# Patient Record
Sex: Female | Born: 1985 | Race: White | Hispanic: Yes | Smoking: Never smoker
Health system: Southern US, Community
[De-identification: ages and names within clinical notes are randomized; demographics above are authoritative.]

## PROBLEM LIST (undated history)

## (undated) DIAGNOSIS — F329 Major depressive disorder, single episode, unspecified: Secondary | ICD-10-CM

## (undated) DIAGNOSIS — R609 Edema, unspecified: Secondary | ICD-10-CM

## (undated) DIAGNOSIS — M549 Dorsalgia, unspecified: Secondary | ICD-10-CM

## (undated) DIAGNOSIS — F419 Anxiety disorder, unspecified: Secondary | ICD-10-CM

## (undated) DIAGNOSIS — F32A Depression, unspecified: Secondary | ICD-10-CM

## (undated) DIAGNOSIS — K6389 Other specified diseases of intestine: Secondary | ICD-10-CM

## (undated) DIAGNOSIS — G43909 Migraine, unspecified, not intractable, without status migrainosus: Secondary | ICD-10-CM

## (undated) DIAGNOSIS — M2550 Pain in unspecified joint: Secondary | ICD-10-CM

## (undated) DIAGNOSIS — N946 Dysmenorrhea, unspecified: Secondary | ICD-10-CM

## (undated) DIAGNOSIS — R7303 Prediabetes: Secondary | ICD-10-CM

## (undated) DIAGNOSIS — E7800 Pure hypercholesterolemia, unspecified: Secondary | ICD-10-CM

## (undated) DIAGNOSIS — E669 Obesity, unspecified: Secondary | ICD-10-CM

## (undated) HISTORY — DX: Dorsalgia, unspecified: M54.9

## (undated) HISTORY — DX: Pain in unspecified joint: M25.50

## (undated) HISTORY — DX: Obesity, unspecified: E66.9

## (undated) HISTORY — DX: Other specified diseases of intestine: K63.89

## (undated) HISTORY — DX: Depression, unspecified: F32.A

## (undated) HISTORY — DX: Prediabetes: R73.03

## (undated) HISTORY — DX: Migraine, unspecified, not intractable, without status migrainosus: G43.909

## (undated) HISTORY — DX: Edema, unspecified: R60.9

## (undated) HISTORY — DX: Pure hypercholesterolemia, unspecified: E78.00

## (undated) HISTORY — DX: Major depressive disorder, single episode, unspecified: F32.9

## (undated) HISTORY — DX: Anxiety disorder, unspecified: F41.9

## (undated) HISTORY — DX: Dysmenorrhea, unspecified: N94.6

## (undated) HISTORY — PX: WISDOM TOOTH EXTRACTION: SHX21

---
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
## 2008-09-11 ENCOUNTER — Encounter: Payer: Self-pay | Admitting: Obstetrics & Gynecology

## 2008-09-11 ENCOUNTER — Ambulatory Visit: Payer: Self-pay | Admitting: Obstetrics & Gynecology

## 2008-09-11 ENCOUNTER — Other Ambulatory Visit: Admission: RE | Admit: 2008-09-11 | Discharge: 2008-09-11 | Payer: Self-pay | Admitting: Obstetrics & Gynecology

## 2008-10-13 ENCOUNTER — Ambulatory Visit: Payer: Self-pay | Admitting: Obstetrics & Gynecology

## 2009-02-03 ENCOUNTER — Ambulatory Visit: Payer: Self-pay | Admitting: Obstetrics & Gynecology

## 2009-04-02 ENCOUNTER — Ambulatory Visit: Payer: Self-pay | Admitting: Obstetrics & Gynecology

## 2009-07-15 ENCOUNTER — Ambulatory Visit: Payer: Self-pay | Admitting: Obstetrics & Gynecology

## 2009-11-10 ENCOUNTER — Ambulatory Visit: Payer: Self-pay | Admitting: Obstetrics & Gynecology

## 2009-11-10 ENCOUNTER — Other Ambulatory Visit: Admission: RE | Admit: 2009-11-10 | Discharge: 2009-11-10 | Payer: Self-pay | Admitting: Obstetrics & Gynecology

## 2009-11-13 ENCOUNTER — Ambulatory Visit (HOSPITAL_COMMUNITY): Admission: RE | Admit: 2009-11-13 | Discharge: 2009-11-13 | Payer: Self-pay | Admitting: Family Medicine

## 2009-11-30 ENCOUNTER — Ambulatory Visit: Payer: Self-pay | Admitting: Obstetrics and Gynecology

## 2009-11-30 LAB — CONVERTED CEMR LAB
AST: 11 units/L (ref 0–37)
Albumin: 4 g/dL (ref 3.5–5.2)
Alkaline Phosphatase: 22 units/L — ABNORMAL LOW (ref 39–117)
BUN: 15 mg/dL (ref 6–23)
CO2: 24 meq/L (ref 19–32)
Chloride: 104 meq/L (ref 96–112)
Creatinine, Ser: 0.86 mg/dL (ref 0.40–1.20)
HCT: 38.8 % (ref 36.0–46.0)
LDL Cholesterol: 91 mg/dL (ref 0–99)
Potassium: 4.4 meq/L (ref 3.5–5.3)
RBC: 4.59 M/uL (ref 3.87–5.11)
RDW: 13.4 % (ref 11.5–15.5)
Sodium: 138 meq/L (ref 135–145)
Total Bilirubin: 0.2 mg/dL — ABNORMAL LOW (ref 0.3–1.2)
Total CHOL/HDL Ratio: 3.5
Total Protein: 6.6 g/dL (ref 6.0–8.3)
Triglycerides: 126 mg/dL (ref <150)
VLDL: 25 mg/dL (ref 0–40)
WBC: 6.5 10*3/uL (ref 4.0–10.5)

## 2010-10-19 NOTE — Assessment & Plan Note (Signed)
NAME:  [name], [name] NO.:  [id number]   MEDICAL RECORD NO.:  [id number]          PATIENT TYPE:  POB   LOCATION:  [facility]         FACILITY:  [facility]   PHYSICIAN:  Allie Bossier, MD        DATE OF BIRTH:  03/16/1986   DATE OF SERVICE:  09/11/2008                                  CLINIC NOTE   Dominique Davis is a 25 year old single white gravida 0 who is coming for her  first ever Pap smear.  She is slightly nervous.  Her concern today is  that she is interested in starting on the NuvaRing for menstrual  regulation and birth control.  She is currently using condoms faithfully  and continues to do so as she describes her periods as monthly, but the  date is not consistent.  She does have some pain with her periods as  well.   PAST MEDICAL HISTORY:  She is slightly overweight.   ALLERGIES:  No known drug allergies.   PHYSICAL EXAMINATION:  GENERAL:  Slightly overweight.  HEENT:  Normal.  HEART:  Regular rate and rhythm.  LUNGS:  Clear to auscultation bilaterally.  ABDOMEN:  No hepatosplenomegaly.  EXTERNAL GENITALIA:  No lesions.  Cervix; normal discharge and  nulliparous.  Uterus is midplane, nonmobile, but nontender.  Adnexa  nontender.  No masses.   ASSESSMENT AND PLAN:  1. Annual exam.  I have checked Pap smear with cervical cultures.      Recommended self-breast and self-vulvar exams monthly.  2. For menstrual regulation and birth control, I have placed a      NuvaRing and I will check her blood pressure in 4 weeks.  I feel      that her blood pressure today was due to her nervousness and I will      make sure that it is normal at her next visit.      Allie Bossier, MD     MCD/MEDQ  D:  09/11/2008  T:  09/11/2008  Job:  [id number]

## 2010-10-19 NOTE — Assessment & Plan Note (Signed)
NAME:  Dominique Davis, Dominique Davis NO.:  [id number]   MEDICAL RECORD NO.:  [id number]          PATIENT TYPE:  POB   LOCATION:  [facility]         FACILITY:  [facility]   PHYSICIAN:  Jaynie Collins, MD     DATE OF BIRTH:  [birth year]   DATE OF SERVICE:  02/03/2009                                  CLINIC NOTE   The patient is a 25 year old gravida 0 who is coming in for  contraceptive counseling.  The patient has been on NuvaRing since April,  and she was put on this because of a self-reported history of  menometrorrhagia.  The patient does say that her bleeding is regular  right now.  However, she has a significant amount of cramping,  especially during her menstrual period which is not regulated by the  NuvaRing.  The patient was to see if there is any other thing that she  can use for birth control that can also regulate her cramping better.  She also reports having premenstrual syndrome symptoms, mood swings and  other things that she feels are not alleviated by the NuvaRing.  The  patient did try to use the NuvaRing continuously for 3 months period,  but she is not satisfied with that anymore and wants to consider other  options.  She does use condoms for sexually transmitted infection  prevention and is going to be starting on the Gardasil series today  after this encounter.   Physical examination is deferred.  The patient's vitals are stable and  she is afebrile.  She has no interval change in medical or surgical  history, and her last Pap smear was in September 11, 2008, which was normal.   ASSESSMENT AND PLAN:  The patient is a 25 year old gravida 0 here for a  contraceptive counseling, different molds for contraception including  oral hormonal methods, Implanon, the IUD, Depo-Provera were discussed  with the patient and the risks and benefits of all these modalities were  also discussed with the patient.  She is interested in oral  contraceptive pills at this point.   She has normal blood pressure and  has no contraindications to continue oral therapy until she was  counseled regarding using an extended regimen of oral contraceptive pill  such as Seasonique.  Information was given to the patient about  Seasonique and she was written a prescription for this medication.  As  for her cramping, the patient was told that she can take NSAIDs to help  with the cramping.  It was recommended that she take ibuprofen 600 mg  every 6 hours and she could start it about 2 days prior to her period  and also for the first 2 days prior to period, not to exceed a 7-day  therapy and not to exceed more than 2400 mg of ibuprofen daily.  The  other option, she was given a prescription and this was to try  diclofenac DR 75 mg p.o. b.i.d., also taking during the same time frame  that would help with the inflammation during her period and help with  her dysmenorrhea.  The patient was told that if her pain does continue  after she continues with  the extended regimen of oral contraceptive  pills that she might need evaluation such as a diagnostic laparoscopy to  rule out endometriosis.  The patient does note that in starting an  extended regimen and for the first few months she might have irregular  spotting or bleeding and she was told that this was a normal side effect  of this regimen.  She would call or come back in if she has further pain  or any other gynecologic symptoms.  Her next annual examination is due  in April 2011.           ______________________________  Jaynie Collins, MD     UA/MEDQ  D:  02/03/2009  T:  02/04/2009  Job:  [id number]

## 2010-10-19 NOTE — Assessment & Plan Note (Signed)
NAME:  Dominique, Davis NO.:  [id number]   MEDICAL RECORD NO.:  [id number]          PATIENT TYPE:  POB   LOCATION:  [facility]         FACILITY:  [facility]   PHYSICIAN:  Allie Bossier, MD        DATE OF BIRTH:  [birth year]   DATE OF SERVICE:  11/10/2009                                  CLINIC NOTE   Dominique Davis is a 25 year old single white gravida 0 who comes in here for  an annual exam.  Her recurrent complaint is that when she has her  periods, she has pain.  Dr. Macon Large saw her last summer and started her  on Seasonique, so that she would only have a period every 3 months.  She  does only have a small period every 3 months, but she does describe some  PMS symptoms with this withdrawal bleed as well as pretty severe  cramping and pain.  We discussed at length that she does not have  bleeding when she is on the active pills and therefore she does not have  pain or her mood changes.   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  She has been monogamous for the last 2 years with  the boyfriend she brought to her pelvic exam today.  She denies  dyspareunia and she has had her Gardasil series.   PREVIOUS SURGERIES:  None.   FAMILY HISTORY:  Negative for breast, GYN, and colon malignancies, but  she is concerned because her mother and maternal aunt have fibroids.   No latex allergies.   No known drug allergies.   SOCIAL HISTORY:  She drinks alcohol on occasion, denies tobacco, or  illegal drug use.   MEDICATIONS:  Seasonique daily.  She denies other medicine use.   PHYSICAL EXAMINATION:  GENERAL:  Well-nourished, well-hydrated white  female with her boyfriend present for the history part of the exam, but  he was absent for the physical.  VITAL SIGNS:  Height 5 feet 7 inches, weight 192, blood pressure 123/82,  pulse 78.  HEENT:  Normal.  HEART:  Regular rate and rhythm.  LUNGS:  Clear to auscultation  bilaterally.  BREASTS:  Normal bilaterally.  ABDOMEN:  Obese.  No palpable hepatosplenomegaly.  EXTERNAL GENITALIA:  Shaved.  No lesions.  Please note that on exam, she  has nipple rings and piercing in her umbilicus.  Cervix nulliparous.  Normal discharge.  No cervical motion tenderness.  Uterus mid plane,  minimally mobile.  No masses.  Adnexa nontender.  No masses.   ASSESSMENT AND PLAN:  1. Annual exam.  I checked a Pap smear, recommended self-breast and      self vulvar exams.  I will check cultures along with the Pap smear.  2. Dysmenorrhea.  Since she only has her mood and physical symptoms      (pain) with the withdrawal bleed every 3 months, I have recommended      she avoid the withdrawal bleed by always taking an active pill.      She and her boyfriend understand this, do not have any questions,  and agreed to this plan.  Because of her concern about familial      fibroids and her history of pain, I have agreed to order a GYN      ultrasound.  If there are issues with this, I will call her.      Allie Bossier, MD     MCD/MEDQ  D:  11/10/2009  T:  11/10/2009  Job:  [id number]

## 2010-12-22 ENCOUNTER — Other Ambulatory Visit (HOSPITAL_COMMUNITY)
Admission: RE | Admit: 2010-12-22 | Discharge: 2010-12-22 | Disposition: A | Discharge status: Home / Self Care | Payer: Self-pay | Source: Ambulatory Visit | Attending: Obstetrics and Gynecology | Admitting: Obstetrics and Gynecology

## 2010-12-22 ENCOUNTER — Other Ambulatory Visit: Payer: Self-pay | Admitting: Adult Health

## 2010-12-22 DIAGNOSIS — Z01419 Encounter for gynecological examination (general) (routine) without abnormal findings: Secondary | ICD-10-CM | POA: Insufficient documentation

## 2010-12-22 DIAGNOSIS — Z113 Encounter for screening for infections with a predominantly sexual mode of transmission: Secondary | ICD-10-CM | POA: Insufficient documentation

## 2011-08-18 ENCOUNTER — Telehealth (HOSPITAL_COMMUNITY): Payer: Self-pay | Admitting: Dietician

## 2011-08-18 NOTE — Telephone Encounter (Signed)
Received referral from [facility] for dx: obesity on 08/16/11.

## 2011-08-25 NOTE — Telephone Encounter (Signed)
Sent letter to pt home via US Mail in attempt to contact pt to schedule appointment.  

## 2011-08-30 ENCOUNTER — Ambulatory Visit (INDEPENDENT_AMBULATORY_CARE_PROVIDER_SITE_OTHER): Payer: PRIVATE HEALTH INSURANCE | Admitting: Nurse Practitioner

## 2011-08-30 ENCOUNTER — Encounter: Payer: Self-pay | Admitting: Nurse Practitioner

## 2011-08-30 VITALS — BP 122/75 | HR 90 | Ht 67.0 in | Wt 204.0 lb

## 2011-08-30 DIAGNOSIS — B373 Candidiasis of vulva and vagina: Secondary | ICD-10-CM

## 2011-08-30 DIAGNOSIS — N390 Urinary tract infection, site not specified: Secondary | ICD-10-CM

## 2011-08-30 DIAGNOSIS — Z6829 Body mass index (BMI) 29.0-29.9, adult: Secondary | ICD-10-CM | POA: Insufficient documentation

## 2011-08-30 DIAGNOSIS — B3731 Acute candidiasis of vulva and vagina: Secondary | ICD-10-CM

## 2011-08-30 DIAGNOSIS — E669 Obesity, unspecified: Secondary | ICD-10-CM

## 2011-08-30 HISTORY — DX: Body mass index (BMI) 29.0-29.9, adult: Z68.29

## 2011-08-30 LAB — POCT URINALYSIS DIPSTICK
Bilirubin, UA: NEGATIVE
Glucose, UA: NEGATIVE
Spec Grav, UA: 1.015
Urobilinogen, UA: 0.2
pH, UA: 5

## 2011-08-30 MED ORDER — FLUCONAZOLE 100 MG PO TABS: 100.0000 mg | ORAL_TABLET | Freq: Every day | ORAL | Status: AC

## 2011-08-30 MED ORDER — CIPROFLOXACIN HCL 500 MG PO TABS: 500.0000 mg | ORAL_TABLET | Freq: Two times a day (BID) | ORAL | Status: AC

## 2011-08-30 NOTE — Patient Instructions (Signed)

## 2011-08-30 NOTE — Progress Notes (Signed)
S: Pt in office today for sx of UTI that have been ongoing x 5 days. C/O low abd discomfort, back discomfort, ? Chills. No new sexual partner. She is on continuous BCPs and is not having menses now. Last seen in this office 2 years ago. Did get her annual exam at new office in [place]. Usually gets vaginal yeast infection with antibiotics.  Labs: urine positive for blood. Culture will be done  O: well developed, well nourished caucasian female in NAD. Skin warm and dry. Not acutely ill. Positive for CVA tenderness only.  A: UTI Presumptive vaginal yeast  P: Cipro 500 mg bid x 3 days. Asked to increase fluids and not drink soda. Will give Diflucan for presumptive yeast. Asked to RTC in June/ July for annual exam

## 2011-09-01 NOTE — Telephone Encounter (Signed)
Sent letter to pt home via US Mail in attempt to contact pt to schedule appointment.  

## 2011-09-02 LAB — URINE CULTURE

## 2011-09-07 NOTE — Telephone Encounter (Signed)
Pt has not responded to attempts to contact to schedule appointment. Referral filed.  

## 2011-09-08 ENCOUNTER — Telehealth (HOSPITAL_COMMUNITY): Payer: Self-pay | Admitting: Dietician

## 2011-09-08 ENCOUNTER — Encounter (HOSPITAL_COMMUNITY): Payer: Self-pay | Admitting: Dietician

## 2011-09-08 NOTE — Progress Notes (Signed)
Sent appointment verification letter to pt home via US Mail.  

## 2011-09-08 NOTE — Telephone Encounter (Signed)
Appointment scheduled for 09/14/11 at 11:30 AM.

## 2011-09-14 ENCOUNTER — Encounter (HOSPITAL_COMMUNITY): Payer: Self-pay | Admitting: Dietician
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
## 2012-01-03 ENCOUNTER — Ambulatory Visit (INDEPENDENT_AMBULATORY_CARE_PROVIDER_SITE_OTHER): Payer: PRIVATE HEALTH INSURANCE | Admitting: Obstetrics and Gynecology

## 2012-01-03 VITALS — BP 110/64 | HR 73 | Ht 67.0 in | Wt 188.0 lb

## 2012-01-03 DIAGNOSIS — Z124 Encounter for screening for malignant neoplasm of cervix: Secondary | ICD-10-CM

## 2012-01-03 DIAGNOSIS — Z01419 Encounter for gynecological examination (general) (routine) without abnormal findings: Secondary | ICD-10-CM

## 2012-01-03 MED ORDER — NORETHIN-ETH ESTRAD-FE BIPHAS 1 MG-10 MCG / 10 MCG PO TABS: 1.0000 | ORAL_TABLET | Freq: Every day | ORAL | Status: DC

## 2012-01-03 NOTE — Progress Notes (Signed)
  Subjective:     Dominique Davis is a 26 y.o. female with BMI 29 and is here for a comprehensive physical exam. The patient reports no problems. Patient using Lo Loestrin for birth control and she is using it continuously. Patient is happy with her current method. She is a monogamous relationship and declined STD testing.  History   Social History  . Marital Status: Single    Spouse Name: N/A    Number of Children: N/A  . Years of Education: N/A   Occupational History  . Not on file.   Social History Main Topics  . Smoking status: Never Smoker   . Smokeless tobacco: Not on file  . Alcohol Use: Yes     rare  . Drug Use: No  . Sexually Active: Yes -- Female partner(s)    Birth Control/ Protection: Pill   Other Topics Concern  . Not on file   Social History Narrative  . No narrative on file   Health Maintenance  Topic Date Due  . Tetanus/tdap  12/22/2005  . Influenza Vaccine  03/06/2012  . Pap Smear  12/21/2013       Review of Systems A comprehensive review of systems was negative.   Objective:     GENERAL: Well-developed, well-nourished female in no acute distress.  HEENT: Normocephalic, atraumatic. Sclerae anicteric.  NECK: Supple. Normal thyroid.  LUNGS: Clear to auscultation bilaterally.  HEART: Regular rate and rhythm. BREASTS: Symmetric in size. No palpable masses or lymphadenopathy, skin changes, or nipple drainage. ABDOMEN: Soft, nontender, nondistended. No organomegaly. PELVIC: Normal external female genitalia. Vagina is pink and rugated.  Normal discharge. Normal appearing cervix. Uterus is normal in size. No adnexal mass or tenderness. EXTREMITIES: No cyanosis, clubbing, or edema, 2+ distal pulses.    Assessment:    Healthy female exam.      Plan:    Pap smear collected Patient advised to perform monthly self-breast and vulva exams Rx refill for OCP provided See After Visit Summary for Counseling Recommendations

## 2012-01-03 NOTE — Patient Instructions (Signed)
Preventive Care for Adults, Female A healthy lifestyle and preventive care can promote health and wellness. Preventive health guidelines for women include the following key practices.  A routine yearly physical is a good way to check with your caregiver about your health and preventive screening. It is a chance to share any concerns and updates on your health, and to receive a thorough exam.   Visit your dentist for a routine exam and preventive care every 6 months. Brush your teeth twice a day and floss once a day. Good oral hygiene prevents tooth decay and gum disease.   The frequency of eye exams is based on your age, health, family medical history, use of contact lenses, and other factors. Follow your caregiver's recommendations for frequency of eye exams.   Eat a healthy diet. Foods like vegetables, fruits, whole grains, low-fat dairy products, and lean protein foods contain the nutrients you need without too many calories. Decrease your intake of foods high in solid fats, added sugars, and salt. Eat the right amount of calories for you.Get information about a proper diet from your caregiver, if necessary.   Regular physical exercise is one of the most important things you can do for your health. Most adults should get at least 150 minutes of moderate-intensity exercise (any activity that increases your heart rate and causes you to sweat) each week. In addition, most adults need muscle-strengthening exercises on 2 or more days a week.   Maintain a healthy weight. The body mass index (BMI) is a screening tool to identify possible weight problems. It provides an estimate of body fat based on height and weight. Your caregiver can help determine your BMI, and can help you achieve or maintain a healthy weight.For adults 20 years and older:   A BMI below 18.5 is considered underweight.   A BMI of 18.5 to 24.9 is normal.   A BMI of 25 to 29.9 is considered overweight.   A BMI of 30 and above is  considered obese.   Maintain normal blood lipids and cholesterol levels by exercising and minimizing your intake of saturated fat. Eat a balanced diet with plenty of fruit and vegetables. Blood tests for lipids and cholesterol should begin at age 20 and be repeated every 5 years. If your lipid or cholesterol levels are high, you are over 50, or you are at high risk for heart disease, you may need your cholesterol levels checked more frequently.Ongoing high lipid and cholesterol levels should be treated with medicines if diet and exercise are not effective.   If you smoke, find out from your caregiver how to quit. If you do not use tobacco, do not start.   If you are pregnant, do not drink alcohol. If you are breastfeeding, be very cautious about drinking alcohol. If you are not pregnant and choose to drink alcohol, do not exceed 1 drink per day. One drink is considered to be 12 ounces (355 mL) of beer, 5 ounces (148 mL) of wine, or 1.5 ounces (44 mL) of liquor.   Avoid use of street drugs. Do not share needles with anyone. Ask for help if you need support or instructions about stopping the use of drugs.   High blood pressure causes heart disease and increases the risk of stroke. Your blood pressure should be checked at least every 1 to 2 years. Ongoing high blood pressure should be treated with medicines if weight loss and exercise are not effective.   If you are 55 to 25   years old, ask your caregiver if you should take aspirin to prevent strokes.   Diabetes screening involves taking a blood sample to check your fasting blood sugar level. This should be done once every 3 years, after age 45, if you are within normal weight and without risk factors for diabetes. Testing should be considered at a younger age or be carried out more frequently if you are overweight and have at least 1 risk factor for diabetes.   Breast cancer screening is essential preventive care for women. You should practice "breast  self-awareness." This means understanding the normal appearance and feel of your breasts and may include breast self-examination. Any changes detected, no matter how small, should be reported to a caregiver. Women in their 20s and 30s should have a clinical breast exam (CBE) by a caregiver as part of a regular health exam every 1 to 3 years. After age 40, women should have a CBE every year. Starting at age 40, women should consider having a mammography (breast X-ray test) every year. Women who have a family history of breast cancer should talk to their caregiver about genetic screening. Women at a high risk of breast cancer should talk to their caregivers about having magnetic resonance imaging (MRI) and a mammography every year.   The Pap test is a screening test for cervical cancer. A Pap test can show cell changes on the cervix that might become cervical cancer if left untreated. A Pap test is a procedure in which cells are obtained and examined from the lower end of the uterus (cervix).   Women should have a Pap test starting at age 21.   Between ages 21 and 29, Pap tests should be repeated every 2 years.   Beginning at age 30, you should have a Pap test every 3 years as long as the past 3 Pap tests have been normal.   Some women have medical problems that increase the chance of getting cervical cancer. Talk to your caregiver about these problems. It is especially important to talk to your caregiver if a new problem develops soon after your last Pap test. In these cases, your caregiver may recommend more frequent screening and Pap tests.   The above recommendations are the same for women who have or have not gotten the vaccine for human papillomavirus (HPV).   If you had a hysterectomy for a problem that was not cancer or a condition that could lead to cancer, then you no longer need Pap tests. Even if you no longer need a Pap test, a regular exam is a good idea to make sure no other problems are  starting.   If you are between ages 65 and 70, and you have had normal Pap tests going back 10 years, you no longer need Pap tests. Even if you no longer need a Pap test, a regular exam is a good idea to make sure no other problems are starting.   If you have had past treatment for cervical cancer or a condition that could lead to cancer, you need Pap tests and screening for cancer for at least 20 years after your treatment.   If Pap tests have been discontinued, risk factors (such as a new sexual partner) need to be reassessed to determine if screening should be resumed.   The HPV test is an additional test that may be used for cervical cancer screening. The HPV test looks for the virus that can cause the cell changes on the cervix.   The cells collected during the Pap test can be tested for HPV. The HPV test could be used to screen women aged 30 years and older, and should be used in women of any age who have unclear Pap test results. After the age of 30, women should have HPV testing at the same frequency as a Pap test.   Colorectal cancer can be detected and often prevented. Most routine colorectal cancer screening begins at the age of 50 and continues through age 75. However, your caregiver may recommend screening at an earlier age if you have risk factors for colon cancer. On a yearly basis, your caregiver may provide home test kits to check for hidden blood in the stool. Use of a small camera at the end of a tube, to directly examine the colon (sigmoidoscopy or colonoscopy), can detect the earliest forms of colorectal cancer. Talk to your caregiver about this at age 50, when routine screening begins. Direct examination of the colon should be repeated every 5 to 10 years through age 75, unless early forms of pre-cancerous polyps or small growths are found.   Hepatitis C blood testing is recommended for all people born from 1945 through 1965 and any individual with known risks for hepatitis C.    Practice safe sex. Use condoms and avoid high-risk sexual practices to reduce the spread of sexually transmitted infections (STIs). STIs include gonorrhea, chlamydia, syphilis, trichomonas, herpes, HPV, and human immunodeficiency virus (HIV). Herpes, HIV, and HPV are viral illnesses that have no cure. They can result in disability, cancer, and death. Sexually active women aged 25 and younger should be checked for chlamydia. Older women with new or multiple partners should also be tested for chlamydia. Testing for other STIs is recommended if you are sexually active and at increased risk.   Osteoporosis is a disease in which the bones lose minerals and strength with aging. This can result in serious bone fractures. The risk of osteoporosis can be identified using a bone density scan. Women ages 65 and over and women at risk for fractures or osteoporosis should discuss screening with their caregivers. Ask your caregiver whether you should take a calcium supplement or vitamin D to reduce the rate of osteoporosis.   Menopause can be associated with physical symptoms and risks. Hormone replacement therapy is available to decrease symptoms and risks. You should talk to your caregiver about whether hormone replacement therapy is right for you.   Use sunscreen with sun protection factor (SPF) of 30 or more. Apply sunscreen liberally and repeatedly throughout the day. You should seek shade when your shadow is shorter than you. Protect yourself by wearing long sleeves, pants, a wide-brimmed hat, and sunglasses year round, whenever you are outdoors.   Once a month, do a whole body skin exam, using a mirror to look at the skin on your back. Notify your caregiver of new moles, moles that have irregular borders, moles that are larger than a pencil eraser, or moles that have changed in shape or color.   Stay current with required immunizations.   Influenza. You need a dose every fall (or winter). The composition of  the flu vaccine changes each year, so being vaccinated once is not enough.   Pneumococcal polysaccharide. You need 1 to 2 doses if you smoke cigarettes or if you have certain chronic medical conditions. You need 1 dose at age 65 (or older) if you have never been vaccinated.   Tetanus, diphtheria, pertussis (Tdap, Td). Get 1 dose of   Tdap vaccine if you are younger than age 65, are over 65 and have contact with an infant, are a healthcare worker, are pregnant, or simply want to be protected from whooping cough. After that, you need a Td booster dose every 10 years. Consult your caregiver if you have not had at least 3 tetanus and diphtheria-containing shots sometime in your life or have a deep or dirty wound.   HPV. You need this vaccine if you are a woman age 26 or younger. The vaccine is given in 3 doses over 6 months.   Measles, mumps, rubella (MMR). You need at least 1 dose of MMR if you were born in 1957 or later. You may also need a second dose.   Meningococcal. If you are age 19 to 21 and a first-year college student living in a residence hall, or have one of several medical conditions, you need to get vaccinated against meningococcal disease. You may also need additional booster doses.   Zoster (shingles). If you are age 60 or older, you should get this vaccine.   Varicella (chickenpox). If you have never had chickenpox or you were vaccinated but received only 1 dose, talk to your caregiver to find out if you need this vaccine.   Hepatitis A. You need this vaccine if you have a specific risk factor for hepatitis A virus infection or you simply wish to be protected from this disease. The vaccine is usually given as 2 doses, 6 to 18 months apart.   Hepatitis B. You need this vaccine if you have a specific risk factor for hepatitis B virus infection or you simply wish to be protected from this disease. The vaccine is given in 3 doses, usually over 6 months.  Preventive Services /  Frequency Ages 19 to 39  Blood pressure check.** / Every 1 to 2 years.   Lipid and cholesterol check.** / Every 5 years beginning at age 20.   Clinical breast exam.** / Every 3 years for women in their 20s and 30s.   Pap test.** / Every 2 years from ages 21 through 29. Every 3 years starting at age 30 through age 65 or 70 with a history of 3 consecutive normal Pap tests.   HPV screening.** / Every 3 years from ages 30 through ages 65 to 70 with a history of 3 consecutive normal Pap tests.   Hepatitis C blood test.** / For any individual with known risks for hepatitis C.   Skin self-exam. / Monthly.   Influenza immunization.** / Every year.   Pneumococcal polysaccharide immunization.** / 1 to 2 doses if you smoke cigarettes or if you have certain chronic medical conditions.   Tetanus, diphtheria, pertussis (Tdap, Td) immunization. / A one-time dose of Tdap vaccine. After that, you need a Td booster dose every 10 years.   HPV immunization. / 3 doses over 6 months, if you are 26 and younger.   Measles, mumps, rubella (MMR) immunization. / You need at least 1 dose of MMR if you were born in 1957 or later. You may also need a second dose.   Meningococcal immunization. / 1 dose if you are age 19 to 21 and a first-year college student living in a residence hall, or have one of several medical conditions, you need to get vaccinated against meningococcal disease. You may also need additional booster doses.   Varicella immunization.** / Consult your caregiver.   Hepatitis A immunization.** / Consult your caregiver. 2 doses, 6 to 18 months   apart.   Hepatitis B immunization.** / Consult your caregiver. 3 doses usually over 6 months.    ** Family history and personal history of risk and conditions may change your caregiver's recommendations. Document Released: 07/19/2001 Document Revised: 05/12/2011 Document Reviewed: 10/18/2010 [employer] Patient Information 2012 [employer]. 

## 2012-12-24 ENCOUNTER — Telehealth: Payer: Self-pay | Admitting: *Deleted

## 2012-12-24 NOTE — Telephone Encounter (Signed)
Pt called and is having symptoms of a yeast infection.  Feeling very itchy and having white discharge.  I called in Diflucan 150 mg tablets-take one and repeat in 2-3 days if symptoms persist.  #2 with no refills.  Pt has scheduled a physical for 01/07/13.

## 2013-01-07 ENCOUNTER — Encounter: Payer: Self-pay | Admitting: Obstetrics & Gynecology

## 2013-01-07 ENCOUNTER — Ambulatory Visit (INDEPENDENT_AMBULATORY_CARE_PROVIDER_SITE_OTHER): Payer: BC Managed Care – PPO | Admitting: Obstetrics & Gynecology

## 2013-01-07 VITALS — BP 122/91 | HR 69 | Ht 67.0 in | Wt 201.0 lb

## 2013-01-07 DIAGNOSIS — Z0000 Encounter for general adult medical examination without abnormal findings: Secondary | ICD-10-CM

## 2013-01-07 DIAGNOSIS — Z1151 Encounter for screening for human papillomavirus (HPV): Secondary | ICD-10-CM

## 2013-01-07 DIAGNOSIS — Z01419 Encounter for gynecological examination (general) (routine) without abnormal findings: Secondary | ICD-10-CM

## 2013-01-07 DIAGNOSIS — Z124 Encounter for screening for malignant neoplasm of cervix: Secondary | ICD-10-CM

## 2013-01-07 DIAGNOSIS — Z3041 Encounter for surveillance of contraceptive pills: Secondary | ICD-10-CM

## 2013-01-07 NOTE — Progress Notes (Signed)
Subjective:    Dominique Davis is a 27 y.o. female who presents for an annual exam. She is concerned about weight gain (20# in last year).  The patient is sexually active. GYN screening history: last pap: was normal. The patient wears seatbelts: yes. The patient participates in regular exercise: not asked. Has the patient ever been transfused or tattooed?: yes. The patient reports that there is not domestic violence in her life.   Menstrual History: OB History   Grav Para Term Preterm Abortions TAB SAB Ect Mult Living   0 0 0 0 0 0 0 0 0 0        No LMP recorded. Patient is not currently having periods (Reason: Oral contraceptives).    The following portions of the patient's history were reviewed and updated as appropriate: allergies, current medications, past family history, past medical history, past social history, past surgical history and problem list.  Review of Systems A comprehensive review of systems was negative. Lives with her boyfriend of 5 years, she is in school at [employer], graduating this December. She works as a Building surveyor.   Objective:    BP 122/91  Pulse 69  Ht 5\' 7"  (1.702 m)  Wt 201 lb (91.173 kg)  BMI 31.47 kg/m2  General Appearance:    Alert, cooperative, no distress, appears stated age  Head:    Normocephalic, without obvious abnormality, atraumatic  Eyes:    PERRL, conjunctiva/corneas clear, EOM's intact, fundi    benign, both eyes  Ears:    Normal TM's and external ear canals, both ears  Nose:   Nares normal, septum midline, mucosa normal, no drainage    or sinus tenderness  Throat:   Lips, mucosa, and tongue normal; teeth and gums normal  Neck:   Supple, symmetrical, trachea midline, no adenopathy;    thyroid:  no enlargement/tenderness/nodules; no carotid   bruit or JVD  Back:     Symmetric, no curvature, ROM normal, no CVA tenderness  Lungs:     Clear to auscultation bilaterally, respirations unlabored  Chest Wall:    No  tenderness or deformity   Heart:    Regular rate and rhythm, S1 and S2 normal, no murmur, rub   or gallop  Breast Exam:    No tenderness, masses, or nipple abnormality  Abdomen:     Soft, non-tender, bowel sounds active all four quadrants,    no masses, no organomegaly  Genitalia:    Normal female without lesion, discharge or tenderness, NSSA, NT, normal adnexal exam     Extremities:   Extremities normal, atraumatic, no cyanosis or edema  Pulses:   2+ and symmetric all extremities  Skin:   Skin color, texture, turgor normal, no rashes or lesions  Lymph nodes:   Cervical, supraclavicular, and axillary nodes normal  Neurologic:   CNII-XII intact, normal strength, sensation and reflexes    throughout  .    Assessment:    Healthy female exam.  Weight gain   Plan:     Breast self exam technique reviewed and patient encouraged to perform self-exam monthly. Thin prep Pap smear.  RTC for fasting labs and TSH

## 2013-02-18 ENCOUNTER — Telehealth: Payer: Self-pay | Admitting: *Deleted

## 2013-02-18 DIAGNOSIS — IMO0001 Reserved for inherently not codable concepts without codable children: Secondary | ICD-10-CM

## 2013-02-18 MED ORDER — NORETHIN-ETH ESTRAD-FE BIPHAS 1 MG-10 MCG / 10 MCG PO TABS: 1.0000 | ORAL_TABLET | Freq: Every day | ORAL | Status: DC

## 2013-02-18 NOTE — Telephone Encounter (Signed)
Patient needs a years refill of her ocp.  Meds called to her pharmacy.

## 2013-03-11 ENCOUNTER — Other Ambulatory Visit (INDEPENDENT_AMBULATORY_CARE_PROVIDER_SITE_OTHER): Payer: BC Managed Care – PPO | Admitting: *Deleted

## 2013-03-11 DIAGNOSIS — Z833 Family history of diabetes mellitus: Secondary | ICD-10-CM

## 2013-03-11 DIAGNOSIS — Z1329 Encounter for screening for other suspected endocrine disorder: Secondary | ICD-10-CM

## 2013-03-11 LAB — HEMOGLOBIN A1C: Mean Plasma Glucose: 105 mg/dL (ref <117)

## 2013-04-11 ENCOUNTER — Other Ambulatory Visit: Payer: Self-pay

## 2013-04-26 ENCOUNTER — Ambulatory Visit (INDEPENDENT_AMBULATORY_CARE_PROVIDER_SITE_OTHER): Payer: BC Managed Care – PPO | Admitting: Family Medicine

## 2013-04-26 ENCOUNTER — Encounter: Payer: Self-pay | Admitting: Family Medicine

## 2013-04-26 VITALS — BP 122/68 | HR 84 | Temp 98.7°F | Resp 18 | Ht 66.0 in | Wt 205.0 lb

## 2013-04-26 DIAGNOSIS — G43909 Migraine, unspecified, not intractable, without status migrainosus: Secondary | ICD-10-CM

## 2013-04-26 DIAGNOSIS — M549 Dorsalgia, unspecified: Secondary | ICD-10-CM

## 2013-04-26 DIAGNOSIS — F411 Generalized anxiety disorder: Secondary | ICD-10-CM

## 2013-04-26 DIAGNOSIS — Z23 Encounter for immunization: Secondary | ICD-10-CM

## 2013-04-26 DIAGNOSIS — E669 Obesity, unspecified: Secondary | ICD-10-CM

## 2013-04-26 DIAGNOSIS — G8929 Other chronic pain: Secondary | ICD-10-CM

## 2013-04-26 MED ORDER — FLUTICASONE PROPIONATE 50 MCG/ACT NA SUSP: 2.0000 | Freq: Every day | NASAL | Status: DC

## 2013-04-26 MED ORDER — SUMATRIPTAN SUCCINATE 50 MG PO TABS: 50.0000 mg | ORAL_TABLET | ORAL | Status: DC | PRN

## 2013-04-26 MED ORDER — KETOROLAC TROMETHAMINE 60 MG/2ML IM SOLN
60.0000 mg | Freq: Once | INTRAMUSCULAR | Status: AC
  Administered 2013-04-26: 60 mg via INTRAMUSCULAR

## 2013-04-26 MED ORDER — PHENTERMINE HCL 37.5 MG PO TABS: 37.5000 mg | ORAL_TABLET | Freq: Every day | ORAL | Status: DC

## 2013-04-26 NOTE — Patient Instructions (Signed)
Try the phentermine start with 1/2 tablets for 2 weeks then increase to 1 tablet Flonase for allergies/rhinitis Imitrex as needed for migraine F/U 6 weeks -morning appt fasting

## 2013-04-27 ENCOUNTER — Encounter: Payer: Self-pay | Admitting: Family Medicine

## 2013-04-27 DIAGNOSIS — F411 Generalized anxiety disorder: Secondary | ICD-10-CM | POA: Insufficient documentation

## 2013-04-27 DIAGNOSIS — G43909 Migraine, unspecified, not intractable, without status migrainosus: Secondary | ICD-10-CM | POA: Insufficient documentation

## 2013-04-27 DIAGNOSIS — M549 Dorsalgia, unspecified: Secondary | ICD-10-CM | POA: Insufficient documentation

## 2013-04-27 DIAGNOSIS — G8929 Other chronic pain: Secondary | ICD-10-CM | POA: Insufficient documentation

## 2013-04-27 NOTE — Progress Notes (Signed)
  Subjective:    Patient ID: Dominique Davis, female    DOB: [birth year], 27 y.o.   MRN: [id number]  HPI  Patient here to establish care. She recently had a GYN physical done which was benign. She's not had a PCP since she was a teenager. Is concerned today about her headaches as well as her weight. She is being followed by chiropractor secondary to right lower back pain she was told she had acute scoliosis and a bulging disc. She's been followed by [facility] in [place]  Obese-he's currently at her heaviest weight- lowest weight was 170 pounds back in 2010. She's tried exercise on a regular basis as well as multiple diets. She's currently on the pain we have diet which he states has worked well for her and also helps her migraines. She feels that she needs to push and would like to start medication to help with her weight loss. She has had some baseline labs including a TSH which was normal  Migraine headache migraine headache for the past couple of days. She is history of migraines in the past she used medication. She initially thought this was sinuses therefore take Advil and Alka-Seltzer but had minimal improvement. She feels more pressure behind her eyes and a pounding headache. She's not had any change in her vision.-   Review of Systems  GEN- denies fatigue, fever, weight loss,weakness, recent illness HEENT- denies eye drainage, change in vision, nasal discharge, CVS- denies chest pain, palpitations RESP- denies SOB, cough, wheeze ABD- denies N/V, change in stools, abd pain GU- denies dysuria, hematuria, dribbling, incontinence MSK- denies joint pain, muscle aches, injury Neuro- +headache, dizziness, syncope, seizure activity      Objective:   Physical Exam GEN- NAD, alert and oriented x3, piercings noted HEENT- PERRL, EOMI, non injected sclera, pink conjunctiva, MMM, oropharynx clear Neck- Supple, no thyromegaly CVS- RRR, no murmur RESP-CTAB EXT- No  edema Pulses- Radial, DP-2+ Psych- Normal affect and mood        Assessment & Plan:

## 2013-04-27 NOTE — Assessment & Plan Note (Signed)
He has suffered from anxiety and depression over the years. She's not on any medications. She is very adverse to this because of her mothers history and use of multiple medications. At this time we'll follow her closely

## 2013-04-27 NOTE — Assessment & Plan Note (Signed)
She's been given a shot of Toradol today. I've also given her prescription for Imitrex to use as needed

## 2013-04-27 NOTE — Assessment & Plan Note (Signed)
Obtain records from chiropracter

## 2013-04-27 NOTE — Assessment & Plan Note (Signed)
Phentermine. She will start with half a tablet and then increase to one tablet after 2 weeks. She's to continue her pale he is diet and start an exercise routine. Discussed side effects of the medication Fasting labs will be done at her next visit

## 2013-06-07 ENCOUNTER — Ambulatory Visit: Payer: BC Managed Care – PPO | Admitting: Family Medicine

## 2013-06-14 ENCOUNTER — Encounter: Payer: Self-pay | Admitting: Family Medicine

## 2013-06-14 ENCOUNTER — Ambulatory Visit (INDEPENDENT_AMBULATORY_CARE_PROVIDER_SITE_OTHER): Payer: BC Managed Care – PPO | Admitting: Family Medicine

## 2013-06-14 VITALS — BP 110/60 | HR 76 | Temp 98.0°F | Resp 16 | Ht 67.0 in | Wt 198.0 lb

## 2013-06-14 DIAGNOSIS — Z13220 Encounter for screening for lipoid disorders: Secondary | ICD-10-CM

## 2013-06-14 DIAGNOSIS — G43909 Migraine, unspecified, not intractable, without status migrainosus: Secondary | ICD-10-CM

## 2013-06-14 DIAGNOSIS — E669 Obesity, unspecified: Secondary | ICD-10-CM

## 2013-06-14 LAB — COMPLETE METABOLIC PANEL WITH GFR
ALT: 9 U/L (ref 0–35)
AST: 13 U/L (ref 0–37)
Albumin: 3.8 g/dL (ref 3.5–5.2)
Alkaline Phosphatase: 33 U/L — ABNORMAL LOW (ref 39–117)
BILIRUBIN TOTAL: 0.4 mg/dL (ref 0.3–1.2)
BUN: 14 mg/dL (ref 6–23)
CALCIUM: 9 mg/dL (ref 8.4–10.5)
CO2: 25 mEq/L (ref 19–32)
CREATININE: 0.93 mg/dL (ref 0.50–1.10)
Chloride: 104 mEq/L (ref 96–112)
GFR, EST NON AFRICAN AMERICAN: 85 mL/min
GFR, Est African American: >89 mL/min
Glucose, Bld: 90 mg/dL (ref 70–99)
Potassium: 4.4 mEq/L (ref 3.5–5.3)
Sodium: 139 mEq/L (ref 135–145)
Total Protein: 6.6 g/dL (ref 6.0–8.3)

## 2013-06-14 LAB — LIPID PANEL
CHOL/HDL RATIO: 3.1 ratio
Cholesterol: 170 mg/dL (ref 0–200)
HDL: 54 mg/dL (ref >39)
LDL Cholesterol: 101 mg/dL — ABNORMAL HIGH (ref 0–99)
Triglycerides: 75 mg/dL (ref <150)
VLDL: 15 mg/dL (ref 0–40)

## 2013-06-14 LAB — CBC WITH DIFFERENTIAL/PLATELET
BASOS PCT: 1 % (ref 0–1)
Basophils Absolute: 0 10*3/uL (ref 0.0–0.1)
Eosinophils Absolute: 0.2 10*3/uL (ref 0.0–0.7)
Eosinophils Relative: 3 % (ref 0–5)
HEMATOCRIT: 43.1 % (ref 36.0–46.0)
HEMOGLOBIN: 14.4 g/dL (ref 12.0–15.0)
LYMPHS PCT: 33 % (ref 12–46)
Lymphs Abs: 1.5 10*3/uL (ref 0.7–4.0)
MCH: 28.3 pg (ref 26.0–34.0)
MCHC: 33.4 g/dL (ref 30.0–36.0)
MCV: 84.8 fL (ref 78.0–100.0)
MONO ABS: 0.3 10*3/uL (ref 0.1–1.0)
Monocytes Relative: 7 % (ref 3–12)
NEUTROS ABS: 2.5 10*3/uL (ref 1.7–7.7)
NEUTROS PCT: 56 % (ref 43–77)
PLATELETS: 253 10*3/uL (ref 150–400)
RBC: 5.08 MIL/uL (ref 3.87–5.11)
RDW: 13.1 % (ref 11.5–15.5)
WBC: 4.4 10*3/uL (ref 4.0–10.5)

## 2013-06-14 NOTE — Progress Notes (Signed)
   Subjective:    Patient ID: Dominique Davis, female    DOB: 06-11-1985, 28 y.o.   MRN: [id number]  HPI Please see the last office visit. The patient has been taking phentermine for approximately one month. She has lost 7 pounds since taking phentermine. She is exercising 5-6 days a week for one to 2 hours a day. She is trying to consent 1300-1500 calories per day. Overall she is doing well. She continues to experience migraines. She reports that they occur 4-5 times per month.  She has had them since childhood. They tend to be triggered by stress. Past Medical History  Diagnosis Date  . Dysmenorrhea   . Anxiety   . Obesity    Current Outpatient Prescriptions on File Prior to Visit  Medication Sig Dispense Refill  . fluticasone (FLONASE) 50 MCG/ACT nasal spray Place 2 sprays into both nostrils daily.  16 g  2  . Norethindrone-Ethinyl Estradiol-Fe Biphas (LO LOESTRIN FE) 1 MG-10 MCG / 10 MCG tablet Take 1 tablet by mouth daily.  1 Package  12  . phentermine (ADIPEX-P) 37.5 MG tablet Take 1 tablet (37.5 mg total) by mouth daily before breakfast.  30 tablet  1  . SUMAtriptan (IMITREX) 50 MG tablet Take 1 tablet (50 mg total) by mouth every 2 (two) hours as needed for migraine or headache. May repeat in 2 hours if headache persists or recurs.  10 tablet  0   No current facility-administered medications on file prior to visit.   No Known Allergies History   Social History  . Marital Status: Single    Spouse Name: N/A    Number of Children: N/A  . Years of Education: N/A   Occupational History  . Not on file.   Social History Main Topics  . Smoking status: Never Smoker   . Smokeless tobacco: Never Used  . Alcohol Use: Yes     Comment: rare  . Drug Use: No  . Sexual Activity: Yes    Partners: Male    Birth Control/ Protection: Pill   Other Topics Concern  . Not on file   Social History Narrative  . No narrative on file      Review of Systems  All other systems reviewed  and are negative.       Objective:   Physical Exam  Vitals reviewed. Constitutional: She is oriented to person, place, and time.  Neck: No thyromegaly present.  Cardiovascular: Normal rate, regular rhythm and normal heart sounds.  Exam reveals no friction rub.   No murmur heard. Pulmonary/Chest: Effort normal and breath sounds normal. No respiratory distress. She has no wheezes. She has no rales.  Abdominal: Soft. Bowel sounds are normal.  Neurological: She is alert and oriented to person, place, and time. She has normal reflexes. She displays normal reflexes. No cranial nerve deficit. She exhibits normal muscle tone. Coordination normal.          Assessment & Plan:  1. Screening cholesterol level - CBC with Differential - COMPLETE METABOLIC PANEL WITH GFR - Lipid panel  2. Obesity, unspecified Continue phentermine for a total of 3 months. I encouraged the patient to continue 5 days a week of one hour a day exercise and a 1500-calorie per day diet  3. Migraines Consider adding Topamax for migraine prevention and to assist with weight loss.  Patient would like to research this option and will consider it in the future.

## 2013-06-19 ENCOUNTER — Encounter: Payer: Self-pay | Admitting: Family Medicine

## 2013-06-21 ENCOUNTER — Telehealth: Payer: Self-pay | Admitting: Family Medicine

## 2013-06-21 NOTE — Telephone Encounter (Signed)
Pt had researched the medicationTopamax like she was going to do and she does have a few questions regarding the medication Call back number is [phone]

## 2013-06-24 NOTE — Telephone Encounter (Signed)
Pt is concerned about all the side effects that comes with taking Topamax which includes metabolic acidosis, reducing the effectiveness of birth control (and what should she do while on medication - double up on it?), Seizures after stopping the medication, ETC. She is wanting to know if the side effects out weight the benefit of the medication and wants to make sure that it is safe for her to take!?!?

## 2013-06-24 NOTE — Telephone Encounter (Signed)
LMTRC

## 2013-06-24 NOTE — Telephone Encounter (Signed)
Forward to PCP.

## 2013-06-25 NOTE — Telephone Encounter (Signed)
The risk for seizures on discontinuation applies only to patients who take the medicine to prevent seizures.  She would be fine.  The risk of metabolic acidosis is extremely low, easy to see on basic labwork and if it does occur correctable by discontinuing medicine.  The only real concern is the medicine slightly lowering the effectiveness of OCP's.  To be completely safe, she could use a back up method of bc such as condoms although I feel the risk is low.

## 2013-06-26 NOTE — Telephone Encounter (Signed)
LMTRC

## 2013-06-27 NOTE — Telephone Encounter (Signed)
Pt is returning your call  Call back number is [phone]

## 2013-06-28 NOTE — Telephone Encounter (Signed)
Call back number is incorrect. But pt is aware of the responses to her questions.

## 2013-08-13 ENCOUNTER — Ambulatory Visit (INDEPENDENT_AMBULATORY_CARE_PROVIDER_SITE_OTHER): Payer: BC Managed Care – PPO | Admitting: Family Medicine

## 2013-08-13 ENCOUNTER — Encounter: Payer: Self-pay | Admitting: Family Medicine

## 2013-08-13 VITALS — BP 110/64 | HR 78 | Temp 98.8°F | Resp 16 | Wt 198.0 lb

## 2013-08-13 DIAGNOSIS — G43909 Migraine, unspecified, not intractable, without status migrainosus: Secondary | ICD-10-CM

## 2013-08-13 DIAGNOSIS — R197 Diarrhea, unspecified: Secondary | ICD-10-CM

## 2013-08-13 MED ORDER — RIZATRIPTAN BENZOATE 10 MG PO TABS: 10.0000 mg | ORAL_TABLET | ORAL | Status: DC | PRN

## 2013-08-13 MED ORDER — TOPIRAMATE 25 MG PO TABS: 50.0000 mg | ORAL_TABLET | Freq: Two times a day (BID) | ORAL | Status: DC

## 2013-08-13 NOTE — Progress Notes (Signed)
Subjective:    Patient ID: Dominique Davis, female    DOB: 1986/01/19, 28 y.o.   MRN: [id number]  HPI 06/14/13 Please see the last office visit. The patient has been taking phentermine for approximately one month. She has lost 7 pounds since taking phentermine. She is exercising 5-6 days a week for one to 2 hours a day. She is trying to consent 1300-1500 calories per day. Overall she is doing well. She continues to experience migraines. She reports that they occur 4-5 times per month.  She has had them since childhood. They tend to be triggered by stress.  At that time, my plan was: 1. Screening cholesterol level - CBC with Differential - COMPLETE METABOLIC PANEL WITH GFR - Lipid panel  2. Obesity, unspecified Continue phentermine for a total of 3 months. I encouraged the patient to continue 5 days a week of one hour a day exercise and a 1500-calorie per day diet  3. Migraines Consider adding Topamax for migraine prevention and to assist with weight loss.  Patient would like to research this option and will consider it in the future.   08/13/13 Patient is here today for followup. She continues to complain of frequent migraines. They occur 4-5 times per month. They're pulsatile unilateral and severe. They're unrelieved by Imitrex ibuprofen or Excedrin Migraine. She is now interested in trying Topamax for prevention. She also complains of epigastric abdominal pain whenever she eats gluten-containing food. Past Medical History  Diagnosis Date  . Dysmenorrhea   . Anxiety   . Obesity    Current Outpatient Prescriptions on File Prior to Visit  Medication Sig Dispense Refill  . Norethindrone-Ethinyl Estradiol-Fe Biphas (LO LOESTRIN FE) 1 MG-10 MCG / 10 MCG tablet Take 1 tablet by mouth daily.  1 Package  12  . SUMAtriptan (IMITREX) 50 MG tablet Take 1 tablet (50 mg total) by mouth every 2 (two) hours as needed for migraine or headache. May repeat in 2 hours if headache persists or recurs.  10  tablet  0  . fluticasone (FLONASE) 50 MCG/ACT nasal spray Place 2 sprays into both nostrils daily.  16 g  2   No current facility-administered medications on file prior to visit.   No Known Allergies History   Social History  . Marital Status: Single    Spouse Name: N/A    Number of Children: N/A  . Years of Education: N/A   Occupational History  . Not on file.   Social History Main Topics  . Smoking status: Never Smoker   . Smokeless tobacco: Never Used  . Alcohol Use: Yes     Comment: rare  . Drug Use: No  . Sexual Activity: Yes    Partners: Male    Birth Control/ Protection: Pill   Other Topics Concern  . Not on file   Social History Narrative  . No narrative on file      Review of Systems  All other systems reviewed and are negative.       Objective:   Physical Exam  Vitals reviewed. Constitutional: She is oriented to person, place, and time.  Neck: No thyromegaly present.  Cardiovascular: Normal rate, regular rhythm and normal heart sounds.  Exam reveals no friction rub.   No murmur heard. Pulmonary/Chest: Effort normal and breath sounds normal. No respiratory distress. She has no wheezes. She has no rales.  Abdominal: Soft. Bowel sounds are normal.  Neurological: She is alert and oriented to person, place, and time. She has  normal reflexes. No cranial nerve deficit. She exhibits normal muscle tone. Coordination normal.          Assessment & Plan:  1. Migraines Begin Topamax 25 mg by mouth each bedtime. I want the patient to increase by 25 mg every week until she is taking 50 mg by mouth twice a day. I will about the medication into, metabolic acidosis. I recheck in one month. She also used Maxalt as needed for migraines. - topiramate (TOPAMAX) 25 MG tablet; Take 2 tablets (50 mg total) by mouth 2 (two) times daily.  Dispense: 120 tablet; Refill: 3 - rizatriptan (MAXALT) 10 MG tablet; Take 1 tablet (10 mg total) by mouth as needed for migraine. May  repeat in 2 hours if needed  Dispense: 10 tablet; Refill: 0  2. Diarrhea - Celiac panel 10

## 2013-08-14 LAB — CELIAC PANEL 10
ENDOMYSIAL SCREEN: NEGATIVE
GLIADIN IGG: 46.8 U/mL — AB (ref <20)
Gliadin IgA: 8.8 U/mL (ref <20)
IGA: 173 mg/dL (ref 69–380)
TISSUE TRANSGLUT AB: 8.7 U/mL (ref <20)
TISSUE TRANSGLUTAMINASE AB, IGA: 2.7 U/mL (ref <20)

## 2013-08-26 ENCOUNTER — Telehealth: Payer: Self-pay | Admitting: Family Medicine

## 2013-08-26 MED ORDER — FLUCONAZOLE 150 MG PO TABS: 150.0000 mg | ORAL_TABLET | Freq: Once | ORAL | Status: DC

## 2013-08-26 NOTE — Telephone Encounter (Signed)
Call back number is [phone] Pharmacy [facility] Pt is wanting valium for a yeast infection. Please call pt to let her know if something could be called in.

## 2013-08-26 NOTE — Telephone Encounter (Signed)
Okay to give Dilfucan 150mg  x 1, repeat in 3 days She can increase water, yogurt with live cultures No douching Decrease sugar intake

## 2013-08-26 NOTE — Telephone Encounter (Signed)
Call placed to patient and patient made aware.   Prescription sent to pharmacy.  

## 2013-08-26 NOTE — Telephone Encounter (Signed)
Call placed to patient.   States she thinks she has yeast infection and has history of chronic yeast infections.   States that she is gluten sensitive and sensitive to dairy.   Would like MD to advise other preventative measures to reduce amount of yeast infections.   Also requested MD to approve Diflucan.   MD please advise.

## 2013-09-23 ENCOUNTER — Telehealth: Payer: Self-pay | Admitting: Family Medicine

## 2013-09-23 NOTE — Telephone Encounter (Signed)
FYI - Pt called to let you know that she is currently taking Topamax 25mg  bid and she has not had any migraines so she is going to stay at that dose. She is having some nausea and occasional diarrhea but i assured her that is normal with that medication and if it gets worse to please give us a call otherwise try to take with food. Pt verbalized understanding.

## 2013-10-14 ENCOUNTER — Telehealth: Payer: Self-pay | Admitting: *Deleted

## 2013-10-14 MED ORDER — MELOXICAM 7.5 MG PO TABS: 7.5000 mg | ORAL_TABLET | Freq: Every day | ORAL | Status: DC

## 2013-10-14 NOTE — Telephone Encounter (Signed)
Start Meloxicam 7.5mg  once a a day #30 R 0  Have her make an appt about her back pain, I have not seen her since November

## 2013-10-14 NOTE — Telephone Encounter (Signed)
Call returned to patient.   Appointment scheduled.   Prescription sent to pharmacy.

## 2013-10-14 NOTE — Telephone Encounter (Signed)
Received call from Myra, nurse with [facility] in [place], [place].   Reports that patient was seen in their clinic today and reports increase in pain. States that she has been unable to sleep d/t pain.   Requested medication from chiropractor. Referred to PCP.   MD please advise.

## 2013-10-15 ENCOUNTER — Other Ambulatory Visit: Payer: Self-pay | Admitting: Family Medicine

## 2013-10-15 ENCOUNTER — Telehealth: Payer: Self-pay | Admitting: *Deleted

## 2013-10-15 NOTE — Telephone Encounter (Signed)
Pt is aware needs NTBS before sending to specialist, pt has appt to [facility] tomorrow she will address at that time.

## 2013-10-15 NOTE — Telephone Encounter (Signed)
Pt is stating she is still have issues with her stomach and wants a referral to see Gastroenterologist to check for diverticulitis,etc. Ok to send referral?

## 2013-10-15 NOTE — Telephone Encounter (Signed)
Regarding what, the only gi complaint I can see is a mention of epigastric discomfort when she eats gluten containing foods.  NTBS here first.  May not require a specialist.

## 2013-10-16 ENCOUNTER — Encounter: Payer: Self-pay | Admitting: Family Medicine

## 2013-10-16 ENCOUNTER — Ambulatory Visit (INDEPENDENT_AMBULATORY_CARE_PROVIDER_SITE_OTHER): Payer: BC Managed Care – PPO | Admitting: Family Medicine

## 2013-10-16 VITALS — BP 120/64 | HR 68 | Temp 98.0°F | Resp 14 | Ht 66.0 in | Wt 190.0 lb

## 2013-10-16 DIAGNOSIS — K9041 Non-celiac gluten sensitivity: Secondary | ICD-10-CM | POA: Insufficient documentation

## 2013-10-16 DIAGNOSIS — Z9109 Other allergy status, other than to drugs and biological substances: Secondary | ICD-10-CM

## 2013-10-16 DIAGNOSIS — K900 Celiac disease: Secondary | ICD-10-CM

## 2013-10-16 DIAGNOSIS — M549 Dorsalgia, unspecified: Secondary | ICD-10-CM

## 2013-10-16 DIAGNOSIS — G8929 Other chronic pain: Secondary | ICD-10-CM

## 2013-10-16 MED ORDER — MELOXICAM 7.5 MG PO TABS: 7.5000 mg | ORAL_TABLET | Freq: Every day | ORAL | Status: DC

## 2013-10-16 NOTE — Assessment & Plan Note (Signed)
Referral to gastroenterology for further workup. At this point she is staying away from gluten products

## 2013-10-16 NOTE — Assessment & Plan Note (Signed)
Multiple environmental allergies as well as seasonal allergies. Will for her to allergist for testing. I query if this has something to do with her abdominal pain gluten intolerance

## 2013-10-16 NOTE — Assessment & Plan Note (Signed)
I think this is muscle skeletal pain. She will continue the meloxicam as needed. I will obtain records from her chiropractor as well as the x-rays that he is done. I'll hold on any further imaging at this time.

## 2013-10-16 NOTE — Patient Instructions (Addendum)
Continue meloxicam as needed  Allergy referral  GI referral for possible celiac or glutean sensitivity  Sign release of records-  Notes and xrays For Dr. Ladona Ridgelaylor -- Chiropractor  F/U as needed
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
## 2013-10-22 ENCOUNTER — Encounter: Payer: Self-pay | Admitting: Family Medicine

## 2013-10-22 ENCOUNTER — Encounter: Payer: Self-pay | Admitting: Gastroenterology

## 2013-11-21 ENCOUNTER — Encounter: Payer: Self-pay | Admitting: Gastroenterology

## 2013-11-21 ENCOUNTER — Ambulatory Visit (INDEPENDENT_AMBULATORY_CARE_PROVIDER_SITE_OTHER): Payer: BC Managed Care – PPO | Admitting: Gastroenterology

## 2013-11-21 VITALS — BP 108/71 | HR 65 | Temp 97.2°F | Ht 67.0 in | Wt 186.6 lb

## 2013-11-21 DIAGNOSIS — R634 Abnormal weight loss: Secondary | ICD-10-CM

## 2013-11-21 DIAGNOSIS — R109 Unspecified abdominal pain: Secondary | ICD-10-CM

## 2013-11-21 DIAGNOSIS — R1013 Epigastric pain: Secondary | ICD-10-CM

## 2013-11-21 DIAGNOSIS — R197 Diarrhea, unspecified: Secondary | ICD-10-CM | POA: Insufficient documentation

## 2013-11-21 DIAGNOSIS — R1030 Lower abdominal pain, unspecified: Secondary | ICD-10-CM

## 2013-11-21 HISTORY — DX: Abnormal weight loss: R63.4

## 2013-11-21 MED ORDER — DICYCLOMINE HCL 10 MG PO CAPS: 10.0000 mg | ORAL_CAPSULE | Freq: Three times a day (TID) | ORAL | Status: DC

## 2013-11-21 NOTE — Progress Notes (Signed)
Primary Care Physician:  Milinda AntisURHAM, KAWANTA, MD  Primary Gastroenterologist:  Jonette EvaSandi Fields, MD   Chief Complaint  Patient presents with  . Abdominal Pain  . Nausea  . Diarrhea    HPI:  Dominique Davis is a 28 y.o. female here at the request of Dr. Jeanice Limurham for further evaluation abdominal pain, nausea, diarrhea. Stomach issues since young age. Has tried multiple diets in the past. Did Paleo, felt really good on the diet. When came off, then downhill. Gluten, sharp pain, diarrhea. Pain is sharp in the epigastrium and LUQ. Also with crampy lower abdominal pain.feels bloated. No nocturnal symptoms. Fatigue. Feels like stomach raw all the time. Also with nausea,?secondary topamax. No constipation now, used to have it growing up. BM multiple episodes of loose stools daily. Avoids food. Cannot go out to eat. No blood. No heartburn. No dysphagia. Weight down 25 pounds since 04/2013. Gluten free about 2-3 months.equivocal celiac screen. Also sent to allergist. Allergic to trout and black pepper. Phentermine last year. Works at [employer] and [employer]. Moobic at nigthtime.    Current Outpatient Prescriptions  Medication Sig Dispense Refill  . fluticasone (FLONASE) 50 MCG/ACT nasal spray Place 2 sprays into both nostrils daily. As needed      . meloxicam (MOBIC) 7.5 MG tablet Take 7.5 mg by mouth daily. As needed      . Norethindrone-Ethinyl Estradiol-Fe Biphas (LO LOESTRIN FE) 1 MG-10 MCG / 10 MCG tablet Take 1 tablet by mouth daily.  1 Package  12  . topiramate (TOPAMAX) 25 MG tablet Take 2 tablets (50 mg total) by mouth 2 (two) times daily.  120 tablet  3  . triamcinolone (NASACORT ALLERGY 24HR) 55 MCG/ACT AERO nasal inhaler Place 2 sprays into the nose daily. As needed       No current facility-administered medications for this visit.    Allergies as of 11/21/2013  . (No Known Allergies)    Past Medical History  Diagnosis Date  . Dysmenorrhea   . Anxiety   . Obesity   . Migraines     Past  Surgical History  Procedure Laterality Date  . Wisdom tooth extraction      x2    Family History  Problem Relation Age of Onset  . Diabetes Mother     gastroparesis  . Hypertension Mother   . Anxiety disorder Mother   . Hypertension Father   . Diabetes Maternal Grandmother   . Hypertension Maternal Grandmother   . Hypertension Paternal Grandmother   . Inflammatory bowel disease Neg Hx   . Colon cancer Neg Hx     History   Social History  . Marital Status: Single    Spouse Name: N/A    Number of Children: N/A  . Years of Education: N/A   Occupational History  . Not on file.   Social History Main Topics  . Smoking status: Never Smoker   . Smokeless tobacco: Never Used     Comment: Never smoked  . Alcohol Use: Yes     Comment: rare  . Drug Use: No  . Sexual Activity: Yes    Partners: Male    Birth Control/ Protection: Pill   Other Topics Concern  . Not on file   Social History Narrative  . No narrative on file      ROS:  General: Negative for anorexia, fever, chills, weakness.see hpi Eyes: Negative for vision changes.  ENT: Negative for hoarseness, difficulty swallowing , nasal congestion. CV: Negative for chest pain,  angina, palpitations, dyspnea on exertion, peripheral edema.  Respiratory: Negative for dyspnea at rest, dyspnea on exertion, cough, sputum, wheezing.  GI: See history of present illness. GU:  Negative for dysuria, hematuria, urinary incontinence, urinary frequency, nocturnal urination.  MS: Negative for joint pain,chronic low back pain.  Derm: Negative for rash or itching.  Neuro: Negative for weakness, abnormal sensation, seizure, frequent headaches, memory loss, confusion.  Psych: Negative for anxiety, depression, suicidal ideation, hallucinations.  Endo: see hpi Heme: Negative for bruising or bleeding. Allergy: Negative for rash or hives.    Physical Examination:  BP 108/71  Pulse 65  Temp(Src) 97.2 F (36.2 C) (Oral)  Ht 5\' 7"   (1.702 m)  Wt 186 lb 9.6 oz (84.641 kg)  BMI 29.22 kg/m2   General: Well-nourished, well-developed in no acute distress.  Head: Normocephalic, atraumatic.   Eyes: Conjunctiva pink, no icterus. Mouth: Oropharyngeal mucosa moist and pink , no lesions erythema or exudate. Neck: Supple without thyromegaly, masses, or lymphadenopathy.  Lungs: Clear to auscultation bilaterally.  Heart: Regular rate and rhythm, no murmurs rubs or gallops.  Abdomen: Bowel sounds are normal, +luq tenderness, +epig tenderness, nondistended, no hepatosplenomegaly or masses, no abdominal bruits or    hernia , no rebound or guarding.  No CVA tenderness. Rectal: not performed Extremities: No lower extremity edema. No clubbing or deformities.  Neuro: Alert and oriented x 4 , grossly normal neurologically.  Skin: Warm and dry, no rash or jaundice.   Psych: Alert and cooperative, normal mood and affect.  Labs: Lab Results  Component Value Date   WBC 4.4 06/14/2013   HGB 14.4 06/14/2013   HCT 43.1 06/14/2013   MCV 84.8 06/14/2013   PLT 253 06/14/2013   Lab Results  Component Value Date   TSH 2.903 03/11/2013   Lab Results  Component Value Date   CREATININE 0.93 06/14/2013   BUN 14 06/14/2013   NA 139 06/14/2013   K 4.4 06/14/2013   CL 104 06/14/2013   CO2 25 06/14/2013   Lab Results  Component Value Date   ALT 9 06/14/2013   AST 13 06/14/2013   ALKPHOS 33* 06/14/2013   BILITOT 0.4 06/14/2013     Imaging Studies: No results found.

## 2013-11-21 NOTE — Patient Instructions (Signed)
1. Please have your labs and stools done. 2. CT scan as ordered. 3. Start Bentyl one tablet up to four times daily as needed for diarrhea, abdominal pain. 4. Office visit with Dr. Darrick PennaFields in 6-8 weeks.

## 2013-11-22 ENCOUNTER — Ambulatory Visit (INDEPENDENT_AMBULATORY_CARE_PROVIDER_SITE_OTHER): Payer: BC Managed Care – PPO | Admitting: Gastroenterology

## 2013-11-22 DIAGNOSIS — Z1211 Encounter for screening for malignant neoplasm of colon: Secondary | ICD-10-CM

## 2013-11-23 LAB — CLOSTRIDIUM DIFFICILE BY PCR: Toxigenic C. Difficile by PCR: NOT DETECTED

## 2013-11-23 NOTE — Assessment & Plan Note (Addendum)
Chronic diarrhea associated with lower abdominal pain, epigastric/luq pain, nausea. Symptoms worse with certain foods. 25 pound weight loss, more recently unintentional . No typical gerd. Recent Mobic. Gliadin IgG was positive but TTG negative. Check stools.CT A/P for abdominal pain, weight loss, diarrhea. ?IBD, vs pancreatic disease. HLA testing for celiac given patient has been on gluten free diet for several months. Trial of antispasmodic. Further recommendations to follow.

## 2013-11-25 LAB — GIARDIA/CRYPTOSPORIDIUM (EIA)
CRYPTOSPORIDIUM SCREEN (EIA) (SOL): NEGATIVE
Giardia Screen (EIA): NEGATIVE

## 2013-11-25 LAB — IFOBT (OCCULT BLOOD): IFOBT: NEGATIVE

## 2013-11-25 NOTE — Progress Notes (Signed)
IFOBT Test Results  (-) Negative 

## 2013-11-25 NOTE — Progress Notes (Signed)
cc'd to pcp 

## 2013-11-26 LAB — STOOL CULTURE

## 2013-11-27 ENCOUNTER — Ambulatory Visit (HOSPITAL_COMMUNITY)
Admission: RE | Admit: 2013-11-27 | Discharge: 2013-11-27 | Disposition: A | Discharge status: Home / Self Care | Payer: BC Managed Care – PPO | Source: Ambulatory Visit | Attending: Gastroenterology | Admitting: Gastroenterology

## 2013-11-27 ENCOUNTER — Encounter (HOSPITAL_COMMUNITY): Payer: Self-pay

## 2013-11-27 DIAGNOSIS — R109 Unspecified abdominal pain: Secondary | ICD-10-CM | POA: Insufficient documentation

## 2013-11-27 DIAGNOSIS — R1030 Lower abdominal pain, unspecified: Secondary | ICD-10-CM

## 2013-11-27 DIAGNOSIS — R1013 Epigastric pain: Secondary | ICD-10-CM

## 2013-11-27 DIAGNOSIS — R197 Diarrhea, unspecified: Secondary | ICD-10-CM

## 2013-11-27 DIAGNOSIS — R634 Abnormal weight loss: Secondary | ICD-10-CM

## 2013-11-27 MED ORDER — IOHEXOL 300 MG/ML  SOLN
100.0000 mL | Freq: Once | INTRAMUSCULAR | Status: AC | PRN
  Administered 2013-11-27: 100 mL via INTRAVENOUS

## 2013-11-27 NOTE — Progress Notes (Signed)
Quick Note:  CT with moderate stool load. Otherwise negative. Await celiac genetics.  ______

## 2013-11-27 NOTE — Progress Notes (Signed)
Quick Note:  Please let pt know, stool studies normal. ifobt negative. See CT results. ______

## 2013-12-04 NOTE — Progress Notes (Signed)
Quick Note:  Letter mailed with results. ______

## 2013-12-04 NOTE — Progress Notes (Signed)
Quick Note:  Pt is aware of the CT report. ______

## 2013-12-16 NOTE — Progress Notes (Signed)
Quick Note:  Please find out if patient had her celiac HLA labs done or are they still pending. ______ 

## 2013-12-17 NOTE — Progress Notes (Signed)
Quick Note:  LMOM to call. ______ 

## 2013-12-19 ENCOUNTER — Telehealth: Payer: Self-pay

## 2013-12-19 ENCOUNTER — Other Ambulatory Visit: Payer: Self-pay | Admitting: Gastroenterology

## 2013-12-19 DIAGNOSIS — R894 Abnormal immunological findings in specimens from other organs, systems and tissues: Secondary | ICD-10-CM

## 2013-12-19 NOTE — Telephone Encounter (Signed)
I distinctly remember discussing this with a nurse the day of OV. I had chosen the [facility] test code instead of [facility].   Not sure what happened about correcting this but I just put in the order for [facility].

## 2013-12-19 NOTE — Telephone Encounter (Signed)
Pt left Vm that she went to get celiac labs done and the lab did not have the correct orders.  Please advise!

## 2013-12-20 NOTE — Progress Notes (Signed)
Quick Note:  See separate phone note of 12/19/2013. Pt aware of new lab order faxed to [facility]. ______

## 2013-12-20 NOTE — Telephone Encounter (Signed)
[facility] and pt is aware.

## 2013-12-26 ENCOUNTER — Encounter: Payer: Self-pay | Admitting: Gastroenterology

## 2013-12-26 ENCOUNTER — Ambulatory Visit (INDEPENDENT_AMBULATORY_CARE_PROVIDER_SITE_OTHER): Payer: BC Managed Care – PPO | Admitting: Gastroenterology

## 2013-12-26 ENCOUNTER — Encounter (HOSPITAL_COMMUNITY): Payer: Self-pay | Admitting: Pharmacy Technician

## 2013-12-26 ENCOUNTER — Other Ambulatory Visit: Payer: Self-pay | Admitting: Gastroenterology

## 2013-12-26 VITALS — BP 125/78 | HR 69 | Temp 97.6°F | Ht 67.0 in | Wt 190.4 lb

## 2013-12-26 DIAGNOSIS — R197 Diarrhea, unspecified: Secondary | ICD-10-CM

## 2013-12-26 MED ORDER — LINACLOTIDE 145 MCG PO CAPS: ORAL_CAPSULE | ORAL | Status: DC

## 2013-12-26 NOTE — Progress Notes (Signed)
   Subjective:    Patient ID: Dominique Davis, female    DOB: 06-18-1985, 28 y.o.   MRN: [id number]  Milinda AntisURHAM, KAWANTA, MD  HPI ALWAYS HAD STOMACH ISSUES BUT NEVER CHECKED OUT. REPORTS MULTIPLE VISITS TO MD FOR ABDOMINA PAIN AS A CHILD AND MD FELT IT WAS CONSTIPATION. MOTHER ALSO HAS ABDOMINAL PROBLEMS. SX WORSE FOR PAST COUPLE OF YEARS. THINGS IMPROVED ON PALEO DIET BUT BECAUSE IT'S EXPENSIVE NOW EATING NORMAL STUFF. RECENTLY TRIED PALEO BUT IT'S HIT OR MISS. SEEING CHIROPRACTOR FOR MIGRAINE. MIGRAINES MORE INTENSE. BEEN GLUTEN FREE FOR A FEW MOS. BMs: at first excessive diarrhea(watery), now not going as much. DIDN'T THINK IT WAS DUE TO TOPAMAX. WANTS TO KNOW IF SHE'S LACTOSE INTOLERANT. THOUGH SOMETHING WAS WRONG WITH HER BUT PARENTS DIDN'T GET HER CHECKED.  WITH DAIRY GETS BLOATED, CONSTIPATED, ABDOMINAL PAIN. RIGHT NOW NOT TRACKING FIBER INTAKE. WATER: 9 CUPS A DAY. BMs: QD-QOD(HURTS WHEN IT COMES OUT). OCCASIONAL NAUSEA. GETS TIRED.  PT DENIES FEVER, CHILLS, HEMATOCHEZIA, vomiting, melena, diarrhea, CHEST PAIN, SHORTNESS OF BREATH,  problems swallowing, problems with sedation, OR heartburn or indigestion.   Past Medical History  Diagnosis Date  . Dysmenorrhea   . Anxiety   . Obesity   . Migraines    Past Surgical History  Procedure Laterality Date  . Wisdom tooth extraction      x2   No Known Allergies  Current Outpatient Prescriptions  Medication Sig Dispense Refill  . dicyclomine (BENTYL) 10 MG capsule Take 1 capsule (10 mg total) by mouth 4 (four) times daily -  before meals and at bedtime. Prn abdominal pain, diarrhea NOT USING. MAY WORK OR NOT.  1  . fluticasone (FLONASE) 50 MCG/ACT nasal spray Place 2 sprays into both nostrils daily. As needed     . meloxicam (MOBIC) 7.5 MG tablet Take 7.5 mg by mouth daily. As needed     . Norethindrone-Ethinyl Estradiol-Fe Biphas (LO LOESTRIN FE) 1 MG-10 MCG / 10 MCG tablet Take 1 tablet by mouth daily.   12  . topiramate (TOPAMAX) 25 MG  tablet Take 2 tablets (50 mg total) by mouth 2 (two) times daily.   3  . triamcinolone (NASACORT ALLERGY 24HR) 55 MCG/ACT AERO nasal inhaler Place 2 sprays into the nose daily. As needed         Review of Systems PER HPI OTHERWISE ALL SYSTEMS ARE NEGATIVE.      Objective:   Physical Exam  Vitals reviewed. Constitutional: She is oriented to person, place, and time. She appears well-developed and well-nourished. No distress.  HENT:  Head: Normocephalic and atraumatic.  Mouth/Throat: Oropharynx is clear and moist. No oropharyngeal exudate.  Eyes: Pupils are equal, round, and reactive to light. No scleral icterus.  Neck: Normal range of motion. Neck supple.  Cardiovascular: Normal rate, regular rhythm and normal heart sounds.   Pulmonary/Chest: Effort normal and breath sounds normal. No respiratory distress.  Abdominal: Soft. Bowel sounds are normal. She exhibits no distension. There is no tenderness.  Musculoskeletal: She exhibits no edema.  Lymphadenopathy:    She has no cervical adenopathy.  Neurological: She is alert and oriented to person, place, and time.  NO FOCAL DEFICITS   Psychiatric: She has a normal mood and affect.          Assessment & Plan:

## 2013-12-26 NOTE — Patient Instructions (Signed)
HYDROGEN BREATH TEST TO EVALUATE FOR LACTOSE INTOLERANCE.  MINIMIZE USE OF BENTYL. IT MAY CAUSE CONSTIPATION.  ADD LINZESS 30 MINS PRIOR TO BREAKFAST. IT MAY CAUSE EXPLOSIVE DIARRHEA.  DRINK WATER TO KEEP YOUR URINE LIGHT YELLOW.  FIND THE FIBER TO MAKE TO HAVE MORE REGULAR BOWEL MOVEMENTS.  FOLLOW UP IN 3 MOS.

## 2013-12-26 NOTE — Assessment & Plan Note (Addendum)
MOST LIKELY DUE TO OVERFLOW INCONTINENCE IN LIGHT OF PT C/O DIARRHEA BUT FORMED STOOL ON CT.   HBT FOR LACTOSE INTOLERANCE AWAIT CELIAC STUDIES. MINIMIZE USE OF BENTYL. ADD LINZESS 30 MINS PRIOR TO BREAKFAST. IT MAY CAUSE EXPLOSIVE DIARRHEA. DRINK WATER TO KEEP URINE LIGHT YELLOW. FIND A FIBER TO MAKE TO HAVE MORE REGULAR BOWEL MOVEMENTS. FOLLOW UP IN 3 MOS.

## 2013-12-26 NOTE — Progress Notes (Signed)
REVIEWED. PT C/O DIARRHEA. CT JUN 24-FORMED STOOL IN COLON.

## 2013-12-31 NOTE — Progress Notes (Signed)
Reminder in epic °

## 2013-12-31 NOTE — Progress Notes (Signed)
cc'd to pcp 

## 2014-01-03 ENCOUNTER — Ambulatory Visit (HOSPITAL_COMMUNITY)
Admission: RE | Admit: 2014-01-03 | Discharge: 2014-01-03 | Disposition: A | Discharge status: Home / Self Care | Payer: BC Managed Care – PPO | Source: Ambulatory Visit | Attending: Gastroenterology | Admitting: Gastroenterology

## 2014-01-03 ENCOUNTER — Encounter (HOSPITAL_COMMUNITY): Payer: Self-pay | Admitting: Gastroenterology

## 2014-01-03 ENCOUNTER — Encounter (HOSPITAL_COMMUNITY)
Admission: RE | Disposition: A | Discharge status: Home / Self Care | Payer: Self-pay | Source: Ambulatory Visit | Attending: Gastroenterology

## 2014-01-03 HISTORY — PX: BACTERIAL OVERGROWTH TEST: SHX5739

## 2014-01-03 SURGERY — BREATH TEST, FOR INTESTINAL BACTERIAL OVERGROWTH

## 2014-01-03 MED ORDER — LACTULOSE 10 GM/15ML PO SOLN
ORAL | Status: AC
  Filled 2014-01-03: qty 30

## 2014-01-03 NOTE — Progress Notes (Signed)
No beans, bran or high fiber cereal the day before the procedure? Yes NPO except for water 12 hours before procedure? yes No smoking, sleeping or vigorous exercising for at least 30 before procedure? no Recent antibiotic use and/or diarrhea? no   If yes, physician notified.  Time Baseline 15 mins 30 mins 45 mins 60 mins 75 mins 90 mins 105 mins 120 mins 135 mins 150 mins 165 mins 180 mins  H2-ppm 2

## 2014-01-03 NOTE — Progress Notes (Signed)
Breath Test Cancelled due to patient eating beans the day before the procedure.  Instructions written down for the patient and reviewed.  Breath Test Procedure will be rescheduled for a later date.

## 2014-01-04 DIAGNOSIS — K6389 Other specified diseases of intestine: Secondary | ICD-10-CM

## 2014-01-04 DIAGNOSIS — K638219 Small intestinal bacterial overgrowth, unspecified: Secondary | ICD-10-CM

## 2014-01-04 HISTORY — DX: Other specified diseases of intestine: K63.89

## 2014-01-04 HISTORY — DX: Small intestinal bacterial overgrowth, unspecified: K63.8219

## 2014-01-06 ENCOUNTER — Other Ambulatory Visit: Payer: Self-pay | Admitting: Gastroenterology

## 2014-01-06 ENCOUNTER — Telehealth: Payer: Self-pay

## 2014-01-06 NOTE — Telephone Encounter (Signed)
I am not sure where this would be.  Please let her know Verlon AuLeslie is out this week.  We need to call the lab

## 2014-01-06 NOTE — Telephone Encounter (Signed)
She is wanting the results for her blood work that she had done on July 17,2015.

## 2014-01-07 ENCOUNTER — Encounter: Payer: Self-pay | Admitting: Gastroenterology

## 2014-01-08 ENCOUNTER — Encounter (HOSPITAL_COMMUNITY): Payer: Self-pay | Admitting: Pharmacy Technician

## 2014-01-08 NOTE — Telephone Encounter (Signed)
I called the lab and they are faxing the lab result for the celiac to us.

## 2014-01-10 ENCOUNTER — Encounter (HOSPITAL_COMMUNITY)
Admission: RE | Disposition: A | Discharge status: Home / Self Care | Payer: Self-pay | Source: Ambulatory Visit | Attending: Gastroenterology

## 2014-01-10 ENCOUNTER — Encounter (HOSPITAL_COMMUNITY): Payer: Self-pay | Admitting: *Deleted

## 2014-01-10 ENCOUNTER — Ambulatory Visit (HOSPITAL_COMMUNITY)
Admission: RE | Admit: 2014-01-10 | Discharge: 2014-01-10 | Disposition: A | Discharge status: Home / Self Care | Payer: BC Managed Care – PPO | Source: Ambulatory Visit | Attending: Gastroenterology | Admitting: Gastroenterology

## 2014-01-10 DIAGNOSIS — R197 Diarrhea, unspecified: Secondary | ICD-10-CM

## 2014-01-10 DIAGNOSIS — R1013 Epigastric pain: Secondary | ICD-10-CM

## 2014-01-10 DIAGNOSIS — K6389 Other specified diseases of intestine: Secondary | ICD-10-CM | POA: Insufficient documentation

## 2014-01-10 HISTORY — PX: BACTERIAL OVERGROWTH TEST: SHX5739

## 2014-01-10 SURGERY — BREATH TEST, FOR INTESTINAL BACTERIAL OVERGROWTH

## 2014-01-10 MED ORDER — LACTULOSE 10 GM/15ML PO SOLN
ORAL | Status: AC
  Filled 2014-01-10: qty 60

## 2014-01-10 MED ORDER — LACTULOSE 10 GM/15ML PO SOLN
38.0000 g | Freq: Once | ORAL | Status: AC
  Administered 2014-01-10: 38 g via ORAL

## 2014-01-10 NOTE — Progress Notes (Signed)
Ok for pt to stop test.  Per Dr Darrick PennaFields

## 2014-01-10 NOTE — Progress Notes (Signed)
No beans, bran or high fiber cereal the day before the procedure? no NPO except for water 12 hours before procedure?  [id number]}O:[id number]} No smoking, sleeping or vigorous exercising for at least 30 before procedure?  [id number]}O:22349} Recent antibiotic use and/or diarrhea?  [id number]:14782}O:22349}   If yes, physician notified.  Time Baseline 15 mins 30 mins 45 mins 60 mins 75 mins 90 mins 105 mins 120 mins 135 mins 150 mins 165 mins 180 mins  H2-ppm 2

## 2014-01-10 NOTE — Progress Notes (Signed)
No beans, bran or high fiber cereal the day before the procedure? no NPO except for water 12 hours before procedure? no No smoking, sleeping or vigorous exercising for at least 30 before procedure? no Recent antibiotic use and/or diarrhea? no   If yes, physician notified.  Time Baseline 15 mins 30 mins 45 mins 60 mins 75 mins 90 mins 105 mins 120 mins 135 mins 150 mins 165 mins 180 mins  H2-ppm  3 [phone]

## 2014-01-13 NOTE — Telephone Encounter (Signed)
Labs are in [facility] box

## 2014-01-13 NOTE — Telephone Encounter (Signed)
I called the lab and they are faxing to me now. I am waiting for them.

## 2014-01-13 NOTE — Telephone Encounter (Signed)
I have not received the labs yet. Please follow up on this. Thanks!

## 2014-01-13 NOTE — Telephone Encounter (Signed)
Please let patient know her HLA typing for celiac disease was NEGATIVE. HBT ordered by [facility], results pending. Defer further advise to [facility] who saw patient recently.

## 2014-01-15 ENCOUNTER — Encounter (HOSPITAL_COMMUNITY): Payer: Self-pay | Admitting: Gastroenterology

## 2014-01-15 ENCOUNTER — Telehealth: Payer: Self-pay | Admitting: Gastroenterology

## 2014-01-15 MED ORDER — METRONIDAZOLE 500 MG PO TABS: 500.0000 mg | ORAL_TABLET | Freq: Two times a day (BID) | ORAL | Status: DC

## 2014-01-15 MED ORDER — CIPROFLOXACIN HCL 500 MG PO TABS: ORAL_TABLET | ORAL | Status: DC

## 2014-01-15 NOTE — Telephone Encounter (Addendum)
PLEASE CALL PT. HER HYDROGEN BREATH TEST SUGGESTS THAT SMALLBOWL BACTERIAL OVERGROWTH IS CONTRIBUTING TO  HER PAIN, NAUSEA, AND BLOATING.  SHE NEEDS CIP/FLAG for 7 days THEN TAKE A PROBIOTIC DAILY FOR SIX  MONTHS(RESTORA,  PHILLIP'S COLON HEALTH, ALIGN). Follow up in NOV 2015 E30 ABDOMINAL PAIN, NAUSEA.

## 2014-01-15 NOTE — Addendum Note (Signed)
Addended by: West BaliFIELDS, Calel Pisarski L on: 01/15/2014 09:39 PM   Modules accepted: Orders

## 2014-01-15 NOTE — Telephone Encounter (Signed)
REVIEWED.  

## 2014-01-15 NOTE — Telephone Encounter (Signed)
Pt had LMOM that she wanted to make an OV with [facility] to discuss her results. I called patient to offer her OV, but she then said that she didn't really need an appointment. She said that on July 17th she had a genetic celiac panel done and hasn't heard from us what her results are and said that waiting a month is unacceptable and she needed to know what her results are. Please advise if we have received them and please call patient back at [phone]

## 2014-01-15 NOTE — Procedures (Signed)
PRE-OPERATIVE DIAGNOSIS:  BLOATING,NAUSEA, EPIGASTRIC PAIN POST-OPERATIVE DIAGNOSIS:  SIBO  PROCEDURE:  Procedure(s): HYDROGEN BREATH TEST  SURGEON:  Surgeon(s): Arlyce HarmanSandi M Joene Gelder, MD  MEDICATIONS USED:  LACTULOSE  FINDINGS: BREATHALYZER- O MIN(6 PPM), FIRST PEAK (65 PPM, 75 MINS), TROUGH (58 PPM 105 MINS)  DIAGNOSIS: SMALL BOWEL BACTERIAL OVERGROWTH BASED ON READING FROM 30 MINS TO 105 MINS > 20 PPM .   PLAN OF CARE:  1. CIP/FLAG for 7 days THEN PROBIOTIC DAILY. 2. Follow up in NOV 2015

## 2014-01-15 NOTE — Telephone Encounter (Signed)
Called and informed pt of results

## 2014-01-15 NOTE — Telephone Encounter (Signed)
I called and informed pt of her results. 9 See note of 01/13/2014 from Tana CoastLeslie Lewis, PA. She is aware when I get the results of the HBT I will call her.

## 2014-01-16 ENCOUNTER — Other Ambulatory Visit: Payer: Self-pay | Admitting: Family Medicine

## 2014-01-16 ENCOUNTER — Telehealth: Payer: Self-pay | Admitting: Family Medicine

## 2014-01-16 NOTE — Telephone Encounter (Signed)
Patient was speaking to you yesterday and she said she had some information she would like to speak with you about please call her back at [phone]

## 2014-01-16 NOTE — Telephone Encounter (Signed)
Called and informed pt.  

## 2014-01-16 NOTE — Telephone Encounter (Signed)
Call placed to patient to inquire about request.   [facility].

## 2014-01-16 NOTE — Telephone Encounter (Signed)
Patient returned call and states that GI gave her ABT.   States that she generally develops yeast infection after treatment with ABT and requested refill on Diflucan.   Ok to refill??

## 2014-01-16 NOTE — Telephone Encounter (Signed)
okay

## 2014-01-16 NOTE — Telephone Encounter (Signed)
Prescription sent to pharmacy.

## 2014-01-16 NOTE — Telephone Encounter (Signed)
Reminder in epic °

## 2014-01-17 NOTE — Telephone Encounter (Signed)
LMTRC

## 2014-01-17 NOTE — Telephone Encounter (Signed)
Spoke to pt and she said that the GI MD called and explained to her about her test result and the need for antibiotic and probiotic, she felt very rush off the phone and that all her questions were not answered. I explained to her that she really needed to make a f/u appt with the GI MD so that anything she did not understand could be explained more in detail. She states that she works 2 jobs and it is very hard for her to get off work for a lot of appts. I told pt to try the antibiotics and the probiotic as explained to her by GI and if not better in 1 month or symptoms get worse to call us back. She requested another GI MD for a second opinion and I told her to just try the medication for 1 month and if no better or worse to call us back and we would refer her to another GI MD. Pt verbalized understanding and will try the medication course.

## 2014-01-21 ENCOUNTER — Other Ambulatory Visit: Payer: Self-pay | Admitting: Family Medicine

## 2014-01-21 ENCOUNTER — Other Ambulatory Visit: Payer: Self-pay | Admitting: Obstetrics & Gynecology

## 2014-01-21 ENCOUNTER — Encounter: Payer: Self-pay | Admitting: Family Medicine

## 2014-01-21 DIAGNOSIS — K529 Noninfective gastroenteritis and colitis, unspecified: Secondary | ICD-10-CM

## 2014-01-21 DIAGNOSIS — K9041 Non-celiac gluten sensitivity: Secondary | ICD-10-CM

## 2014-01-22 ENCOUNTER — Encounter: Payer: Self-pay | Admitting: Gastroenterology

## 2014-01-22 ENCOUNTER — Telehealth: Payer: Self-pay | Admitting: Family Medicine

## 2014-01-22 DIAGNOSIS — Z308 Encounter for other contraceptive management: Secondary | ICD-10-CM

## 2014-01-22 MED ORDER — NORETHIN-ETH ESTRAD-FE BIPHAS 1 MG-10 MCG / 10 MCG PO TABS: 1.0000 | ORAL_TABLET | Freq: Every day | ORAL | Status: DC

## 2014-01-22 NOTE — Telephone Encounter (Signed)
[facility] pharmacy calling regarding rx for patient please call them back [phone] her name is Dominique Davis

## 2014-01-22 NOTE — Telephone Encounter (Signed)
Prescription sent to pharmacy.

## 2014-01-22 NOTE — Telephone Encounter (Signed)
Ok to refill 

## 2014-01-22 NOTE — Telephone Encounter (Signed)
Okay, give 6 

## 2014-01-22 NOTE — Telephone Encounter (Signed)
[facility]   Patient requesting refill on birth control.   Medication has been sent to pharmacy.

## 2014-01-22 NOTE — Telephone Encounter (Signed)
PT is needing a refill on her Norethindrone-Ethinyl Estradiol-Fe Biphas (LO LOESTRIN FE) 1 MG-10 MCG / 10 MCG tablet  (Birth Control Medication)  --make sure name brand and not generic  She was seeing her gyn doctor for this but is planning on starting to see us for this stuff and she is out of refills and is completley out of this   Pharmacy [facility]

## 2014-01-27 ENCOUNTER — Other Ambulatory Visit: Payer: Self-pay | Admitting: *Deleted

## 2014-01-27 DIAGNOSIS — Z308 Encounter for other contraceptive management: Secondary | ICD-10-CM

## 2014-01-27 MED ORDER — NORETHIN-ETH ESTRAD-FE BIPHAS 1 MG-10 MCG / 10 MCG PO TABS: 1.0000 | ORAL_TABLET | Freq: Every day | ORAL | Status: DC

## 2014-01-27 NOTE — Telephone Encounter (Signed)
Authorized 3 refills for patient she needs to make appointment before further refills are granted.

## 2014-01-29 ENCOUNTER — Telehealth: Payer: Self-pay | Admitting: Family Medicine

## 2014-01-29 MED ORDER — NYSTATIN 100000 UNIT/ML MT SUSP: 5.0000 mL | Freq: Four times a day (QID) | OROMUCOSAL | Status: DC

## 2014-01-29 NOTE — Telephone Encounter (Signed)
Given that she was recently on antibiotics and that she now has white coating on her tongue it most likely is thrush. Tell her that we will go ahead and call in medication to treat thrush. However if symptoms worsen or do not resolve in one week , then she definitely needs to be evaluated immediately. Send prescription for nystatin suspension 5 mL swish and swallow 4 times a day ( TID after meals and QHS) x7 days 0 Refills.

## 2014-01-29 NOTE — Telephone Encounter (Signed)
Call placed to patient to inquire.   States that she has some blister to the back of her mouth and her tongue is coated with white film.   Please advise.

## 2014-01-29 NOTE — Telephone Encounter (Signed)
Prescription sent to pharmacy. .   Call placed to patient and patient made aware.  

## 2014-01-29 NOTE — Telephone Encounter (Signed)
Patient calling to say that she was put on an antibiotic now she thinks she has thrush, please call her back at [phone]

## 2014-02-05 ENCOUNTER — Encounter: Payer: Self-pay | Admitting: Family Medicine

## 2014-02-05 ENCOUNTER — Telehealth: Payer: Self-pay | Admitting: Family Medicine

## 2014-02-05 ENCOUNTER — Ambulatory Visit (INDEPENDENT_AMBULATORY_CARE_PROVIDER_SITE_OTHER): Payer: BC Managed Care – PPO | Admitting: Family Medicine

## 2014-02-05 VITALS — BP 116/64 | HR 68 | Temp 98.1°F | Resp 14 | Ht 66.0 in | Wt 189.0 lb

## 2014-02-05 DIAGNOSIS — B370 Candidal stomatitis: Secondary | ICD-10-CM

## 2014-02-05 DIAGNOSIS — S0082XA Blister (nonthermal) of other part of head, initial encounter: Secondary | ICD-10-CM

## 2014-02-05 DIAGNOSIS — M549 Dorsalgia, unspecified: Secondary | ICD-10-CM

## 2014-02-05 DIAGNOSIS — G8929 Other chronic pain: Secondary | ICD-10-CM

## 2014-02-05 DIAGNOSIS — S0002XA Blister (nonthermal) of scalp, initial encounter: Secondary | ICD-10-CM

## 2014-02-05 DIAGNOSIS — S00522A Blister (nonthermal) of oral cavity, initial encounter: Secondary | ICD-10-CM

## 2014-02-05 DIAGNOSIS — S1092XA Blister (nonthermal) of unspecified part of neck, initial encounter: Secondary | ICD-10-CM

## 2014-02-05 MED ORDER — MAGIC MOUTHWASH W/LIDOCAINE: ORAL | Status: DC

## 2014-02-05 MED ORDER — FLUCONAZOLE 100 MG PO TABS: 100.0000 mg | ORAL_TABLET | Freq: Every day | ORAL | Status: DC

## 2014-02-05 NOTE — Progress Notes (Signed)
Patient ID: Dominique Davis, female   DOB: 03/03/1986, 28 y.o.   MRN: [id number]   Subjective:    Patient ID: Dominique Davis, female    DOB: 10-Feb-1986, 28 y.o.   MRN: [id number]  Patient presents for Blisters in Mouth  patient here with blisters and sore throat for the past 2 weeks. She was on MMM body secondary to bacterial overgrowth which she was seen by gastroenterology. She called in as she noted thrush her tongue she was given nystatin swish and swallow which she uses for 7 days this cleared up her tongue region however she still has a fuzzy feeling in the back of her throat and she has some discomfort when she swallows she also noticed some small blisters. She denies any fever. She has been fatigued and stressed out recently she is working up her to 60 hours a week. She's had increased back pain and is now getting manipulations twice a week from her chronic back pain.    Review Of Systems:  GEN- +fatigue, fever, weight loss,weakness, recent illness HEENT- denies eye drainage, change in vision, nasal discharge, CVS- denies chest pain, palpitations RESP- denies SOB, cough, wheeze ABD- denies N/V, change in stools, abd pain GU- denies dysuria, hematuria, dribbling, incontinence MSK- +joint pain, +muscle aches, injury Neuro- denies headache, dizziness, syncope, seizure activity       Objective:    BP 116/64  Pulse 68  Temp(Src) 98.1 F (36.7 C) (Oral)  Resp 14  Ht  (1.676 m)  Wt 189 lb (85.73 kg)  BMI 30.52 kg/m2 GEN- NAD, alert and oriented x3 HEENT- PERRL, EOMI, non injected sclera, pink conjunctiva, MMM, post oropharynx injected with white plaque noted, tiny blister noted left side of oropharynx, tongue clear  Neck- Supple, no thyromegaly, no LAD         Assessment & Plan:      Problem List Items Addressed This Visit   Chronic back pain     She's no chronic back pain she is under care chiropractor with her increased hours I think she is overdue it right now  I'm going to give her the next  2 days off to rest her back, she can also use melatonin for sleep  Other Visit Diagnoses   Oral thrush    -  Primary    I will treat her with Diflucan 100 mg daily for one week I've also given her Magic mouthwash rinse for the pain, think this is secondary to the antibiotic she was on as well as increased stress    Relevant Medications       fluconazole (DIFLUCAN) tablet 100 mg       Alum & Mag Hydroxide-Simeth (MAGIC MOUTHWASH W/LIDOCAINE) SOLN    Non-thermal blister of oral cavity, initial encounter           Note: This dictation was prepared with Dragon dictation along with smaller phrase technology. Any transcriptional errors that result from this process are unintentional.

## 2014-02-05 NOTE — Telephone Encounter (Signed)
Pam from [facility] pharmacy calling about this patients rx please call her at [phone]

## 2014-02-05 NOTE — Patient Instructions (Addendum)
Take diflucan as prescribed Work note given F/u as needed

## 2014-02-05 NOTE — Telephone Encounter (Signed)
Returned call to pharmacy.   Requested clarification on magic mouthwash. Advised to use Duke's formula.

## 2014-02-12 ENCOUNTER — Telehealth: Payer: Self-pay | Admitting: Family Medicine

## 2014-02-12 NOTE — Telephone Encounter (Signed)
Called pharmacy and none of the mouthwashes are covered by her insurance. Per Dr. Jeanice Lim may gargle with salt water and follow up with GI to see if they have any further recommendations. Pt aware per vm on cell #.

## 2014-02-12 NOTE — Telephone Encounter (Signed)
Okay to send Dukes mouthwash

## 2014-02-12 NOTE — Telephone Encounter (Signed)
Pt called and states that she finished the Diflucan but was unable to get the last prescribed mouthwash that her insurance did not cover so she did not get it. She states that she is still having the blisters in mouth and throat and wants to know what else she/we can do?

## 2014-02-13 ENCOUNTER — Encounter: Payer: Self-pay | Admitting: Gastroenterology

## 2014-02-25 ENCOUNTER — Encounter: Payer: Self-pay | Admitting: Family Medicine

## 2014-03-27 ENCOUNTER — Ambulatory Visit (INDEPENDENT_AMBULATORY_CARE_PROVIDER_SITE_OTHER): Payer: BC Managed Care – PPO | Admitting: Physician Assistant

## 2014-03-27 ENCOUNTER — Encounter: Payer: Self-pay | Admitting: Physician Assistant

## 2014-03-27 VITALS — BP 122/68 | HR 72 | Temp 97.9°F | Resp 14 | Ht 66.0 in | Wt 194.0 lb

## 2014-03-27 DIAGNOSIS — R5383 Other fatigue: Secondary | ICD-10-CM

## 2014-03-27 DIAGNOSIS — K9041 Non-celiac gluten sensitivity: Secondary | ICD-10-CM

## 2014-03-27 DIAGNOSIS — K900 Celiac disease: Secondary | ICD-10-CM

## 2014-03-27 DIAGNOSIS — Z3041 Encounter for surveillance of contraceptive pills: Secondary | ICD-10-CM | POA: Insufficient documentation

## 2014-03-27 DIAGNOSIS — M2550 Pain in unspecified joint: Secondary | ICD-10-CM

## 2014-03-27 DIAGNOSIS — G8929 Other chronic pain: Secondary | ICD-10-CM

## 2014-03-27 DIAGNOSIS — K5900 Constipation, unspecified: Secondary | ICD-10-CM

## 2014-03-27 DIAGNOSIS — R1013 Epigastric pain: Secondary | ICD-10-CM

## 2014-03-27 DIAGNOSIS — Z308 Encounter for other contraceptive management: Secondary | ICD-10-CM

## 2014-03-27 DIAGNOSIS — R1030 Lower abdominal pain, unspecified: Secondary | ICD-10-CM

## 2014-03-27 DIAGNOSIS — R197 Diarrhea, unspecified: Secondary | ICD-10-CM

## 2014-03-27 MED ORDER — NORETHIN-ETH ESTRAD-FE BIPHAS 1 MG-10 MCG / 10 MCG PO TABS: 1.0000 | ORAL_TABLET | Freq: Every day | ORAL | Status: DC

## 2014-03-27 NOTE — Progress Notes (Signed)
Patient ID: Jerrell BelfastVictoria G Rolfson MRN: [id number], DOB: May 06, 1986, 28 y.o. Date of Encounter: @DATE @  Chief Complaint:  Chief Complaint  Patient presents with  . PAP Smear    is not fasting  . Labs    thinks she may have iron deficiency    HPI: 28 y.o. year old white female  presents for above.   Says that she came in today because she was told she needed an exam prior to further refills on her birth control pills. States that current birth control pills are working well and causing no adverse effects and she would like to continue the same current medication.  She says that she does want to do some lab work while she is here. Says that she was diagnosed with small intestinal overgrowth. Says that she was treated with antibiotics and then told to stay on probiotics for 6-12 months. Says that she is still feeling significant fatigue and just not feeling good. Is wondering if the bowel overgrowth could be causing decrease absorption of iron or other things. However did not want to start a supplement without checking the levels. Also has been seeing GI-- she's been having a lot of abdominal pain and abdominal bloating and says that her stools go back and forth between diarrhea and constipation. Says that she has stopped all dairy and gluten.  also says that she's been talking to her chiropractor. She has pains in multiple joints and multiple areas of her body. She has pain in her knees ankles and wrists specifically. She says the chiropractor mentioned she may have fibromyalgia.   Past Medical History  Diagnosis Date  . Dysmenorrhea   . Anxiety   . Obesity   . Migraines      Home Meds: Outpatient Prescriptions Prior to Visit  Medication Sig Dispense Refill  . EPIPEN 2-PAK 0.3 MG/0.3ML SOAJ injection Inject 0.3 mg into the muscle as needed (allergic reaction).       . fluticasone (FLONASE) 50 MCG/ACT nasal spray Place 2 sprays into both nostrils daily. As needed      . Linaclotide  (LINZESS) 145 MCG CAPS capsule 1 PO 30 mins prior to your first meal  30 capsule  11  . triamcinolone (NASACORT ALLERGY 24HR) 55 MCG/ACT AERO nasal inhaler Place 2 sprays into the nose daily. As needed      . Norethindrone-Ethinyl Estradiol-Fe Biphas (LO LOESTRIN FE) 1 MG-10 MCG / 10 MCG tablet Take 1 tablet by mouth daily.  1 Package  2  . Alum & Mag Hydroxide-Simeth (MAGIC MOUTHWASH W/LIDOCAINE) SOLN Swish and spit 1 teaspoon four times a day as needed  60 mL  0  . dicyclomine (BENTYL) 10 MG capsule Take 1 capsule (10 mg total) by mouth 4 (four) times daily -  before meals and at bedtime. Prn abdominal pain, diarrhea  120 capsule  1  . fluconazole (DIFLUCAN) 100 MG tablet Take 1 tablet (100 mg total) by mouth daily.  7 tablet  0  . meloxicam (MOBIC) 7.5 MG tablet Take 7.5 mg by mouth daily as needed for pain. As needed      . topiramate (TOPAMAX) 25 MG tablet Take 2 tablets (50 mg total) by mouth 2 (two) times daily.  120 tablet  3   No facility-administered medications prior to visit.    Allergies: No Known Allergies  History   Social History  . Marital Status: Single    Spouse Name: N/A    Number of Children: N/A  .  Years of Education: N/A   Occupational History  . Not on file.   Social History Main Topics  . Smoking status: Never Smoker   . Smokeless tobacco: Never Used     Comment: Never smoked  . Alcohol Use: Yes     Comment: rare  . Drug Use: No  . Sexual Activity: Yes    Partners: Male    Birth Control/ Protection: Pill   Other Topics Concern  . Not on file   Social History Narrative  . No narrative on file    Family History  Problem Relation Age of Onset  . Diabetes Mother     gastroparesis  . Hypertension Mother   . Anxiety disorder Mother   . Hypertension Father   . Diabetes Maternal Grandmother   . Hypertension Maternal Grandmother   . Hypertension Paternal Grandmother   . Inflammatory bowel disease Neg Hx   . Colon cancer Neg Hx      Review of  Systems:  See HPI for pertinent ROS. All other ROS negative.    Physical Exam: Blood pressure 122/68, pulse 72, temperature 97.9 F (36.6 C), temperature source Oral, resp. rate 14, height 5\' 6"  (1.676 m), weight 194 lb (87.998 kg)., Body mass index is 31.33 kg/(m^2). General: WNWD WF. Appears in no acute distress. Neck: Supple. No thyromegaly. No lymphadenopathy. Lungs: Clear bilaterally to auscultation without wheezes, rales, or rhonchi. Breathing is unlabored. Heart: RRR with S1 S2. No murmurs, rubs, or gallops. Breasts: Normal. Symmetrical. No masses. No nipple discharge. No skin changes/rash. Abdomen: Soft, non-tender, non-distended with normoactive bowel sounds. No hepatomegaly. No rebound/guarding. No obvious abdominal masses. Pelvic Exam: External genitalia normal. Vaginal mucosa normal. Cervix normal. Bimanual exam is normal with normal uterus size and no adnexal mass. No cervical motion tenderness. Pap smear obtained.  Musculoskeletal:  Strength and tone normal for age. No swelling of joints.  Extremities/Skin: Warm and dry.  No edema. No rashes or suspicious lesions. Neuro: Alert and oriented X 3. Moves all extremities spontaneously. Gait is normal. CNII-XII grossly in tact. Psych:  Responds to questions appropriately with a normal affect.     ASSESSMENT AND PLAN:  28 y.o. year old female with  1. Gluten intolerance  2. Abdominal pain, epigastric  3. Lower abdominal pain  4. Diarrhea  5. Constipation, unspecified constipation type  6. Other fatigue - CBC with Differential - COMPLETE METABOLIC PANEL WITH GFR - TSH - ANA  7. Chronic pain of multiple joints - ANA  8. Encounter for surveillance of contraceptive pills - Pap IG (Image Guided)  9. Encounter for other contraceptive management - Norethindrone-Ethinyl Estradiol-Fe Biphas (LO LOESTRIN FE) 1 MG-10 MCG / 10 MCG tablet; Take 1 tablet by mouth daily.  Dispense: 1 Package; Refill: [address]   Signed, Mary Beth  LulaDixon, GeorgiaPA, [facility] 03/27/2014 2:29 PM

## 2014-03-28 ENCOUNTER — Other Ambulatory Visit: Payer: Self-pay | Admitting: Orthopedic Surgery

## 2014-03-28 DIAGNOSIS — M25562 Pain in left knee: Secondary | ICD-10-CM

## 2014-03-28 LAB — CBC WITH DIFFERENTIAL/PLATELET
BASOS ABS: 0.1 10*3/uL (ref 0.0–0.1)
Basophils Relative: 1 % (ref 0–1)
EOS PCT: 2 % (ref 0–5)
Eosinophils Absolute: 0.1 10*3/uL (ref 0.0–0.7)
HEMATOCRIT: 39.5 % (ref 36.0–46.0)
Hemoglobin: 13.1 g/dL (ref 12.0–15.0)
LYMPHS PCT: 32 % (ref 12–46)
Lymphs Abs: 1.6 10*3/uL (ref 0.7–4.0)
MCH: 27.9 pg (ref 26.0–34.0)
MCHC: 33.2 g/dL (ref 30.0–36.0)
MCV: 84.2 fL (ref 78.0–100.0)
MONO ABS: 0.4 10*3/uL (ref 0.1–1.0)
MONOS PCT: 7 % (ref 3–12)
Neutro Abs: 3 10*3/uL (ref 1.7–7.7)
Neutrophils Relative %: 58 % (ref 43–77)
Platelets: 237 10*3/uL (ref 150–400)
RBC: 4.69 MIL/uL (ref 3.87–5.11)
RDW: 13.2 % (ref 11.5–15.5)
WBC: 5.1 10*3/uL (ref 4.0–10.5)

## 2014-03-28 LAB — COMPLETE METABOLIC PANEL WITH GFR
ALBUMIN: 4.1 g/dL (ref 3.5–5.2)
ALT: 10 U/L (ref 0–35)
AST: 14 U/L (ref 0–37)
Alkaline Phosphatase: 29 U/L — ABNORMAL LOW (ref 39–117)
BUN: 12 mg/dL (ref 6–23)
CHLORIDE: 102 meq/L (ref 96–112)
CO2: 27 meq/L (ref 19–32)
Calcium: 8.3 mg/dL — ABNORMAL LOW (ref 8.4–10.5)
Creat: 1.03 mg/dL (ref 0.50–1.10)
GFR, EST AFRICAN AMERICAN: 86 mL/min
GFR, Est Non African American: 75 mL/min
GLUCOSE: 82 mg/dL (ref 70–99)
Potassium: 3.7 mEq/L (ref 3.5–5.3)
Sodium: 138 mEq/L (ref 135–145)
Total Bilirubin: 0.2 mg/dL (ref 0.2–1.2)
Total Protein: 6.3 g/dL (ref 6.0–8.3)

## 2014-03-28 LAB — PAP IG (IMAGE GUIDED)

## 2014-03-28 LAB — TSH: TSH: 1.636 u[IU]/mL (ref 0.350–4.500)

## 2014-03-28 LAB — ANA: Anti Nuclear Antibody(ANA): NEGATIVE

## 2014-03-31 ENCOUNTER — Ambulatory Visit (INDEPENDENT_AMBULATORY_CARE_PROVIDER_SITE_OTHER): Payer: BC Managed Care – PPO | Admitting: Orthopedic Surgery

## 2014-03-31 ENCOUNTER — Encounter: Payer: Self-pay | Admitting: Family Medicine

## 2014-03-31 ENCOUNTER — Encounter: Payer: Self-pay | Admitting: Orthopedic Surgery

## 2014-03-31 ENCOUNTER — Ambulatory Visit (HOSPITAL_COMMUNITY)
Admission: RE | Admit: 2014-03-31 | Discharge: 2014-03-31 | Disposition: A | Discharge status: Home / Self Care | Payer: BC Managed Care – PPO | Source: Ambulatory Visit | Attending: Orthopedic Surgery | Admitting: Orthopedic Surgery

## 2014-03-31 VITALS — BP 121/69 | Ht 66.0 in | Wt 194.0 lb

## 2014-03-31 DIAGNOSIS — M25562 Pain in left knee: Secondary | ICD-10-CM

## 2014-03-31 DIAGNOSIS — M25569 Pain in unspecified knee: Secondary | ICD-10-CM

## 2014-03-31 DIAGNOSIS — G8929 Other chronic pain: Secondary | ICD-10-CM | POA: Insufficient documentation

## 2014-03-31 NOTE — Patient Instructions (Signed)
Call to arrange therapy 

## 2014-03-31 NOTE — Progress Notes (Signed)
Patient ID: Dominique Davis, female   DOB: 10-10-1985, 28 y.o.   MRN: [id number] Subjective:    Dominique Davis is a 28 y.o. female referred by Dr Ladona Ridgelaylor  for evaluation and treatment of left knee pain. This is evaluated as a personal injury. The pain began several years ago. The pain's location is anterior. She describes the symptoms as aching, dull and throbbing. Symptoms improve with rest. Symptoms worsen with stair climbing, standing, walking and squatting. The knee has given out and  felt unstable. The patient can bend and straighten the knee fully. Previous treatment none other than ibuprofen which occasionally works Outside reports reviewed: none.  The following portions of the patient's history were reviewed and updated as appropriate: allergies, current medications, past family history, past medical history, past social history, past surgical history and problem list.    Objective:      General :   alert and cooperative  Gait: Normal. The patient can bear weight on the injured extremity.  Right Lower Extremity Hip Palpation:  no tenderness over the greater  trochanter  Hip ROM: 100% of normal  Hamstring tightness reveals a popliteal angle to be 25 degrees  Knee Effusion:  None.  Ecchymosis:  none  Knee ROM:  0 to 135 degrees with subpatellar   crepitance.  Patella:  Patella does track normally. Patellar apprehension test: negative Patellar compression test: negative  Tenderness:  none   Stability:  Lachman's test: negative Posterior drawer: negative Medial collateral ligament: negative Lateral collateral ligament: negative    Ober Test:  negative  McMurray's Test:  negative with no joint line tenderness  Sensation:   intact to light touch  Pulses: normal DP and PT pulses  Left lower extremity No effusion is seen normal range of motion is noted, stability tests are normal, strength is normal, skin is normal, pulse orientation normal. She does have prominence over the tibial  tubercle.  Imaging X-rays: 4 views of the knee demonstrate no remarkable findings.    Assessment:    patellofemoral syndromeleft knee.    Plan:    Questions solicited and answered. Patient voiced understanding to info/instructions given. Release from care. Physical therapy ordered

## 2014-04-22 ENCOUNTER — Telehealth: Payer: Self-pay

## 2014-04-22 MED ORDER — CIPROFLOXACIN HCL 500 MG PO TABS: 500.0000 mg | ORAL_TABLET | Freq: Two times a day (BID) | ORAL | Status: DC

## 2014-04-22 MED ORDER — METRONIDAZOLE 500 MG PO TABS: 500.0000 mg | ORAL_TABLET | Freq: Two times a day (BID) | ORAL | Status: DC

## 2014-04-22 NOTE — Telephone Encounter (Signed)
Pt is calling to make an appointment. I told her the first thing we had was 05/26/14.She does not want to wait that long if possible. She is having abd pain and cramping. Constipation and weak. No fever. She said that her small bowel bacterial overgrowth came back possible and she does not want to get back like she was before. Please advise

## 2014-04-22 NOTE — Telephone Encounter (Signed)
Pt is aware of Rx and has a follow up with [facility] on 06/11/14 @ 3:00

## 2014-04-22 NOTE — Telephone Encounter (Signed)
Treat with Cipro/Flagyl for 7 days. RX done. Continue probiotic daily.  She was supposed to have OV this month with [facility]. Patient never called to make appointment after letter sent. Please arrange OV with [facility].

## 2014-06-02 ENCOUNTER — Ambulatory Visit (INDEPENDENT_AMBULATORY_CARE_PROVIDER_SITE_OTHER): Payer: BC Managed Care – PPO | Admitting: Physician Assistant

## 2014-06-02 ENCOUNTER — Encounter: Payer: Self-pay | Admitting: Physician Assistant

## 2014-06-02 VITALS — BP 118/72 | HR 72 | Temp 98.4°F | Resp 18 | Wt 204.0 lb

## 2014-06-02 DIAGNOSIS — T148 Other injury of unspecified body region: Secondary | ICD-10-CM

## 2014-06-02 DIAGNOSIS — W57XXXA Bitten or stung by nonvenomous insect and other nonvenomous arthropods, initial encounter: Secondary | ICD-10-CM

## 2014-06-02 NOTE — Progress Notes (Signed)
    Patient ID: Dominique Davis MRN: [id number], DOB: 1986/05/11, 28 y.o. Date of Encounter: 06/02/2014, 3:02 PM    Chief Complaint:  Chief Complaint  Patient presents with  . pulled tick off her 2 days ago    site sore and itchy     HPI: 28 y.o. year old white female says that she found a tic on her right flank --it was really stuck and was hard to remove. Still has small bump at site--so thought she should get it checked.  No other rash. No fever, no myalgias, no malaise, no headache, no other symptoms. Feels fine.     Home Meds:   Outpatient Prescriptions Prior to Visit  Medication Sig Dispense Refill  . EPIPEN 2-PAK 0.3 MG/0.3ML SOAJ injection Inject 0.3 mg into the muscle as needed (allergic reaction).     . fluticasone (FLONASE) 50 MCG/ACT nasal spray Place 2 sprays into both nostrils daily. As needed    . Linaclotide (LINZESS) 145 MCG CAPS capsule 1 PO 30 mins prior to your first meal 30 capsule 11  . Norethindrone-Ethinyl Estradiol-Fe Biphas (LO LOESTRIN FE) 1 MG-10 MCG / 10 MCG tablet Take 1 tablet by mouth daily. 1 Package 11  . triamcinolone (NASACORT ALLERGY 24HR) 55 MCG/ACT AERO nasal inhaler Place 2 sprays into the nose daily. As needed    . ciprofloxacin (CIPRO) 500 MG tablet Take 1 tablet (500 mg total) by mouth 2 (two) times daily. 14 tablet 0  . metroNIDAZOLE (FLAGYL) 500 MG tablet Take 1 tablet (500 mg total) by mouth 2 (two) times daily. 14 tablet 0   No facility-administered medications prior to visit.    Allergies: No Known Allergies    Review of Systems: See HPI for pertinent ROS. All other ROS negative.    Physical Exam: Blood pressure 118/72, pulse 72, temperature 98.4 F (36.9 C), temperature source Oral, resp. rate 18, weight 204 lb (92.534 kg)., Body mass index is 32.94 kg/(m^2). General:  Obese WF. Appears in no acute distress. Neck: Supple. No thyromegaly. No lymphadenopathy. Lungs: Clear bilaterally to auscultation without wheezes, rales, or  rhonchi. Breathing is unlabored. Heart: Regular rhythm. No murmurs, rubs, or gallops. Msk:  Strength and tone normal for age. Skin: Right Flank/About level of pant waist line--  pink papule-- slightly raised, <1/2 cm diameter. No other rash.  Neuro: Alert and oriented X 3. Moves all extremities spontaneously. Gait is normal. CNII-XII grossly in tact. Psych:  Responds to questions appropriately with a normal affect.     ASSESSMENT AND PLAN:  28 y.o. year old female with  1. Tick bite Reassured her this is c/w localized reaction/inflammation.  F/U if develops any rash or signs/symptoms listed in HPI.    Murray HodgkinsSigned, Mary Beth FlorenceDixon, GeorgiaPA, [facility] 06/02/2014 3:02 PM

## 2014-06-11 ENCOUNTER — Encounter: Payer: Self-pay | Admitting: Gastroenterology

## 2014-06-11 ENCOUNTER — Encounter: Payer: BC Managed Care – PPO | Admitting: Gastroenterology

## 2014-06-11 NOTE — Progress Notes (Signed)
   Subjective:    Patient ID: Dominique Davis, female    DOB: [birth year], 29 y.o.   MRN: [id number]  HPI   Past Medical History  Diagnosis Date  . Dysmenorrhea   . Anxiety   . Obesity   . Migraines   . Depression   . Small intestinal bacterial overgrowth JUL 2015    CIP/FLAG X7 DAYS(2X 2015)   Past Surgical History  Procedure Laterality Date  . Wisdom tooth extraction      x2  . Bacterial overgrowth test N/A 01/03/2014    0 PPM AT START, 65 PPM(75 MINS), 58 PPM(105 MINS)  . Bacterial overgrowth test N/A 01/10/2014    Procedure: BACTERIAL OVERGROWTH TEST;  Surgeon: West BaliSandi L Lanard Arguijo, MD;  Location: [facility];  Service: Endoscopy;  Laterality: N/A;  730   No Known Allergies       Review of Systems     Objective:   Physical Exam        Assessment & Plan:

## 2014-08-09 IMAGING — CT CT ABD-PELV W/ CM
2 of 3 series · 16 of 46 positions shown, 18 images · IV contrast (Omnipaque 300)
Comparison: None.

CLINICAL DATA: Lower abdominal pain and weight loss.

EXAM:
CT ABDOMEN AND PELVIS WITH CONTRAST
TECHNIQUE: Multidetector CT imaging of the abdomen and pelvis was performed
using the standard protocol following bolus administration of
intravenous contrast.
CONTRAST:  100mL OMNIPAQUE IOHEXOL 300 MG/ML  SOLN

[Series 2: abd_pel_with 5.0 b40f · axial · 0.67mm/px · z∈[-376,+14]mm · 13 of 90 slices shown, 15 images]
[im 6/90  soft-tissue]
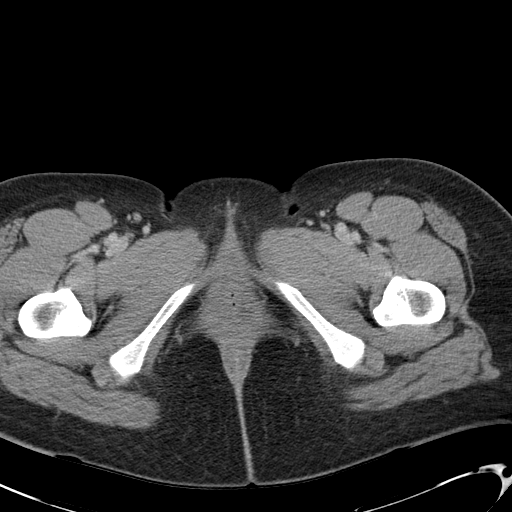
[im 6/90  bone]
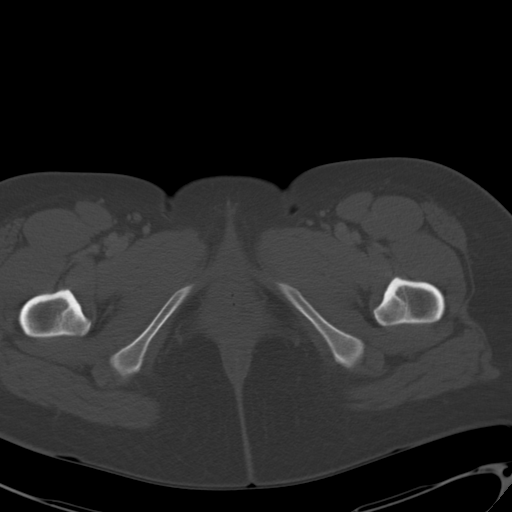
[im 12/90  soft-tissue]
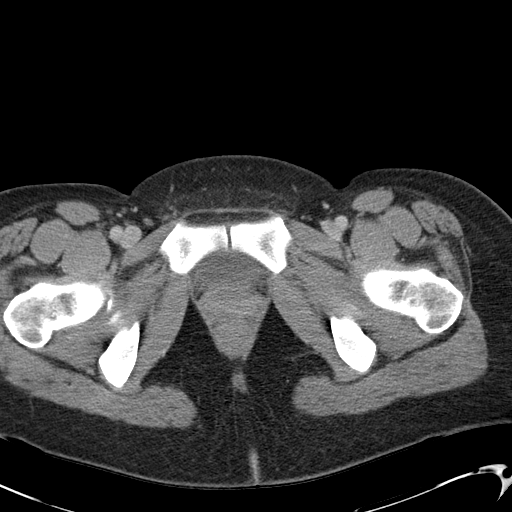
[im 18/90  soft-tissue]
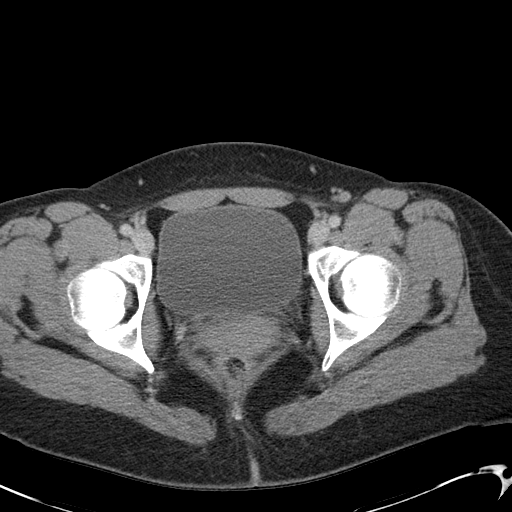
[im 26/90  soft-tissue]
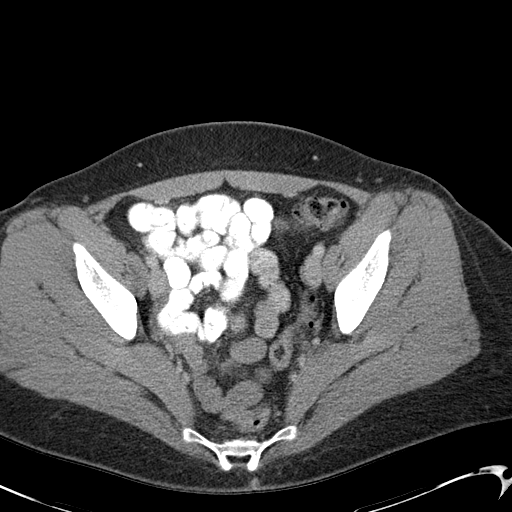
[im 32/90  soft-tissue]
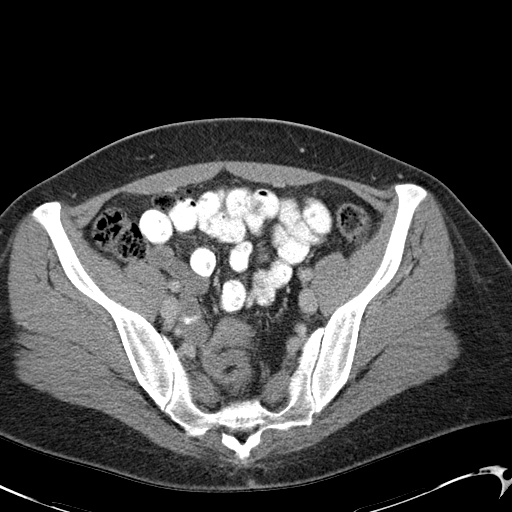
[im 38/90  soft-tissue]
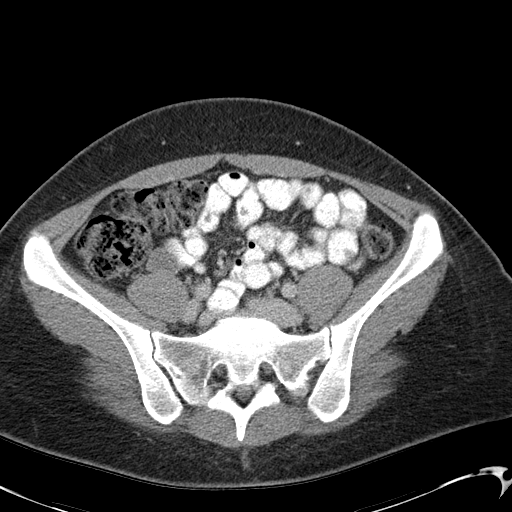
[im 46/90  soft-tissue]
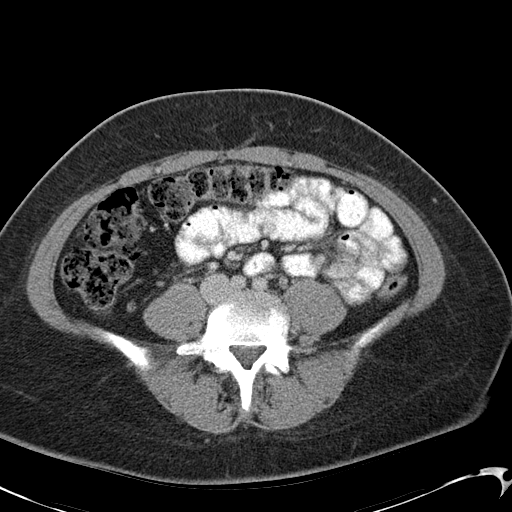
[im 52/90  soft-tissue]
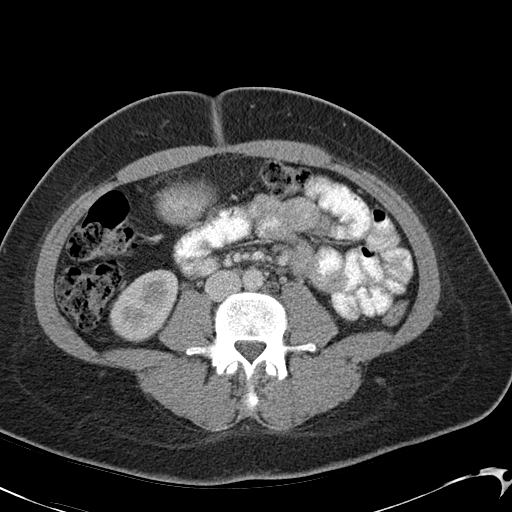
[im 58/90  soft-tissue]
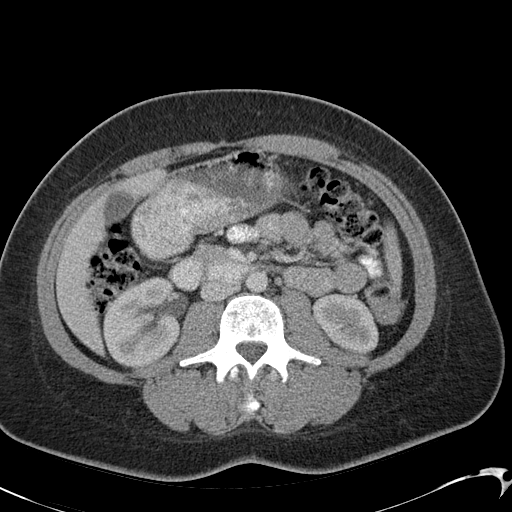
[im 58/90  bone]
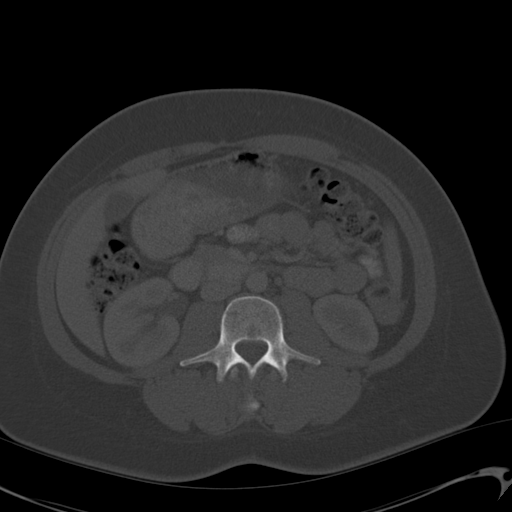
[im 64/90  soft-tissue]
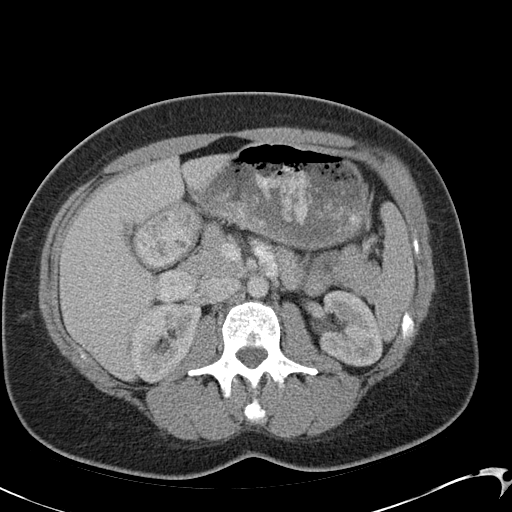
[im 72/90  soft-tissue]
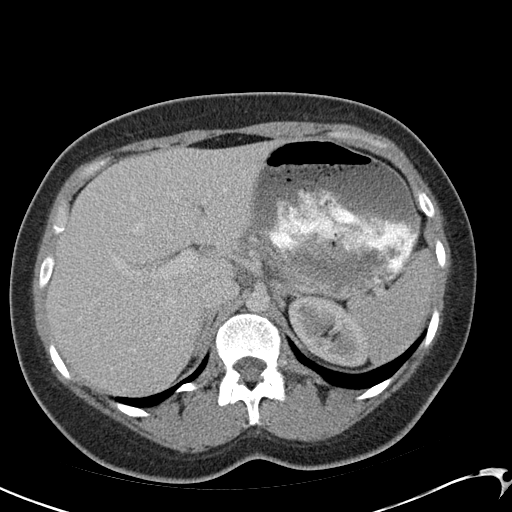
[im 78/90  soft-tissue]
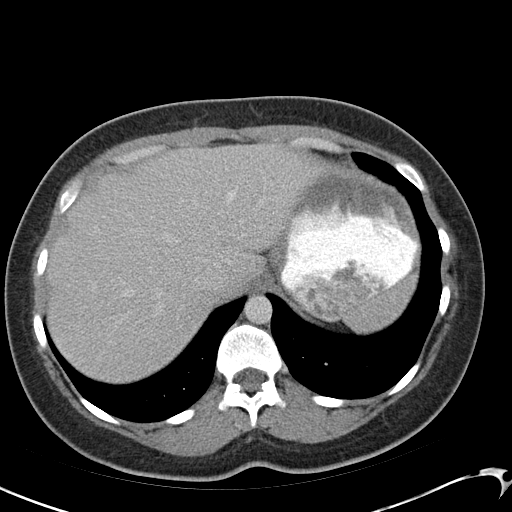
[im 84/90  soft-tissue]
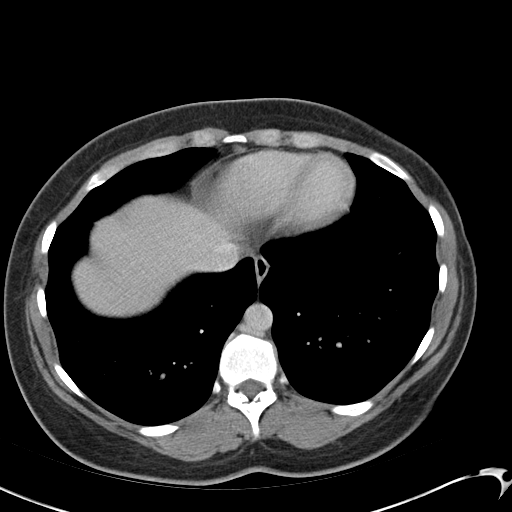

[Series 4: abd_pel_with 3.0 spo cor · coronal · 0.63mm/px · 3 of 90 slices shown]
[im 30/90  soft-tissue]
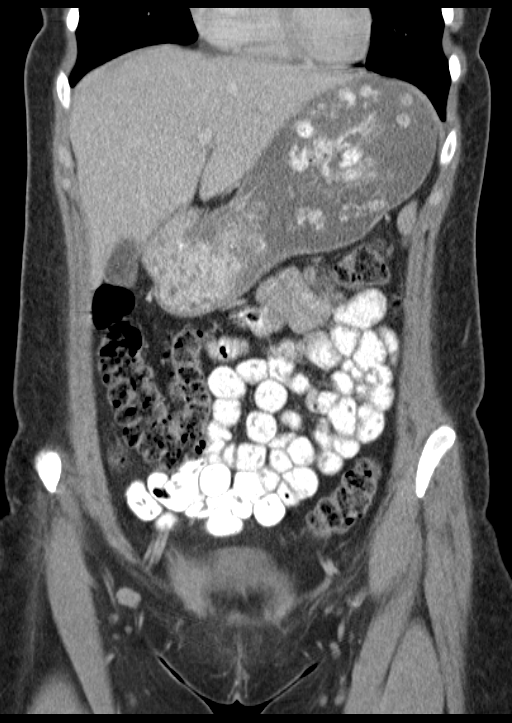
[im 40/90  soft-tissue]
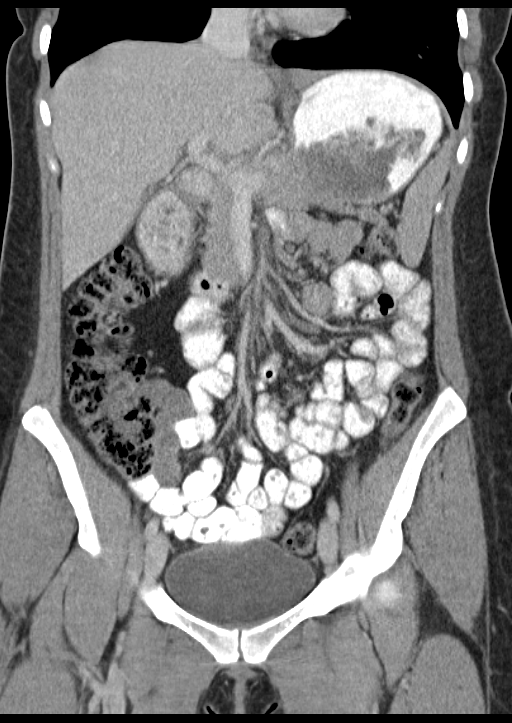
[im 50/90  soft-tissue]
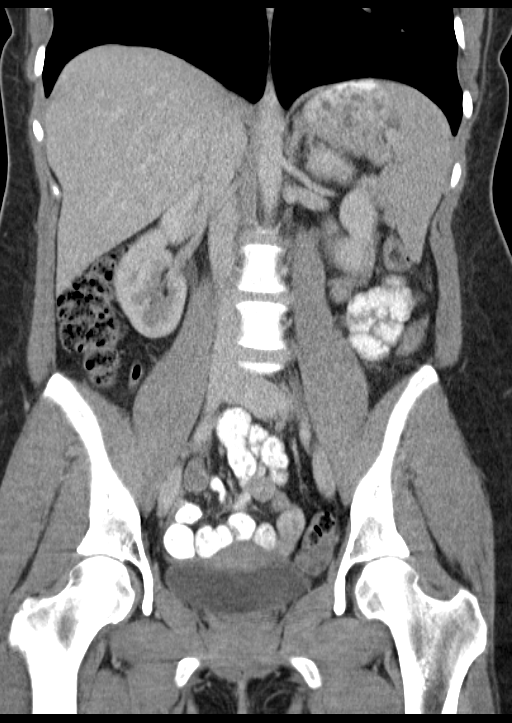

[16 of 46 positions shown; findings below may reference images not displayed]

FINDINGS: The lung bases are clear. No pleural effusion or pulmonary nodule.
The heart is normal in size. No pericardial effusion the distal
esophagus is unremarkable.

The liver is unremarkable. No focal hepatic lesions or intrahepatic
biliary dilatation. Gallbladder is mildly contracted. No common
bowel duct dilatation. The pancreas is normal. She spleen is normal.
The adrenal glands and kidneys are normal.

The stomach, duodenum, small bowel and colon are unremarkable. No
inflammatory changes, mass lesions or obstructive findings. Moderate
stool throughout the colon may suggest constipation. No mesenteric
or retroperitoneal mass or adenopathy. Small scattered lymph nodes
are noted. The aorta and branch vessels are normal. The major venous
structures are patent.

The uterus and ovaries are normal. The bladder is normal. No pelvic
mass, adenopathy or free pelvic fluid collections. No inguinal mass
or adenopathy.
IMPRESSION: No acute abdominal/pelvic findings, mass lesions or adenopathy.

## 2014-08-22 ENCOUNTER — Encounter: Payer: Self-pay | Admitting: Family Medicine

## 2014-08-22 ENCOUNTER — Ambulatory Visit (INDEPENDENT_AMBULATORY_CARE_PROVIDER_SITE_OTHER): Payer: Self-pay | Admitting: Family Medicine

## 2014-08-22 VITALS — BP 122/80 | HR 80 | Temp 98.0°F | Resp 16 | Ht 67.0 in | Wt 207.0 lb

## 2014-08-22 DIAGNOSIS — N926 Irregular menstruation, unspecified: Secondary | ICD-10-CM

## 2014-08-22 DIAGNOSIS — R350 Frequency of micturition: Secondary | ICD-10-CM

## 2014-08-22 LAB — URINALYSIS, ROUTINE W REFLEX MICROSCOPIC
BILIRUBIN URINE: NEGATIVE
GLUCOSE, UA: NEGATIVE mg/dL
HGB URINE DIPSTICK: NEGATIVE
KETONES UR: NEGATIVE mg/dL
Nitrite: NEGATIVE
PROTEIN: NEGATIVE mg/dL
Specific Gravity, Urine: 1.01 (ref 1.005–1.030)
Urobilinogen, UA: 0.2 mg/dL (ref 0.0–1.0)
pH: 7 (ref 5.0–8.0)

## 2014-08-22 LAB — PREGNANCY, URINE: PREG TEST UR: NEGATIVE

## 2014-08-22 LAB — URINALYSIS, MICROSCOPIC ONLY
Casts: NONE SEEN
Crystals: NONE SEEN

## 2014-08-22 MED ORDER — CIPROFLOXACIN HCL 500 MG PO TABS: 500.0000 mg | ORAL_TABLET | Freq: Two times a day (BID) | ORAL | Status: DC

## 2014-08-22 NOTE — Progress Notes (Signed)
Subjective:    Patient ID: Dominique Davis, female    DOB: 1986-04-09, 29 y.o.   MRN: [id number]  HPI  Last week, the patient has had pelvic pressure and bloating. She also reports frequent urination. She reports that occurs to urinate but when she goes to the restroom she voids very little and she also reports some discomfort with urination. She is on birth control pills to treat her dysmenorrhea. She does not take the placebo pills and therefore she does not have regular periods. Her last sexual activity was 1 week ago. She denies any recent weight gain or nausea or vomiting. Urine pregnancy test today was negative. She has some occasional vaginal discharge. She also reports some mild occasional pain with intercourse that is chronic. Past Medical History  Diagnosis Date  . Dysmenorrhea   . Anxiety   . Obesity   . Migraines   . Depression   . Small intestinal bacterial overgrowth AUG 2015    CIP/FLAG X7 DAYS(2X 2015)   Past Surgical History  Procedure Laterality Date  . Wisdom tooth extraction      x2  . Bacterial overgrowth test N/A 01/03/2014    CANCELLED  . Bacterial overgrowth test N/A 01/10/2014    0 PPM AT START, 65 PPM(75 MINS), 58 PPM(105 MINS)   Current Outpatient Prescriptions on File Prior to Visit  Medication Sig Dispense Refill  . EPIPEN 2-PAK 0.3 MG/0.3ML SOAJ injection Inject 0.3 mg into the muscle as needed (allergic reaction).     . Norethindrone-Ethinyl Estradiol-Fe Biphas (LO LOESTRIN FE) 1 MG-10 MCG / 10 MCG tablet Take 1 tablet by mouth daily. 1 Package 11  . triamcinolone (NASACORT ALLERGY 24HR) 55 MCG/ACT AERO nasal inhaler Place 2 sprays into the nose daily. As needed    . Linaclotide (LINZESS) 145 MCG CAPS capsule 1 PO 30 mins prior to your first meal (Patient not taking: Reported on 08/22/2014) 30 capsule 11   No current facility-administered medications on file prior to visit.   No Known Allergies History   Social History  . Marital Status: Single   Spouse Name: N/A  . Number of Children: N/A  . Years of Education: N/A   Occupational History  . Not on file.   Social History Main Topics  . Smoking status: Never Smoker   . Smokeless tobacco: Never Used     Comment: Never smoked  . Alcohol Use: Yes     Comment: rare  . Drug Use: No  . Sexual Activity:    Partners: Male    Birth Control/ Protection: Pill   Other Topics Concern  . Not on file   Social History Narrative     Review of Systems  All other systems reviewed and are negative.      Objective:   Physical Exam  Constitutional: She appears well-developed and well-nourished.  Cardiovascular: Normal rate, regular rhythm and normal heart sounds.   Pulmonary/Chest: Effort normal and breath sounds normal. No respiratory distress. She has no wheezes. She has no rales.  Abdominal: Soft. Bowel sounds are normal. She exhibits no distension and no mass. There is no tenderness. There is no rebound and no guarding.  Vitals reviewed.         Assessment & Plan:  Frequent urination - Plan: Urinalysis, Routine w reflex microscopic  Menses, irregular - Plan: Pregnancy, urine  Urine sample shows trace leukocyte esterase, occasional white blood cells and occasional bacteria. I believe she is having a bladder infection. Begin Cipro 500  mg by mouth twice a day for 3 days. Recheck next week if no better

## 2014-08-22 NOTE — Addendum Note (Signed)
Addended by: Netty StarringMECKELSON, Daimion Adamcik on: 08/22/2014 12:06 PM   Modules accepted: Orders

## 2014-08-24 LAB — URINE CULTURE: Colony Count: 50000

## 2014-08-25 ENCOUNTER — Telehealth: Payer: Self-pay | Admitting: Family Medicine

## 2014-08-25 NOTE — Telephone Encounter (Signed)
[phone] Pt came in Friday and saw Dr Tanya NonesPickard and was told if not better to come back in today she states she has took her antibiotic and is almost better but is wanting to know if she could have another round of antibiotic called in instead of her coming in.

## 2014-08-25 NOTE — Telephone Encounter (Signed)
LMOVM about results and that if she is still having problems then she would need additional testing. Did not leave what type of test on vm.

## 2014-08-25 NOTE — Telephone Encounter (Signed)
Please see the urine culture and the results. There was no urinary tract infection just contamination. If she is still having symptoms are recommended a pelvic exam with a wet prep, GC, chlamydia testing. Her symptoms do not appear to be a urinary tract infection based on the normal urine culture

## 2014-08-26 ENCOUNTER — Encounter: Payer: Self-pay | Admitting: Family Medicine

## 2014-08-26 ENCOUNTER — Ambulatory Visit (INDEPENDENT_AMBULATORY_CARE_PROVIDER_SITE_OTHER): Payer: Self-pay | Admitting: Family Medicine

## 2014-08-26 VITALS — BP 118/62 | HR 76 | Temp 98.6°F | Resp 18 | Ht 67.0 in | Wt 203.0 lb

## 2014-08-26 DIAGNOSIS — N760 Acute vaginitis: Secondary | ICD-10-CM

## 2014-08-26 LAB — WET PREP FOR TRICH, YEAST, CLUE
Clue Cells Wet Prep HPF POC: NONE SEEN
Trich, Wet Prep: NONE SEEN
Yeast Wet Prep HPF POC: NONE SEEN

## 2014-08-26 NOTE — Progress Notes (Signed)
Patient ID: Dominique Davis, female   DOB: 06-May-1986, 29 y.o.   MRN: [id number]   Subjective:    Patient ID: Dominique BelfastVictoria G Davis, female    DOB: 06-May-1986, 29 y.o.   MRN: [id number]  Patient presents for Pelvic Exam  patient presents for pelvic exam. She's been having some vaginal discharge as well as some pressure with urinating. She came in on Friday her urine culture however was negative, she did take Cipro 500 mg twice a day for 3 days and she improved a little. She's also been drinking cranberry juice. She's not had any abnormal bleeding she's not had a menstrual cycle in about 3 years. No abd pain    Review Of Systems:  GEN- denies fatigue, fever, weight loss,weakness, recent illness HEENT- denies eye drainage, change in vision, nasal discharge, CVS- denies chest pain, palpitations RESP- denies SOB, cough, wheeze ABD- denies N/V, change in stools, abd pain GU-+dysuria, denies hematuria, dribbling, incontinence MSK- denies joint pain, muscle aches, injury Neuro- denies headache, dizziness, syncope, seizure activity       Objective:    BP 118/62 mmHg  Pulse 76  Temp(Src) 98.6 F (37 C) (Oral)  Resp 18  Ht 5\' 7"  (1.702 m)  Wt 203 lb (92.08 kg)  BMI 31.79 kg/m2 GEN- NAD, alert and oriented x3 GU- normal external genitalia, vaginal mucosa pink and moist, cervix visualized no growth, no blood form os, +  discharge, no CMT, no ovarian masses, uterus normal size       Assessment & Plan:      Problem List Items Addressed This Visit    None    Visit Diagnoses    Vaginitis and vulvovaginitis    -  Primary    Wet prep, GC cultures taken, treat based on resulst, UC is negative    Relevant Orders    GC/Chlamydia Probe Amp    WET PREP FOR TRICH, YEAST, CLUE       Note: This dictation was prepared with Dragon dictation along with smaller Lobbyistphrase technology. Any transcriptional errors that result from this process are unintentional.

## 2014-08-26 NOTE — Patient Instructions (Signed)
We will call with results Continue current medications F/U as needed

## 2014-08-27 LAB — GC/CHLAMYDIA PROBE AMP
CT PROBE, AMP APTIMA: NEGATIVE
GC Probe RNA: NEGATIVE

## 2014-08-27 NOTE — Telephone Encounter (Signed)
Patient seen by [facility].

## 2014-12-30 ENCOUNTER — Encounter: Payer: Self-pay | Admitting: Family Medicine

## 2014-12-30 ENCOUNTER — Ambulatory Visit (INDEPENDENT_AMBULATORY_CARE_PROVIDER_SITE_OTHER): Payer: 59 | Admitting: Family Medicine

## 2014-12-30 VITALS — BP 128/62 | HR 86 | Temp 98.4°F | Resp 14 | Ht 67.0 in | Wt 207.0 lb

## 2014-12-30 DIAGNOSIS — K9041 Non-celiac gluten sensitivity: Secondary | ICD-10-CM

## 2014-12-30 DIAGNOSIS — K900 Celiac disease: Secondary | ICD-10-CM | POA: Diagnosis not present

## 2014-12-30 DIAGNOSIS — E669 Obesity, unspecified: Secondary | ICD-10-CM | POA: Diagnosis not present

## 2014-12-30 DIAGNOSIS — R300 Dysuria: Secondary | ICD-10-CM

## 2014-12-30 DIAGNOSIS — Z23 Encounter for immunization: Secondary | ICD-10-CM | POA: Diagnosis not present

## 2014-12-30 DIAGNOSIS — M549 Dorsalgia, unspecified: Secondary | ICD-10-CM

## 2014-12-30 DIAGNOSIS — G8929 Other chronic pain: Secondary | ICD-10-CM

## 2014-12-30 LAB — URINALYSIS, ROUTINE W REFLEX MICROSCOPIC
BILIRUBIN URINE: NEGATIVE
Glucose, UA: NEGATIVE
Ketones, ur: NEGATIVE
LEUKOCYTES UA: NEGATIVE
Nitrite: NEGATIVE
Specific Gravity, Urine: >1.03 (ref 1.001–1.035)
pH: 6 (ref 5.0–8.0)

## 2014-12-30 LAB — URINALYSIS, MICROSCOPIC ONLY
CASTS: NONE SEEN [LPF]
Crystals: NONE SEEN [HPF]

## 2014-12-30 MED ORDER — PHENTERMINE HCL 37.5 MG PO TABS: 37.5000 mg | ORAL_TABLET | Freq: Every day | ORAL | Status: DC

## 2014-12-30 MED ORDER — METHOCARBAMOL 500 MG PO TABS: 500.0000 mg | ORAL_TABLET | Freq: Three times a day (TID) | ORAL | Status: DC | PRN

## 2014-12-30 NOTE — Assessment & Plan Note (Signed)
Trial of Robaxin

## 2014-12-30 NOTE — Assessment & Plan Note (Signed)
Increasing weight which also makes her chronic back pain worse. Plan to try her on the phentermine again we discussed the use of the medication as well as side effects. She is also to track her calories which will be somewhere between 15-1800. And continue her current aerobic exercise program

## 2014-12-30 NOTE — Patient Instructions (Signed)
Try the robaxin for muscle relaxation Phentermine - start with 1/2 tablet for 2 weeks, then increase to 1 tablet  Get the labs done at work - fax back  F/U 2 months
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
## 2015-01-01 ENCOUNTER — Encounter: Payer: Self-pay | Admitting: Family Medicine

## 2015-01-01 LAB — URINE CULTURE

## 2015-01-04 NOTE — Progress Notes (Signed)
REVIEWED-NO ADDITIONAL RECOMMENDATIONS. 

## 2015-01-08 ENCOUNTER — Telehealth: Payer: Self-pay | Admitting: *Deleted

## 2015-01-08 NOTE — Telephone Encounter (Signed)
Received lab results from [facility].   MD reviewed and states that all labs are WNL. Patient is not diabetic.   Call placed to patient and patient made aware.

## 2015-02-16 ENCOUNTER — Telehealth: Payer: Self-pay | Admitting: Family Medicine

## 2015-02-16 DIAGNOSIS — Z308 Encounter for other contraceptive management: Secondary | ICD-10-CM

## 2015-02-16 MED ORDER — NORETHIN-ETH ESTRAD-FE BIPHAS 1 MG-10 MCG / 10 MCG PO TABS: 1.0000 | ORAL_TABLET | Freq: Every day | ORAL | Status: DC

## 2015-02-16 NOTE — Telephone Encounter (Signed)
Pt is calling to see if she needs an appointment to have her birth control refilled or if we can just call it in. Ph # [phone] [facility] pharmacy

## 2015-02-16 NOTE — Telephone Encounter (Signed)
Patient recently seen in July.   Has been taking BC routinely.   Refill appropriate and filled per protocol x1 year.

## 2015-02-19 ENCOUNTER — Encounter: Payer: Self-pay | Admitting: Family Medicine

## 2015-03-04 ENCOUNTER — Encounter: Payer: Self-pay | Admitting: Family Medicine

## 2015-03-04 ENCOUNTER — Ambulatory Visit (INDEPENDENT_AMBULATORY_CARE_PROVIDER_SITE_OTHER): Payer: 59 | Admitting: Family Medicine

## 2015-03-04 VITALS — BP 126/68 | HR 72 | Temp 98.2°F | Resp 14 | Ht 67.0 in | Wt 196.0 lb

## 2015-03-04 DIAGNOSIS — Z23 Encounter for immunization: Secondary | ICD-10-CM

## 2015-03-04 DIAGNOSIS — Z91018 Allergy to other foods: Secondary | ICD-10-CM | POA: Diagnosis not present

## 2015-03-04 DIAGNOSIS — E669 Obesity, unspecified: Secondary | ICD-10-CM | POA: Diagnosis not present

## 2015-03-04 HISTORY — DX: Allergy to other foods: Z91.018

## 2015-03-04 MED ORDER — PHENTERMINE HCL 37.5 MG PO TABS: 37.5000 mg | ORAL_TABLET | Freq: Every day | ORAL | Status: DC

## 2015-03-04 NOTE — Addendum Note (Signed)
Addended by: Phillips Odor on: 03/04/2015 09:11 AM   Modules accepted: Orders

## 2015-03-04 NOTE — Patient Instructions (Signed)
Continue current meds F/U 3 months

## 2015-03-04 NOTE — Assessment & Plan Note (Signed)
Weight is improving, she is down more than 5% of body weight in 8 weeks, will continue phentermine at current dose Discussed aerobic program and muscle toning throuhout the weight loss Will check milk protein allergy but she is using substitutes such as soy and almond milk Flu shot given

## 2015-03-04 NOTE — Progress Notes (Signed)
Patient ID: Dominique Davis, female   DOB: 07-08-1985, 29 y.o.   MRN: [id number]   Subjective:    Patient ID: Dominique Davis, female    DOB: [birth year], 29 y.o.   MRN: [id number]  Patient presents for 2 month F/U  Pt here to f/u phentermine, has lost 11 pounds by our scale, 15 pounds by home scale in the past 8 weeks. She is eating three times a day, not skipping meals, she has more energy and has had decreased back and joint pain. She has not started exercising yet. But has [employer] She has multiple food allergies that cause IBS like symptoms and ? Anaphylaxis, has seen GI and allergist in the past. She notices symptoms with cow milk on and off and wanted to be tested for this     Review Of Systems:  GEN- denies fatigue, fever, weight loss,weakness, recent illness HEENT- denies eye drainage, change in vision, nasal discharge, CVS- denies chest pain, palpitations RESP- denies SOB, cough, wheeze ABD- denies N/V, change in stools, abd pain GU- denies dysuria, hematuria, dribbling, incontinence MSK- denies joint pain, muscle aches, injury Neuro- denies headache, dizziness, syncope, seizure activity       Objective:    BP 126/68 mmHg  Pulse 72  Temp(Src) 98.2 F (36.8 C) (Oral)  Resp 14  Ht  (1.702 m)  Wt 196 lb (88.905 kg)  BMI 30.69 kg/m2 GEN- NAD, alert and oriented x3 Neck- Supple, no thyromegaly CVS- RRR, no murmur RESP-CTAB EXT- No edema Pulses- Radial - 2+        Assessment & Plan:      Problem List Items Addressed This Visit    None      Note: This dictation was prepared with Dragon dictation along with smaller phrase technology. Any transcriptional errors that result from this process are unintentional.

## 2015-03-25 ENCOUNTER — Encounter: Payer: Self-pay | Admitting: Family Medicine

## 2015-04-07 ENCOUNTER — Telehealth: Payer: Self-pay | Admitting: Family Medicine

## 2015-04-07 DIAGNOSIS — Z308 Encounter for other contraceptive management: Secondary | ICD-10-CM

## 2015-04-07 MED ORDER — NORETHIN-ETH ESTRAD-FE BIPHAS 1 MG-10 MCG / 10 MCG PO TABS: ORAL_TABLET | ORAL | Status: DC

## 2015-04-07 NOTE — Telephone Encounter (Signed)
Call placed to pharmacy.   Was advised that prescription reads q 28 days and insurance will not pay for it early. States that new prescription can be sent that states 21 days continuously with (4) 28 day cycles per year.   Prescription sent to pharmacy.

## 2015-04-07 NOTE — Telephone Encounter (Signed)
[facility] and they wouldn't let her pick it up due to it being too early. Please advise [phone]

## 2015-05-06 ENCOUNTER — Ambulatory Visit (INDEPENDENT_AMBULATORY_CARE_PROVIDER_SITE_OTHER): Payer: 59 | Admitting: Physician Assistant

## 2015-05-06 ENCOUNTER — Encounter: Payer: Self-pay | Admitting: Physician Assistant

## 2015-05-06 VITALS — BP 122/72 | HR 88 | Temp 98.4°F | Resp 18 | Wt 193.0 lb

## 2015-05-06 DIAGNOSIS — H9202 Otalgia, left ear: Secondary | ICD-10-CM | POA: Diagnosis not present

## 2015-05-06 DIAGNOSIS — J309 Allergic rhinitis, unspecified: Secondary | ICD-10-CM

## 2015-05-06 NOTE — Progress Notes (Signed)
    Patient ID: Dominique BelfastVictoria G Davis MRN: [id number], DOB: August 23, 1985, 29 y.o. Date of Encounter: 05/06/2015, 9:31 AM    Chief Complaint:  Chief Complaint  Patient presents with  . reoccuring prob with ears    pain around eyes  ? sinus     HPI: 29 y.o. year old white female states that she has not had this problem evaluated or treated prior to today. Says that at times her left ear feels like it needs to pop. Is that during that time she cannot hear other people are saying. Says that she's also had a little pressure around her eyes. Has allergies and has been having sneezing but the drainage coming out is just clear. She has used no decongestants or allergy medicines.     Home Meds:   Outpatient Prescriptions Prior to Visit  Medication Sig Dispense Refill  . EPIPEN 2-PAK 0.3 MG/0.3ML SOAJ injection Inject 0.3 mg into the muscle as needed (allergic reaction).     . lactobacillus acidophilus (BACID) TABS tablet Take 2 tablets by mouth 3 (three) times daily.    . methocarbamol (ROBAXIN) 500 MG tablet Take 1 tablet (500 mg total) by mouth every 8 (eight) hours as needed for muscle spasms. 60 tablet 2  . Norethindrone-Ethinyl Estradiol-Fe Biphas (LO LOESTRIN FE) 1 MG-10 MCG / 10 MCG tablet 1 tab PO QD x21 days continuously. 1 Package 11  . phentermine (ADIPEX-P) 37.5 MG tablet Take 1 tablet (37.5 mg total) by mouth daily before breakfast. 30 tablet 2   No facility-administered medications prior to visit.    Allergies: No Known Allergies    Review of Systems: See HPI for pertinent ROS. All other ROS negative.    Physical Exam: Blood pressure 122/72, pulse 88, temperature 98.4 F (36.9 C), temperature source Oral, resp. rate 18, weight 193 lb (87.544 kg)., Body mass index is 30.22 kg/(m^2). General:  Overweight white female. Appears in no acute distress. HEENT: Normocephalic, atraumatic, eyes without discharge, sclera non-icteric, nares are without discharge. Bilateral auditory canals  clear, TM's are without perforation, pearly grey and translucent with reflective cone of light bilaterally. Oral cavity moist, posterior pharynx without exudate, erythema, peritonsillar abscess. No tenderness with percussion of frontal or maxillary sinuses bilaterally.  Neck: Supple. No thyromegaly. No lymphadenopathy. Lungs: Clear bilaterally to auscultation without wheezes, rales, or rhonchi. Breathing is unlabored. Heart: Regular rhythm. No murmurs, rubs, or gallops. Msk:  Strength and tone normal for age. Extremities/Skin: Warm and dry. Neuro: Alert and oriented X 3. Moves all extremities spontaneously. Gait is normal. CNII-XII grossly in tact. Psych:  Responds to questions appropriately with a normal affect.     ASSESSMENT AND PLAN:  29 y.o. year old female with  1. Allergic rhinitis, unspecified allergic rhinitis type She is to take over-the-counter Zyrtec D daily. If these symptoms do not improve in 1 week and follow-up with us.  2. Otalgia of left ear She is to take over-the-counter Zyrtec D daily. If these symptoms do not improve in 1 week and follow-up with us.    Signed, [address]Mary Beth West [place], [place], [facility] 05/06/2015 9:31 AM

## 2015-09-14 ENCOUNTER — Encounter: Payer: Self-pay | Admitting: Family Medicine

## 2015-10-05 ENCOUNTER — Ambulatory Visit (INDEPENDENT_AMBULATORY_CARE_PROVIDER_SITE_OTHER): Payer: 59 | Admitting: Family Medicine

## 2015-10-05 ENCOUNTER — Encounter: Payer: Self-pay | Admitting: Family Medicine

## 2015-10-05 VITALS — BP 122/64 | HR 72 | Temp 98.7°F | Resp 14 | Ht 67.0 in | Wt 188.0 lb

## 2015-10-05 DIAGNOSIS — F329 Major depressive disorder, single episode, unspecified: Secondary | ICD-10-CM | POA: Insufficient documentation

## 2015-10-05 DIAGNOSIS — F418 Other specified anxiety disorders: Secondary | ICD-10-CM

## 2015-10-05 DIAGNOSIS — N898 Other specified noninflammatory disorders of vagina: Secondary | ICD-10-CM | POA: Diagnosis not present

## 2015-10-05 DIAGNOSIS — G4700 Insomnia, unspecified: Secondary | ICD-10-CM

## 2015-10-05 DIAGNOSIS — Z124 Encounter for screening for malignant neoplasm of cervix: Secondary | ICD-10-CM

## 2015-10-05 DIAGNOSIS — Z0000 Encounter for general adult medical examination without abnormal findings: Secondary | ICD-10-CM | POA: Diagnosis not present

## 2015-10-05 DIAGNOSIS — F5102 Adjustment insomnia: Secondary | ICD-10-CM | POA: Insufficient documentation

## 2015-10-05 DIAGNOSIS — G8929 Other chronic pain: Secondary | ICD-10-CM | POA: Diagnosis not present

## 2015-10-05 DIAGNOSIS — Z308 Encounter for other contraceptive management: Secondary | ICD-10-CM

## 2015-10-05 DIAGNOSIS — M549 Dorsalgia, unspecified: Secondary | ICD-10-CM

## 2015-10-05 HISTORY — DX: Major depressive disorder, single episode, unspecified: F32.9

## 2015-10-05 LAB — WET PREP FOR TRICH, YEAST, CLUE
CLUE CELLS WET PREP: NONE SEEN
TRICH WET PREP: NONE SEEN
Yeast Wet Prep HPF POC: NONE SEEN

## 2015-10-05 MED ORDER — NORETHIN-ETH ESTRAD-FE BIPHAS 1 MG-10 MCG / 10 MCG PO TABS: ORAL_TABLET | ORAL | Status: DC

## 2015-10-05 MED ORDER — TRAZODONE HCL 50 MG PO TABS: 25.0000 mg | ORAL_TABLET | Freq: Every evening | ORAL | Status: DC | PRN

## 2015-10-05 MED ORDER — EPINEPHRINE 0.3 MG/0.3ML IJ SOAJ: 0.3000 mg | Freq: Once | INTRAMUSCULAR | Status: DC

## 2015-10-05 MED ORDER — METHOCARBAMOL 500 MG PO TABS: 500.0000 mg | ORAL_TABLET | Freq: Three times a day (TID) | ORAL | Status: DC | PRN

## 2015-10-05 NOTE — Assessment & Plan Note (Signed)
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

## 2015-10-05 NOTE — Assessment & Plan Note (Signed)
I will try her on trazodone she has concurrent insomnia. She will take 25 mg at bedtime and move up to 50 mg.

## 2015-10-05 NOTE — Patient Instructions (Addendum)
I recommend eye visit once a year I recommend dental visit every 6 months Goal is to  Exercise 30 minutes 5 days a week We will send a letter with lab results  Try 1/2 tablet of trazodone first, then move to 1 tablet if needed for sleep F/U 3 MONTHS for medications

## 2015-10-05 NOTE — Progress Notes (Signed)
Patient ID: Dominique BelfastVictoria G Schiraldi, female   DOB: 1985-06-13, 30 y.o.   MRN: [id number]   Subjective:    Patient ID: Dominique Davis, female    DOB: 1985-06-13, 30 y.o.   MRN: [id number]  Patient presents for CPE  here for complete physical exam. Normal Pap smear in 2015 Tetanus booster up-to-date She will have fasting labs done at her job for free.  She has had some vaginal discharge with a mild odor. She is sexually active with one partner.  She's been under a lot of stress recently her boyfriend whom she lives with has lost his job she is the only one bringing in an income. His mother also has had some failing health and she is also helping to support the mother. She is planning to go back to school in the fall at [employer] to study biology and she stressed about the cost of school. She is not sleeping very well she is on third shift and her sleep is been terrible since she's been on third shift. She is taking multiple over-the-counter medications but this does not help. She just feels very overwhelmed and anxious. She states she also has a history of depression at times feels herself very tearful as well. She does have significant family history of mental illness.  Review Of Systems:  GEN- denies fatigue, fever, weight loss,weakness, recent illness HEENT- denies eye drainage, change in vision, nasal discharge, CVS- denies chest pain, palpitations RESP- denies SOB, cough, wheeze ABD- denies N/V, change in stools, abd pain GU- denies dysuria, hematuria, dribbling, incontinence MSK- denies joint pain, muscle aches, injury Neuro- denies headache, dizziness, syncope, seizure activity       Objective:    BP 122/64 mmHg  Pulse 72  Temp(Src) 98.7 F (37.1 C) (Oral)  Resp 14  Ht 5\' 7"  (1.702 m)  Wt 188 lb (85.276 kg)  BMI 29.44 kg/m2 GEN- NAD, alert and oriented x3 HEENT- PERRL, EOMI, non injected sclera, pink conjunctiva, MMM, oropharynx clear Neck- Supple, no thyromegaly CVS- RRR, no  murmur RESP-CTAB ABD-NABS,soft,NT,ND GU- normal external genitalia, vaginal mucosa pink and moist, cervix visualized no growth, no blood form os, + discharge, no CMT, no ovarian masses, uterus normal size Psych- normal affect and mood, no SI,  EXT- No edema Pulses- Radial, DP- 2+        Assessment & Plan:      Problem List Items Addressed This Visit    Insomnia   Depression with anxiety    I will try her on trazodone she has concurrent insomnia. She will take 25 mg at bedtime and move up to 50 mg.      Chronic back pain    Letter written to allow her to sit down and her lab bench as needed      Relevant Medications   methocarbamol (ROBAXIN) 500 MG tablet    Other Visit Diagnoses    Routine general medical examination at a health care facility    -  Primary    CPE done, continue OCP, no menses in years    Relevant Orders    CBC with Differential/Platelet    Comprehensive metabolic panel    Lipid panel    TSH    Vaginal discharge        Cultures taken    Relevant Orders    WET PREP FOR TRICH, YEAST, CLUE (Completed)    GC/Chlamydia Probe Amp    Cervical cancer screening  Relevant Orders    PAP, ThinPrep ASCUS Rflx HPV Rflx Type    Encounter for other contraceptive management        Relevant Medications    Norethindrone-Ethinyl Estradiol-Fe Biphas (LO LOESTRIN FE) 1 MG-10 MCG / 10 MCG tablet       Note: This dictation was prepared with Dragon dictation along with smaller phrase technology. Any transcriptional errors that result from this process are unintentional.

## 2015-10-06 LAB — GC/CHLAMYDIA PROBE AMP
CT Probe RNA: NOT DETECTED
GC Probe RNA: NOT DETECTED

## 2015-10-06 LAB — PAP THINPREP ASCUS RFLX HPV RFLX TYPE

## 2015-10-07 ENCOUNTER — Other Ambulatory Visit: Payer: Self-pay | Admitting: *Deleted

## 2015-10-07 MED ORDER — FLUCONAZOLE 150 MG PO TABS: ORAL_TABLET | ORAL | Status: DC

## 2015-10-08 ENCOUNTER — Other Ambulatory Visit: Payer: Self-pay | Admitting: Family Medicine

## 2015-10-09 LAB — COMPREHENSIVE METABOLIC PANEL
ALK PHOS: 30 IU/L — AB (ref 39–117)
ALT: 11 IU/L (ref 0–32)
AST: 15 IU/L (ref 0–40)
Albumin/Globulin Ratio: 1.8 (ref 1.2–2.2)
Albumin: 4.2 g/dL (ref 3.5–5.5)
BILIRUBIN TOTAL: 0.2 mg/dL (ref 0.0–1.2)
BUN/Creatinine Ratio: 18 (ref 9–23)
BUN: 16 mg/dL (ref 6–20)
CHLORIDE: 100 mmol/L (ref 96–106)
CO2: 23 mmol/L (ref 18–29)
Calcium: 8.8 mg/dL (ref 8.7–10.2)
Creatinine, Ser: 0.88 mg/dL (ref 0.57–1.00)
GFR calc Af Amer: 103 mL/min/{1.73_m2} (ref >59)
GFR, EST NON AFRICAN AMERICAN: 90 mL/min/{1.73_m2} (ref >59)
GLUCOSE: 81 mg/dL (ref 65–99)
Globulin, Total: 2.3 g/dL (ref 1.5–4.5)
POTASSIUM: 4.1 mmol/L (ref 3.5–5.2)
SODIUM: 139 mmol/L (ref 134–144)
Total Protein: 6.5 g/dL (ref 6.0–8.5)

## 2015-10-09 LAB — LIPID PANEL W/O CHOL/HDL RATIO
CHOLESTEROL TOTAL: 159 mg/dL (ref 100–199)
HDL: 55 mg/dL (ref >39)
LDL Calculated: 88 mg/dL (ref 0–99)
TRIGLYCERIDES: 79 mg/dL (ref 0–149)
VLDL Cholesterol Cal: 16 mg/dL (ref 5–40)

## 2015-10-09 LAB — CBC WITH DIFFERENTIAL/PLATELET
BASOS ABS: 0 10*3/uL (ref 0.0–0.2)
Basos: 0 %
EOS (ABSOLUTE): 0 10*3/uL (ref 0.0–0.4)
Eos: 1 %
HEMOGLOBIN: 13.1 g/dL (ref 11.1–15.9)
Hematocrit: 38.1 % (ref 34.0–46.6)
IMMATURE GRANS (ABS): 0 10*3/uL (ref 0.0–0.1)
Immature Granulocytes: 0 %
LYMPHS ABS: 2.3 10*3/uL (ref 0.7–3.1)
Lymphs: 34 %
MCH: 29 pg (ref 26.6–33.0)
MCHC: 34.4 g/dL (ref 31.5–35.7)
MCV: 85 fL (ref 79–97)
MONOCYTES: 7 %
Monocytes Absolute: 0.5 10*3/uL (ref 0.1–0.9)
NEUTROS ABS: 3.8 10*3/uL (ref 1.4–7.0)
Neutrophils: 58 %
PLATELETS: 224 10*3/uL (ref 150–379)
RBC: 4.51 x10E6/uL (ref 3.77–5.28)
RDW: 13 % (ref 12.3–15.4)
WBC: 6.6 10*3/uL (ref 3.4–10.8)

## 2015-10-09 LAB — TSH: TSH: 4.15 u[IU]/mL (ref 0.450–4.500)

## 2015-10-13 ENCOUNTER — Encounter: Payer: Self-pay | Admitting: *Deleted

## 2015-10-22 ENCOUNTER — Telehealth: Payer: Self-pay | Admitting: *Deleted

## 2015-10-22 NOTE — Telephone Encounter (Signed)
Patient returned call.   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   Forms routed to provider.

## 2015-10-22 NOTE — Telephone Encounter (Signed)
Received fax from [facility] for FMLA forms.   Call placed to patient for more information.   LMTRC.

## 2015-10-23 NOTE — Telephone Encounter (Signed)
Please inquire to why she is needing FMLA for this ? We havent taken her out for any issues with her knees or backs, I just provided letter so that she could sit while at work

## 2015-10-23 NOTE — Telephone Encounter (Signed)
Spoke with patient.   States that she has not had to miss any work at this time, but she would like to have it on file if she should ever need to be out.

## 2015-10-23 NOTE — Telephone Encounter (Signed)
Call pt I can not use the knee pain as no chronic diagnonis even from ortho notes that qualify  For her back she will can be given 1-2 days every 3 months, with episodes lasting 2 days

## 2015-10-28 NOTE — Telephone Encounter (Signed)
Received completed FMLA from provider.   Simple form fee to be charged per provider. Patient contacted to pay fee.  

## 2015-12-02 ENCOUNTER — Telehealth: Payer: Self-pay | Admitting: Family Medicine

## 2015-12-02 NOTE — Telephone Encounter (Signed)
Call placed to patient.    Reports that she has x3 days of intermittent burning in lower thigh with slight bruising at site. Reports that nurse at work looked at area and states that it looks like a varicose vein.   Requested advise on how to treat. Advised to use compression socks while working at night. If further treatment is required, OV will be needed.

## 2015-12-02 NOTE — Telephone Encounter (Signed)
Patient calling to say that she has a varicose vein that seems hot, would like a call back regarding this  [phone] (H)

## 2015-12-22 ENCOUNTER — Telehealth: Payer: Self-pay | Admitting: *Deleted

## 2015-12-22 DIAGNOSIS — Z0000 Encounter for general adult medical examination without abnormal findings: Secondary | ICD-10-CM

## 2015-12-22 NOTE — Telephone Encounter (Signed)
Order placed.   Call placed to patient and message left on VM to make aware.

## 2015-12-22 NOTE — Telephone Encounter (Signed)
Received call from patient.   Reports that A1C is required for Wellness Form that was completed by MD.   Requested order for A1C ([facility]).   Advised that patient fasting blood glucose levels are WNL, so A1C was not run per protocol. Reports that [facility] must [facility].   MD please advise.

## 2015-12-22 NOTE — Telephone Encounter (Signed)
Okay to place order for A1C but if it is not covered we do not have a medical diagnosis to cover it , she may have to pay out of pocket

## 2015-12-23 LAB — HEMOGLOBIN A1C
ESTIMATED AVERAGE GLUCOSE: 105 mg/dL
Hgb A1c MFr Bld: 5.3 % (ref 4.8–5.6)

## 2016-02-12 ENCOUNTER — Telehealth: Payer: Self-pay | Admitting: *Deleted

## 2016-02-12 DIAGNOSIS — Z3009 Encounter for other general counseling and advice on contraception: Secondary | ICD-10-CM

## 2016-02-12 DIAGNOSIS — Z30430 Encounter for insertion of intrauterine contraceptive device: Secondary | ICD-10-CM

## 2016-02-12 MED ORDER — NORGESTIM-ETH ESTRAD TRIPHASIC 0.18/0.215/0.25 MG-35 MCG PO TABS: 1.0000 | ORAL_TABLET | Freq: Every day | ORAL | 11 refills | Status: DC

## 2016-02-12 NOTE — Telephone Encounter (Signed)
Received call from patient.   Requested referral for GYN to place IUD>   Referral orders entered.

## 2016-02-24 ENCOUNTER — Ambulatory Visit (INDEPENDENT_AMBULATORY_CARE_PROVIDER_SITE_OTHER): Payer: 59 | Admitting: Women's Health

## 2016-02-24 ENCOUNTER — Encounter: Payer: Self-pay | Admitting: Women's Health

## 2016-02-24 VITALS — BP 118/72 | HR 84 | Ht 67.0 in | Wt 202.0 lb

## 2016-02-24 DIAGNOSIS — Z30014 Encounter for initial prescription of intrauterine contraceptive device: Secondary | ICD-10-CM

## 2016-02-24 DIAGNOSIS — Z3009 Encounter for other general counseling and advice on contraception: Secondary | ICD-10-CM

## 2016-02-24 MED ORDER — MISOPROSTOL 200 MCG PO TABS: 400.0000 ug | ORAL_TABLET | Freq: Once | ORAL | 0 refills | Status: DC

## 2016-02-24 NOTE — Progress Notes (Signed)
   [facility] Visit  Patient name: Dominique Davis MRN [id number]  Date of birth: [birth year]  CC & HPI:  Dominique Davis is a 30 y.o. G0P0000 Caucasian female presenting today to discuss getting an IUD. Currently on coc's and not having period, but doesn't like having to remember taking pill daily. Decreased libido, lots of stress, working 3rd shift, going to school, tired and just no desire. Last sex 1 week ago. Periods are 'horrible' w/o birth control, heavy, passes out, terrible cramps. Discussed Kyleena vs. Mirena, may do better w/ Mirena.  No LMP recorded. Patient is not currently having periods (Reason: Oral contraceptives). The current method of family planning is OCP (estrogen/progesterone). Last pap May 2017, normal   Pertinent History Reviewed:  Medical & Surgical Hx:   Past medical, surgical, family, and social history reviewed in electronic medical record Medications: Reviewed & Updated - see associated section Allergies: Reviewed in electronic medical record  Objective Findings:  Vitals: BP 118/72   Pulse 84   Ht 5\' 7"  (1.702 m)   Wt 202 lb (91.6 kg)   BMI 31.64 kg/m  Body mass index is 31.64 kg/m.  Physical Examination: General appearance - alert, well appearing, and in no distress  No results found for this or any previous visit (from the past 24 hour(s)).   Assessment & Plan:  A:   Contraception counseling  P:  Abstinence until IUD placed  Rx cytotec 400mcg po 2-3hrs prior to IUD insertion, call first to make sure bhcg neg  Return in about 6 days (around 03/01/2016) for stat bhcg am, then iud insertion pm.  Marge DuncansBooker, Corry Storie Randall CNM, [facility] 02/24/2016 12:52 PM

## 2016-02-24 NOTE — Patient Instructions (Addendum)
NO SEX UNTIL AFTER YOU GET YOUR BIRTH CONTROL   Call [place] before you take the misoprostol (cytotec) to make sure pregnancy test neg  Levonorgestrel intrauterine device (IUD) What is this medicine? LEVONORGESTREL IUD (LEE voe nor jes trel) is a contraceptive (birth control) device. The device is placed inside the uterus by a healthcare professional. It is used to prevent pregnancy and can also be used to treat heavy bleeding that occurs during your period. Depending on the device, it can be used for 3 to 5 years. This medicine may be used for other purposes; ask your health care provider or pharmacist if you have questions. What should I tell my health care provider before I take this medicine? They need to know if you have any of these conditions: -abnormal Pap smear -cancer of the breast, uterus, or cervix -diabetes -endometritis -genital or pelvic infection now or in the past -have more than one sexual partner or your partner has more than one partner -heart disease -history of an ectopic or tubal pregnancy -immune system problems -IUD in place -liver disease or tumor -problems with blood clots or take blood-thinners -use intravenous drugs -uterus of unusual shape -vaginal bleeding that has not been explained -an unusual or allergic reaction to levonorgestrel, other hormones, silicone, or polyethylene, medicines, foods, dyes, or preservatives -pregnant or trying to get pregnant -breast-feeding How should I use this medicine? This device is placed inside the uterus by a health care professional. Talk to your pediatrician regarding the use of this medicine in children. Special care may be needed. Overdosage: If you think you have taken too much of this medicine contact a poison control center or emergency room at once. NOTE: This medicine is only for you. Do not share this medicine with others. What if I miss a dose? This does not apply. What may interact with this medicine? Do not  take this medicine with any of the following medications: -amprenavir -bosentan -fosamprenavir This medicine may also interact with the following medications: -aprepitant -barbiturate medicines for inducing sleep or treating seizures -bexarotene -griseofulvin -medicines to treat seizures like carbamazepine, ethotoin, felbamate, oxcarbazepine, phenytoin, topiramate -modafinil -pioglitazone -rifabutin -rifampin -rifapentine -some medicines to treat HIV infection like atazanavir, indinavir, lopinavir, nelfinavir, tipranavir, ritonavir -St. John's wort -warfarin This list may not describe all possible interactions. Give your health care provider a list of all the medicines, herbs, non-prescription drugs, or dietary supplements you use. Also tell them if you smoke, drink alcohol, or use illegal drugs. Some items may interact with your medicine. What should I watch for while using this medicine? Visit your doctor or health care professional for regular check ups. See your doctor if you or your partner has sexual contact with others, becomes HIV positive, or gets a sexual transmitted disease. This product does not protect you against HIV infection (AIDS) or other sexually transmitted diseases. You can check the placement of the IUD yourself by reaching up to the top of your vagina with clean fingers to feel the threads. Do not pull on the threads. It is a good habit to check placement after each menstrual period. Call your doctor right away if you feel more of the IUD than just the threads or if you cannot feel the threads at all. The IUD may come out by itself. You may become pregnant if the device comes out. If you notice that the IUD has come out use a backup birth control method like condoms and call your health care provider. Using tampons  will not change the position of the IUD and are okay to use during your period. What side effects may I notice from receiving this medicine? Side effects  that you should report to your doctor or health care professional as soon as possible: -allergic reactions like skin rash, itching or hives, swelling of the face, lips, or tongue -fever, flu-like symptoms -genital sores -high blood pressure -no menstrual period for 6 weeks during use -pain, swelling, warmth in the leg -pelvic pain or tenderness -severe or sudden headache -signs of pregnancy -stomach cramping -sudden shortness of breath -trouble with balance, talking, or walking -unusual vaginal bleeding, discharge -yellowing of the eyes or skin Side effects that usually do not require medical attention (report to your doctor or health care professional if they continue or are bothersome): -acne -breast pain -change in sex drive or performance -changes in weight -cramping, dizziness, or faintness while the device is being inserted -headache -irregular menstrual bleeding within first 3 to 6 months of use -nausea This list may not describe all possible side effects. Call your doctor for medical advice about side effects. You may report side effects to FDA at 1-800-FDA-1088. Where should I keep my medicine? This does not apply. NOTE: This sheet is a summary. It may not cover all possible information. If you have questions about this medicine, talk to your doctor, pharmacist, or health care provider.    2016, Elsevier/Gold Standard. (2011-06-23 13:54:04)

## 2016-03-01 ENCOUNTER — Encounter: Payer: Self-pay | Admitting: Women's Health

## 2016-03-01 ENCOUNTER — Ambulatory Visit (INDEPENDENT_AMBULATORY_CARE_PROVIDER_SITE_OTHER): Payer: 59 | Admitting: Women's Health

## 2016-03-01 ENCOUNTER — Other Ambulatory Visit: Payer: 59

## 2016-03-01 VITALS — BP 126/70 | HR 76 | Wt 204.0 lb

## 2016-03-01 DIAGNOSIS — Z30014 Encounter for initial prescription of intrauterine contraceptive device: Secondary | ICD-10-CM

## 2016-03-01 DIAGNOSIS — Z30430 Encounter for insertion of intrauterine contraceptive device: Secondary | ICD-10-CM | POA: Diagnosis not present

## 2016-03-01 DIAGNOSIS — Z3049 Encounter for surveillance of other contraceptives: Secondary | ICD-10-CM

## 2016-03-01 DIAGNOSIS — Z975 Presence of (intrauterine) contraceptive device: Secondary | ICD-10-CM

## 2016-03-01 HISTORY — DX: Encounter for insertion of intrauterine contraceptive device: Z30.430

## 2016-03-01 LAB — BETA HCG QUANT (REF LAB)

## 2016-03-01 NOTE — Progress Notes (Signed)
Dominique BelfastVictoria G Davis is a 30 y.o. year old 730P0000 Caucasian female who presents for placement of a Mirena IUD.  No LMP recorded. Patient is not currently having periods (Reason: Oral contraceptives). BP 126/70 (BP Location: Right Arm, Patient Position: Sitting, Cuff Size: Normal)   Pulse 76   Wt 204 lb (92.5 kg)   BMI 31.95 kg/m  Last sexual intercourse was >10d ago, and bhcg this am was neg  The risks and benefits of the method and placement have been thouroughly reviewed with the patient and all questions were answered.  Specifically the patient is aware of failure rate of 06/998, expulsion of the IUD and of possible perforation.  The patient is aware of irregular bleeding due to the method and understands the incidence of irregular bleeding diminishes with time.  Signed copy of informed consent in chart.   Time out was performed.  A graves speculum was placed in the vagina.  The cervix was visualized, prepped using Betadine, and grasped with a single tooth tenaculum. The uterus was found to be neutral and it sounded to 7 cm.  Mirena IUD placed per manufacturer's recommendations.   The strings were trimmed to 3 cm.  Sonogram was performed and the proper placement of the IUD was verified via transvaginal u/s by myself and amber, ultrasonographer.   The patient was given post procedure instructions, including signs and symptoms of infection and to check for the strings after each menses or each month, and refraining from intercourse or anything in the vagina for 3 days.  She was given a Mirena care card with date IUD placed, and date IUD to be removed.  She is scheduled for a f/u appointment in 4 weeks.  Marge DuncansBooker, Jaycion Treml Randall CNM, [facility] 03/01/2016 5:03 PM

## 2016-03-01 NOTE — Patient Instructions (Signed)
 Nothing in vagina for 3 days (no sex, douching, tampons, etc...)  Check your strings once a month to make sure you can feel them, if you are not able to please let us know  If you develop a fever of 100.4 or more in the next few weeks, or if you develop severe abdominal pain, please let us know  Use a backup method of birth control, such as condoms, for 2 weeks    Intrauterine Device Insertion, Care After Refer to this sheet in the next few weeks. These instructions provide you with information on caring for yourself after your procedure. Your health care provider may also give you more specific instructions. Your treatment has been planned according to current medical practices, but problems sometimes occur. Call your health care provider if you have any problems or questions after your procedure. WHAT TO EXPECT AFTER THE PROCEDURE Insertion of the IUD may cause some discomfort, such as cramping. The cramping should improve after the IUD is in place. You may have bleeding after the procedure. This is normal. It varies from light spotting for a few days to menstrual-like bleeding. When the IUD is in place, a string will extend past the cervix into the vagina for 1-2 inches. The strings should not bother you or your partner. If they do, talk to your health care provider.  HOME CARE INSTRUCTIONS   Check your intrauterine device (IUD) to make sure it is in place before you resume sexual activity. You should be able to feel the strings. If you cannot feel the strings, something may be wrong. The IUD may have fallen out of the uterus, or the uterus may have been punctured (perforated) during placement. Also, if the strings are getting longer, it may mean that the IUD is being forced out of the uterus. You no longer have full protection from pregnancy if any of these problems occur.  You may resume sexual intercourse if you are not having problems with the IUD. The copper IUD is considered immediately  effective, and the hormone IUD works right away if inserted within 7 days of your period starting. You will need to use a backup method of birth control for 7 days if the IUD in inserted at any other time in your cycle.  Continue to check that the IUD is still in place by feeling for the strings after every menstrual period.  You may need to take pain medicine such as acetaminophen or ibuprofen. Only take medicines as directed by your health care provider. SEEK MEDICAL CARE IF:   You have bleeding that is heavier or lasts longer than a normal menstrual cycle.  You have a fever.  You have increasing cramps or abdominal pain not relieved with medicine.  You have abdominal pain that does not seem to be related to the same area of earlier cramping and pain.  You are lightheaded, unusually weak, or faint.  You have abnormal vaginal discharge or smells.  You have pain during sexual intercourse.  You cannot feel the IUD strings, or the IUD string has gotten longer.  You feel the IUD at the opening of the cervix in the vagina.  You think you are pregnant, or you miss your menstrual period.  The IUD string is hurting your sex partner. MAKE SURE YOU:  Understand these instructions.  Will watch your condition.  Will get help right away if you are not doing well or get worse.   This information is not intended to replace   advice given to you by your health care provider. Make sure you discuss any questions you have with your health care provider.   Document Released: 01/19/2011 Document Revised: 03/13/2013 Document Reviewed: 11/11/2012 Elsevier Interactive Patient Education 2016 Elsevier Inc.  Levonorgestrel intrauterine device (IUD) What is this medicine? LEVONORGESTREL IUD (LEE voe nor jes trel) is a contraceptive (birth control) device. The device is placed inside the uterus by a healthcare professional. It is used to prevent pregnancy and can also be used to treat heavy bleeding  that occurs during your period. Depending on the device, it can be used for 3 to 5 years. This medicine may be used for other purposes; ask your health care provider or pharmacist if you have questions. What should I tell my health care provider before I take this medicine? They need to know if you have any of these conditions: -abnormal Pap smear -cancer of the breast, uterus, or cervix -diabetes -endometritis -genital or pelvic infection now or in the past -have more than one sexual partner or your partner has more than one partner -heart disease -history of an ectopic or tubal pregnancy -immune system problems -IUD in place -liver disease or tumor -problems with blood clots or take blood-thinners -use intravenous drugs -uterus of unusual shape -vaginal bleeding that has not been explained -an unusual or allergic reaction to levonorgestrel, other hormones, silicone, or polyethylene, medicines, foods, dyes, or preservatives -pregnant or trying to get pregnant -breast-feeding How should I use this medicine? This device is placed inside the uterus by a health care professional. Talk to your pediatrician regarding the use of this medicine in children. Special care may be needed. Overdosage: If you think you have taken too much of this medicine contact a poison control center or emergency room at once. NOTE: This medicine is only for you. Do not share this medicine with others. What if I miss a dose? This does not apply. What may interact with this medicine? Do not take this medicine with any of the following medications: -amprenavir -bosentan -fosamprenavir This medicine may also interact with the following medications: -aprepitant -barbiturate medicines for inducing sleep or treating seizures -bexarotene -griseofulvin -medicines to treat seizures like carbamazepine, ethotoin, felbamate, oxcarbazepine, phenytoin,  topiramate -modafinil -pioglitazone -rifabutin -rifampin -rifapentine -some medicines to treat HIV infection like atazanavir, indinavir, lopinavir, nelfinavir, tipranavir, ritonavir -St. John's wort -warfarin This list may not describe all possible interactions. Give your health care provider a list of all the medicines, herbs, non-prescription drugs, or dietary supplements you use. Also tell them if you smoke, drink alcohol, or use illegal drugs. Some items may interact with your medicine. What should I watch for while using this medicine? Visit your doctor or health care professional for regular check ups. See your doctor if you or your partner has sexual contact with others, becomes HIV positive, or gets a sexual transmitted disease. This product does not protect you against HIV infection (AIDS) or other sexually transmitted diseases. You can check the placement of the IUD yourself by reaching up to the top of your vagina with clean fingers to feel the threads. Do not pull on the threads. It is a good habit to check placement after each menstrual period. Call your doctor right away if you feel more of the IUD than just the threads or if you cannot feel the threads at all. The IUD may come out by itself. You may become pregnant if the device comes out. If you notice that the IUD has come out   use a backup birth control method like condoms and call your health care provider. Using tampons will not change the position of the IUD and are okay to use during your period. What side effects may I notice from receiving this medicine? Side effects that you should report to your doctor or health care professional as soon as possible: -allergic reactions like skin rash, itching or hives, swelling of the face, lips, or tongue -fever, flu-like symptoms -genital sores -high blood pressure -no menstrual period for 6 weeks during use -pain, swelling, warmth in the leg -pelvic pain or tenderness -severe or  sudden headache -signs of pregnancy -stomach cramping -sudden shortness of breath -trouble with balance, talking, or walking -unusual vaginal bleeding, discharge -yellowing of the eyes or skin Side effects that usually do not require medical attention (report to your doctor or health care professional if they continue or are bothersome): -acne -breast pain -change in sex drive or performance -changes in weight -cramping, dizziness, or faintness while the device is being inserted -headache -irregular menstrual bleeding within first 3 to 6 months of use -nausea This list may not describe all possible side effects. Call your doctor for medical advice about side effects. You may report side effects to FDA at 1-800-FDA-1088. Where should I keep my medicine? This does not apply. NOTE: This sheet is a summary. It may not cover all possible information. If you have questions about this medicine, talk to your doctor, pharmacist, or health care provider.    2016, Elsevier/Gold Standard. (2011-06-23 13:54:04)   

## 2016-03-31 ENCOUNTER — Ambulatory Visit (INDEPENDENT_AMBULATORY_CARE_PROVIDER_SITE_OTHER): Payer: 59 | Admitting: Women's Health

## 2016-03-31 ENCOUNTER — Encounter: Payer: Self-pay | Admitting: Women's Health

## 2016-03-31 ENCOUNTER — Telehealth: Payer: Self-pay | Admitting: *Deleted

## 2016-03-31 VITALS — BP 112/64 | HR 84 | Wt 202.0 lb

## 2016-03-31 DIAGNOSIS — Z30431 Encounter for routine checking of intrauterine contraceptive device: Secondary | ICD-10-CM | POA: Diagnosis not present

## 2016-03-31 NOTE — Progress Notes (Signed)
   [facility]  Patient name: Dominique Davis MRN [id number]  Date of birth: 1985/09/10  CC & HPI:  Dominique BelfastVictoria G Carapia is a 30 y.o. G0P0000 Caucasian female presenting today for IUD check. Mirena was placed 03/01/16. Tried to feel for strings, was uncertain of what she was feeling. Bled w/ cramps x 1 wk after placement, now both have stopped. No pain.  No LMP recorded. Patient is not currently having periods (Reason: Oral contraceptives). The current method of family planning is IUD. Last pap May 2017, normal  Pertinent History Reviewed:  Medical & Surgical Hx:   Past medical, surgical, family, and social history reviewed in electronic medical record Medications: Reviewed & Updated - see associated section Allergies: Reviewed in electronic medical record  Objective Findings:  Vitals: BP 112/64 (BP Location: Right Arm, Patient Position: Sitting, Cuff Size: Normal)   Pulse 84   Wt 202 lb (91.6 kg)   BMI 31.64 kg/m  Body mass index is 31.64 kg/m.  Physical Examination: General appearance - alert, well appearing, and in no distress Pelvic - IUD strings visible, tucked behind cx at 3 o'clock position, normal d/c  No results found for this or any previous visit (from the past 24 hour(s)).   Assessment & Plan:  A:   IUD check  P:  Check strings monthly, discussed how to do this  Return for May for physical.  Marge DuncansBooker, Kimberly Randall CNM, [facility] 03/31/2016 12:29 PM

## 2016-03-31 NOTE — Telephone Encounter (Signed)
Received call from patient.   Reports that she has had increase in activity and noted shin splints to BLE. Reports shin ans calf tenderness after activity and tightness to muscles.   States that she is working on her feet all night and is going to classes during the day. States that she has started a jogging class as well.   Advised that shin splint treatment is usually symptom management with protection, rest, ice, compression and elevation to reduce initial pain and inflammation.  Medication management include anti inflammatory medication such as Ibuprofen. Try to avoid running, jumping or any other activities which make symptoms worse. Calf raises and toe raise exercises help to stretch the muscles of the shin. Compression stockings are beneficial while standing for long periods of time.   Requested letter for her work stating that she may need to rest and elevate her legs through out her shift.   MD please advise.

## 2016-04-01 NOTE — Telephone Encounter (Signed)
She needs OV, I can not give her this over the phone and not something I typically give patients restrictions for.

## 2016-04-01 NOTE — Telephone Encounter (Signed)
Call placed to patient and patient made aware.   States that she is going to try the symptom management over the weekend and will call on Monday if she requires OV.

## 2016-04-26 ENCOUNTER — Encounter: Payer: Self-pay | Admitting: Family Medicine

## 2016-04-26 ENCOUNTER — Ambulatory Visit (INDEPENDENT_AMBULATORY_CARE_PROVIDER_SITE_OTHER): Payer: 59 | Admitting: Family Medicine

## 2016-04-26 VITALS — BP 122/64 | HR 84 | Temp 98.3°F | Resp 14 | Ht 67.0 in | Wt 202.0 lb

## 2016-04-26 DIAGNOSIS — F411 Generalized anxiety disorder: Secondary | ICD-10-CM | POA: Diagnosis not present

## 2016-04-26 MED ORDER — CLONAZEPAM 0.5 MG PO TABS: 0.5000 mg | ORAL_TABLET | Freq: Two times a day (BID) | ORAL | 1 refills | Status: DC | PRN

## 2016-04-26 NOTE — Patient Instructions (Addendum)
Try the klonopin as needed during attacks  Call for any concerns F/U 6 weeks

## 2016-04-26 NOTE — Assessment & Plan Note (Signed)
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 We'll try her on clonazepam for the anxiety attacks. Discussed natural methods to help with the sleep including melatonin camomile lavender she's also to get back to doing some relaxing activities just for herself she enjoys crocheting. She will follow-up in 6 weeks we will see how much clonazepam she is required. Discussed how this can be a dependent medication. She is using a good amount there we will need to try daily medication which she declines today

## 2016-04-26 NOTE — Progress Notes (Signed)
   Subjective:    Patient ID: Dominique Davis, female    DOB: 1985/11/27, 30 y.o.   MRN: [id number]  Patient presents for Anxiety (reports that she is having more frequent panic attacks- increased stressors in life) Patient with increased anxiety and panic attacks. She is history of anxiety disorder has had panic attacks in the past but was never on medication she saw her mother who was heavily medicated. She is always try to deal with it using massage or other stress relievers. She is now working full time and also a Physicist, medicalfull-time student she has 1 year left to get her degree. She has not one change anything with her schoolwork because she has worked so hard on her current path. She would like to be settled and get married in being a nontraditional student has been quite stressful. On-the-job she's been pulling extra weight from some employees who have not been getting things done on time. 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 She would like to have something to use as needed when she has these episodes. She's had a few at work where she had to leave off of the floor just to calm herself down. Her appetite is good her sleep is fair optimistic that she is either doing homework or she is so stressed that she cannot sleep    Review Of Systems:  GEN- denies fatigue, fever, weight loss,weakness, recent illness HEENT- denies eye drainage, change in vision, nasal discharge, CVS- denies chest pain, palpitations RESP- denies SOB, cough, wheeze ABD- denies N/V, change in stools, abd pain GU- denies dysuria, hematuria, dribbling, incontinence MSK- denies joint pain, muscle aches, injury Neuro- denies headache, dizziness, syncope, seizure activity       Objective:    BP 122/64 (BP Location: Left Arm, Patient Position: Sitting, Cuff Size: Large)   Pulse 84   Temp 98.3 F (36.8 C) (Oral)   Resp 14   Ht 5\' 7"  (1.702 m)   Wt 202 lb (91.6 kg)   SpO2 99%   BMI 31.64 kg/m  GEN- NAD, alert and  oriented x3 Psych -pleasant normal affect and mood normal speech , normal thought process        Assessment & Plan:      Problem List Items Addressed This Visit    GAD (generalized anxiety disorder) - Primary    She is overextending herself but does not have very many options to cut back with trying to finish her degree within the next year. We'll try her on clonazepam for the anxiety attacks. Discussed natural methods to help with the sleep including melatonin camomile lavender she's also to get back to doing some relaxing activities just for herself she enjoys crocheting. She will follow-up in 6 weeks we will see how much clonazepam she is required. Discussed how this can be a dependent medication. She is using a good amount there we will need to try daily medication which she declines today         Note: This dictation was prepared with Dragon dictation along with smaller phrase technology. Any transcriptional errors that result from this process are unintentional.

## 2016-06-04 ENCOUNTER — Emergency Department
Admission: EM | Admit: 2016-06-04 | Discharge: 2016-06-04 | Disposition: A | Discharge status: Home / Self Care | Payer: Worker's Compensation | Attending: Emergency Medicine | Admitting: Emergency Medicine

## 2016-06-04 ENCOUNTER — Encounter: Payer: Self-pay | Admitting: Emergency Medicine

## 2016-06-04 DIAGNOSIS — Y9389 Activity, other specified: Secondary | ICD-10-CM | POA: Insufficient documentation

## 2016-06-04 DIAGNOSIS — W458XXA Other foreign body or object entering through skin, initial encounter: Secondary | ICD-10-CM | POA: Insufficient documentation

## 2016-06-04 DIAGNOSIS — Y999 Unspecified external cause status: Secondary | ICD-10-CM | POA: Diagnosis not present

## 2016-06-04 DIAGNOSIS — Y9289 Other specified places as the place of occurrence of the external cause: Secondary | ICD-10-CM | POA: Insufficient documentation

## 2016-06-04 DIAGNOSIS — S61241A Puncture wound with foreign body of left index finger without damage to nail, initial encounter: Secondary | ICD-10-CM | POA: Diagnosis not present

## 2016-06-04 DIAGNOSIS — Z7721 Contact with and (suspected) exposure to potentially hazardous body fluids: Secondary | ICD-10-CM | POA: Diagnosis not present

## 2016-06-04 DIAGNOSIS — W461XXA Contact with contaminated hypodermic needle, initial encounter: Secondary | ICD-10-CM

## 2016-06-04 LAB — CBC
HEMATOCRIT: 41.2 % (ref 35.0–47.0)
Hemoglobin: 14 g/dL (ref 12.0–16.0)
MCH: 28.5 pg (ref 26.0–34.0)
MCHC: 34 g/dL (ref 32.0–36.0)
MCV: 84.1 fL (ref 80.0–100.0)
PLATELETS: 201 10*3/uL (ref 150–440)
RBC: 4.9 MIL/uL (ref 3.80–5.20)
RDW: 13.4 % (ref 11.5–14.5)
WBC: 6.2 10*3/uL (ref 3.6–11.0)

## 2016-06-04 LAB — URINALYSIS, COMPLETE (UACMP) WITH MICROSCOPIC
Bilirubin Urine: NEGATIVE
Glucose, UA: NEGATIVE mg/dL
Hgb urine dipstick: NEGATIVE
Ketones, ur: NEGATIVE mg/dL
NITRITE: NEGATIVE
Protein, ur: NEGATIVE mg/dL
SPECIFIC GRAVITY, URINE: 1.008 (ref 1.005–1.030)
pH: 7 (ref 5.0–8.0)

## 2016-06-04 LAB — AST: AST: 30 U/L (ref 15–41)

## 2016-06-04 LAB — ALT: ALT: 40 U/L (ref 14–54)

## 2016-06-04 LAB — CREATININE, SERUM
Creatinine, Ser: 0.67 mg/dL (ref 0.44–1.00)
GFR calc non Af Amer: >60 mL/min (ref >60)

## 2016-06-04 LAB — PREGNANCY, URINE: PREG TEST UR: NEGATIVE

## 2016-06-04 MED ORDER — EMTRICITABINE-TENOFOVIR DF 200-300 MG PO TABS: 1.0000 | ORAL_TABLET | Freq: Every day | ORAL | 0 refills | Status: DC

## 2016-06-04 MED ORDER — DOLUTEGRAVIR SODIUM 50 MG PO TABS: 50.0000 mg | ORAL_TABLET | Freq: Every day | ORAL | 0 refills | Status: DC

## 2016-06-04 NOTE — ED Triage Notes (Signed)
Pt reports she was at work at [employer] and was working on a Restaurant manager, fast foodmachine changing a needle when she got a puncture wound to her left index finger. The needle had been exposed to urine samples. Pt reports she did not know she was stuck until she saw blood on her glove. Unable to see the wound at this time. No bleeding at this time. Pt washed the wound with soap, water and alcohol. The

## 2016-06-04 NOTE — ED Notes (Signed)
Patient reports she was stuck with dirty/urine exposed needle on index finger left hand at 0330 today. Patient reports scant bleeding at site after stick. Patient washed site for 30-60 seconds after stick. Pt denies pain/inflammation to site.

## 2016-06-04 NOTE — ED Notes (Signed)
RN walked patient's specimens down to lab and placed them in lab technicians hands/custody

## 2016-06-04 NOTE — ED Notes (Signed)
Charge nurse made aware of patient.

## 2016-06-04 NOTE — Discharge Instructions (Signed)
You were given information and prescriptions to consider Post-Exposure Prophylaxis by Dr. Don PerkingVeronese.  You need to see your doctor within 72 hours if you start the medications, otherwise, within 1 week.  Return to the ER for any new or worsening condition including redness or skin rash, fever, or any other symptoms concerning to you.

## 2016-06-04 NOTE — ED Provider Notes (Signed)
I accepted this patient at shift change, pending laboratory studies, specifically creatinine for medications prescribed for post exposure prophylaxis to HIV.  Dr. Don PerkingVeronese had spoken with the poison center apparently recommending postexposure prophylaxis, but given what seems to be low risk by patient explanation Dr. Latanya MaudlinVernonese had discussed patient has capability to be presented risks and benefits and make her own decisions.  I discussed again with patient, and went over risks and benefits.  Most importantly, if she wanted to start the medications, we could start them here in the ER.  She states that after discussion with Dr. Don PerkingVeronese and speaking with her friends, she felt like she probably was not going to take the postexposure prophylaxis.  I did let her know however that certainly if she was going to start medications, sooner rather than later was the most important aspect. she did not want to start medications in the ER.    I did give her my honest opinion about what I would personally do, based on what I understand exposure to be, I personally would not take the medications.  She did describe to me that the piercer that stuck her sort of grabs the outside of the plastic top to the urine specimen and flicks it off.  No real exposure to urine other than what might be on the outside of the container cap.   Governor Rooksebecca Juelz Claar, MD 06/04/16 [phone]

## 2016-06-04 NOTE — ED Notes (Signed)
Pt given prescription for prophylaxis for blood exposure. Pt states would like to wait and take mediation after she discusses it with her boyfriend. Pt given handout with information on each drug and follow up recommendations.

## 2016-06-04 NOTE — ED Provider Notes (Signed)
[facility] Emergency Department Provider Note  ____________________________________________  Time seen: Approximately 6:58 AM  I have reviewed the triage vital signs and the nursing notes.   HISTORY  Chief Complaint Body Fluid Exposure   HPI Dominique Davis is a 30 y.o. female who presents for evaluation after body fluid exposure. 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 She is not sure if the needle ever touches the urine. She reports that the needle is used to open multiple different urinalysis cups. She reports that she felt a poke on her left index finger and when she looked at it she noticed a drop of blood. She wash with warm water and soap. Patient denies any history of immunosuppression. She only takes medications for anxiety.  Past Medical History:  Diagnosis Date  . Anxiety   . Depression   . Dysmenorrhea   . Migraines   . Obesity   . Small intestinal bacterial overgrowth AUG 2015   CIP/FLAG X7 DAYS(2X 2015)    Patient Active Problem List   Diagnosis Date Noted  . Encounter for insertion of mirena IUD 03/01/2016  . Insomnia 10/05/2015  . Depression with anxiety 10/05/2015  . Food allergy 03/04/2015  . Anterior knee pain 03/31/2014  . Encounter for surveillance of contraceptive pills 03/27/2014  . Abdominal pain, epigastric 11/21/2013  . Lower abdominal pain 11/21/2013  . Diarrhea 11/21/2013  . Loss of weight 11/21/2013  . Environmental allergies 10/16/2013  . Gluten intolerance 10/16/2013  . Migraine headache 04/27/2013  . GAD (generalized anxiety disorder) 04/27/2013  . Chronic back pain 04/27/2013  . Obesity 08/30/2011    Past Surgical History:  Procedure Laterality Date  . BACTERIAL OVERGROWTH TEST N/A 01/03/2014   CANCELLED  . BACTERIAL OVERGROWTH TEST N/A 01/10/2014   0 PPM AT START, 65 PPM(75 MINS), 58 PPM(105 MINS)  .  WISDOM TOOTH EXTRACTION     x2    Prior to Admission medications   Medication Sig Start Date End Date Taking? Authorizing Provider  clonazePAM (KLONOPIN) 0.5 MG tablet Take 1 tablet (0.5 mg total) by mouth 2 (two) times daily as needed for anxiety. 04/26/16   Salley ScarletKawanta F [place], MD  dolutegravir (TIVICAY) 50 MG tablet Take 1 tablet (50 mg total) by mouth daily. 06/04/16 07/02/16  Nita Sicklearolina Saurabh Hettich, MD  emtricitabine-tenofovir (TRUVADA) 200-300 MG tablet Take 1 tablet by mouth daily. 06/04/16 07/02/16  Nita Sicklearolina Tienna Bienkowski, MD  EPINEPHrine 0.3 mg/0.3 mL IJ SOAJ injection Inject 0.3 mLs (0.3 mg total) into the muscle once. 10/05/15   Salley ScarletKawanta F [place], MD  methocarbamol (ROBAXIN) 500 MG tablet Take 1 tablet (500 mg total) by mouth every 8 (eight) hours as needed for muscle spasms. 10/05/15   Salley ScarletKawanta F [place], MD    Allergies Patient has no known allergies.  Family History  Problem Relation Age of Onset  . Diabetes Mother     gastroparesis  . Hypertension Mother   . Anxiety disorder Mother   . Mental illness Mother     Bipolar Disorder   . Hypertension Father   . Diabetes Maternal Grandmother   . Hypertension Maternal Grandmother   . Hypertension Paternal Grandmother   . Inflammatory bowel disease Neg Hx   . Colon cancer Neg Hx     Social History Social History  Substance Use Topics  . Smoking status: Never Smoker  . Smokeless tobacco: Never Used  Comment: Never smoked  . Alcohol use Yes     Comment: rare    Review of Systems  Constitutional: Negative for fever. Eyes: Negative for visual changes. ENT: Negative for sore throat. Neck: No neck pain  Cardiovascular: Negative for chest pain. Respiratory: Negative for shortness of breath. Gastrointestinal: Negative for abdominal pain, vomiting or diarrhea. Genitourinary: Negative for dysuria. Musculoskeletal: Negative for back pain. Skin: Negative for rash. Neurological: Negative for headaches, weakness or numbness. Psych: No SI  or HI  ____________________________________________   PHYSICAL EXAM:  VITAL SIGNS: ED Triage Vitals  Enc Vitals Group     BP 06/04/16 0429 117/82     Pulse Rate 06/04/16 0429 80     Resp 06/04/16 0429 18     Temp 06/04/16 0429 98.2 F (36.8 C)     Temp Source 06/04/16 0429 Oral     SpO2 06/04/16 0429 100 %     Weight 06/04/16 0430 200 lb (90.7 kg)     Height 06/04/16 0430 5\' 7"  (1.702 m)     Head Circumference --      Peak Flow --      Pain Score 06/04/16 0431 0     Pain Loc --      Pain Edu? --      Excl. in GC? --     Constitutional: Alert and oriented. Well appearing and in no apparent distress. HEENT:      Head: Normocephalic and atraumatic.         Eyes: Conjunctivae are normal. Sclera is non-icteric. EOMI. PERRL      Mouth/Throat: Mucous membranes are moist.       Neck: Supple with no signs of meningismus. Cardiovascular: Regular rate and rhythm. No murmurs, gallops, or rubs. 2+ symmetrical distal pulses are present in all extremities. No JVD. Respiratory: Normal respiratory effort. Lungs are clear to auscultation bilaterally. No wheezes, crackles, or rhonchi.  Gastrointestinal: Soft, non tender, and non distended with positive bowel sounds. No rebound or guarding. Genitourinary: No CVA tenderness. Musculoskeletal: Nontender with normal range of motion in all extremities. No edema, cyanosis, or erythema of extremities. Neurologic: Normal speech and language. Face is symmetric. Moving all extremities. No gross focal neurologic deficits are appreciated. Skin: Skin is warm, dry and intact. No rash noted. Psychiatric: Mood and affect are normal. Speech and behavior are normal.  ____________________________________________   LABS (all labs ordered are listed, but only abnormal results are displayed)  Labs Reviewed  CBC  URINALYSIS, COMPLETE (UACMP) WITH MICROSCOPIC  AST  ALT  CREATININE, SERUM  PREGNANCY, URINE    ____________________________________________  EKG  none  ____________________________________________  RADIOLOGY  none  ____________________________________________   PROCEDURES  Procedure(s) performed: None Procedures Critical Care performed:  None ____________________________________________   INITIAL IMPRESSION / ASSESSMENT AND PLAN / ED COURSE  30 y.o. female who presents for evaluation after body fluid exposure after being poked in her left index finger by a needle from the lab machine that is used to remove lids of urinalysis cups. I spoke with poison control center who recommended treating this as a high risk exposure. Labs done based on [facility] protocol. Discussed with patient and recommendations per poison control. Patient will be given prescription for HIV PEP and information on the drugs. Recommended close f/u with her PCP for further management.   Clinical Course     Pertinent labs & imaging results that were available during my care of the patient were reviewed by me and considered in my medical decision  making (see chart for details).    ____________________________________________   FINAL CLINICAL IMPRESSION(S) / ED DIAGNOSES  Final diagnoses:  Accidental needlestick injury with exposure to body fluid      NEW MEDICATIONS STARTED DURING THIS VISIT:  New Prescriptions   DOLUTEGRAVIR (TIVICAY) 50 MG TABLET    Take 1 tablet (50 mg total) by mouth daily.   EMTRICITABINE-TENOFOVIR (TRUVADA) 200-300 MG TABLET    Take 1 tablet by mouth daily.     Note:  This document was prepared using Dragon voice recognition software and may include unintentional dictation errors.    Nita Sicklearolina Mesha Schamberger, MD 06/04/16 [phone]

## 2016-06-04 NOTE — ED Notes (Signed)
RN drew 3 tiger tops, and one short lavender top tube of blood. RN wrote EE, date, DOB, and last 4 of patient's social on [facility] paperwork, and on [facility] tube labels. RN affixed these labels to aforementioned tubes. RN followed policy listed in red folder for exposures for the labeling and drawing of patient's specimens

## 2016-06-14 ENCOUNTER — Ambulatory Visit: Payer: 59 | Admitting: Family Medicine

## 2016-06-20 ENCOUNTER — Encounter: Payer: Self-pay | Admitting: Family Medicine

## 2016-06-20 ENCOUNTER — Ambulatory Visit (INDEPENDENT_AMBULATORY_CARE_PROVIDER_SITE_OTHER): Payer: 59 | Admitting: Family Medicine

## 2016-06-20 VITALS — BP 122/60 | HR 78 | Temp 98.5°F | Resp 14 | Ht 67.0 in | Wt 207.0 lb

## 2016-06-20 DIAGNOSIS — Z23 Encounter for immunization: Secondary | ICD-10-CM | POA: Diagnosis not present

## 2016-06-20 DIAGNOSIS — F411 Generalized anxiety disorder: Secondary | ICD-10-CM | POA: Diagnosis not present

## 2016-06-20 DIAGNOSIS — F5101 Primary insomnia: Secondary | ICD-10-CM

## 2016-06-20 MED ORDER — EPINEPHRINE 0.3 MG/0.3ML IJ SOAJ: 0.3000 mg | Freq: Once | INTRAMUSCULAR | 1 refills | Status: AC

## 2016-06-20 NOTE — Assessment & Plan Note (Signed)
She does not use very much of the clonazepam for now we will just continue as needed. I do not recommend and her anything longer acting for sleep because she has very limited time to sleep at this time only getting about 6 hours do not want to cause any more increase somnolence during her school day or work. We discussed exercising as much as she can to help with sleep naturally. Also monitoring her diet.

## 2016-06-20 NOTE — Patient Instructions (Addendum)
Continue current regimine F/U 3 months

## 2016-06-20 NOTE — Progress Notes (Signed)
   Subjective:    Patient ID: Dominique Davis, female    DOB: December 24, 1985, 31 y.o.   MRN: [id number]  Patient presents for Follow-up (is not fasting) Patient here to follow-up anxiety. Her last visit she has some increased anxiety with overextending herself finishing her degree as well as working. She was started on clonazepam to use as needed. She's also been doing melatonin and camomile naturally to help with her sleep. Recently started school back from holiday break And having troubles with her work as a making her work overtime not given her time to rest before having to get up a morning classes. She is working third shift. She states that she is only using a few of the clonazepam out of the first prescription and it does work when she needs it. She is trying to reserve for the times when she feels very anxious. She has started working out again recently has gained 5 pounds as her last visit she does know that she sleeps better when she does exercise.  Note she was seen in the emergency room after needlestick she was working in the urinalysis unit therefore there was low risk of HIV transmission but she was prescribed the medication states that she did not take them. This has been [facility] and they have her on a protocol for testing.     Review Of Systems:  GEN- denies fatigue, fever, weight loss,weakness, recent illness HEENT- denies eye drainage, change in vision, nasal discharge, CVS- denies chest pain, palpitations RESP- denies SOB, cough, wheeze ABD- denies N/V, change in stools, abd pain GU- denies dysuria, hematuria, dribbling, incontinence MSK- denies joint pain, muscle aches, injury Neuro- denies headache, dizziness, syncope, seizure activity       Objective:    BP 122/60 (BP Location: Left Arm, Patient Position: Sitting, Cuff Size: Large)   Pulse 78   Temp 98.5 F (36.9 C) (Oral)   Resp 14   Ht 5\' 7"  (1.702 m)   Wt  207 lb (93.9 kg)   SpO2 99%   BMI 32.42 kg/m  GEN- NAD, alert and oriented x3 Psych- normal affect and mood        Assessment & Plan:      Problem List Items Addressed This Visit    Insomnia - Primary   GAD (generalized anxiety disorder)    She does not use very much of the clonazepam for now we will just continue as needed. I do not recommend and her anything longer acting for sleep because she has very limited time to sleep at this time only getting about 6 hours do not want to cause any more increase somnolence during her school day or work. We discussed exercising as much as she can to help with sleep naturally. Also monitoring her diet.       Other Visit Diagnoses    Need for prophylactic vaccination and inoculation against influenza       Relevant Orders   Flu Vaccine QUAD 36+ mos PF IM (Fluarix & Fluzone Quad PF)      Note: This dictation was prepared with Dragon dictation along with smaller Lobbyistphrase technology. Any transcriptional errors that result from this process are unintentional.

## 2016-07-08 ENCOUNTER — Telehealth: Payer: Self-pay | Admitting: *Deleted

## 2016-07-08 NOTE — Telephone Encounter (Signed)
Received fax from [employer] for [employer] forms. [phone]~telephone/ [phone]~ fax.  Call placed to patient for more information.  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

## 2016-07-11 NOTE — Telephone Encounter (Signed)
Received completed FMLA forms from provider.   No charge per provider.   Faxed to [employer].   Call placed to patient and patient made aware per VM.

## 2016-07-11 NOTE — Telephone Encounter (Signed)
Of note, last FMLA was filed for chronic back pain only.

## 2016-07-11 NOTE — Telephone Encounter (Signed)
Call placed to patient. LMTRC.  

## 2016-07-11 NOTE — Telephone Encounter (Signed)
Patient returned call.   Reason FMLA requested: back/ knee pain Patient requesting 12 mth period on FMLA.   Requested Beginning Date: intermittent  Verbalized that fee may be charged and is per provider prerogative.   Forms routed to provider.

## 2016-07-18 ENCOUNTER — Ambulatory Visit: Payer: 59 | Admitting: Family Medicine

## 2016-09-21 ENCOUNTER — Ambulatory Visit (INDEPENDENT_AMBULATORY_CARE_PROVIDER_SITE_OTHER): Payer: 59 | Admitting: Family Medicine

## 2016-09-21 ENCOUNTER — Encounter: Payer: Self-pay | Admitting: Family Medicine

## 2016-09-21 VITALS — BP 124/64 | HR 74 | Temp 98.3°F | Resp 16 | Ht 67.0 in | Wt 215.0 lb

## 2016-09-21 DIAGNOSIS — F331 Major depressive disorder, recurrent, moderate: Secondary | ICD-10-CM | POA: Diagnosis not present

## 2016-09-21 DIAGNOSIS — F5101 Primary insomnia: Secondary | ICD-10-CM

## 2016-09-21 DIAGNOSIS — F411 Generalized anxiety disorder: Secondary | ICD-10-CM | POA: Diagnosis not present

## 2016-09-21 DIAGNOSIS — E6609 Other obesity due to excess calories: Secondary | ICD-10-CM | POA: Diagnosis not present

## 2016-09-21 DIAGNOSIS — R1030 Lower abdominal pain, unspecified: Secondary | ICD-10-CM

## 2016-09-21 DIAGNOSIS — Z6833 Body mass index (BMI) 33.0-33.9, adult: Secondary | ICD-10-CM

## 2016-09-21 DIAGNOSIS — E66811 Obesity, class 1: Secondary | ICD-10-CM

## 2016-09-21 MED ORDER — LEVONORGESTREL 20 MCG/24HR IU IUD: 1.0000 | INTRAUTERINE_SYSTEM | Freq: Once | INTRAUTERINE | 0 refills | Status: DC

## 2016-09-21 MED ORDER — SERTRALINE HCL 25 MG PO TABS: ORAL_TABLET | ORAL | 2 refills | Status: DC

## 2016-09-21 MED ORDER — CLONAZEPAM 0.5 MG PO TABS: 0.5000 mg | ORAL_TABLET | Freq: Two times a day (BID) | ORAL | 1 refills | Status: DC | PRN

## 2016-09-21 NOTE — Assessment & Plan Note (Signed)
Long-standing problem for her with her depression and anxiety insomnia. They are all tied together and she is trying to work and go to school. She is very close to finishing her degree therefore does not want to change her classes. She does have mental illness in her family. Her mother is on Zoloft. I will try her on Zoloft 25 mg and increased 50 mg to see how she does. She can continue the clonazepam as needed  We discussed dietary changes she can make as well as exercise which she states she does have time for class but often just sits in her car and sleeps  Guarded to the intermittent pelvic pain. She denies any vaginal discharge no current pain. Possible she has some ovarian cysts is also possible something is going in with her IUD recommended she schedule follow-up with her GYN ultrasound can be done.

## 2016-09-21 NOTE — Progress Notes (Signed)
Subjective:    Patient ID: Dominique Davis, female    DOB: 1986/01/30, 31 y.o.   MRN: [id number]  Patient presents for 3 month F/U (is not fasting) and Lower Abd Pain (intermitent pain- thinks its d/t IUD)   Pt here to f/u -She still working full-time and in school. She is very stressed between the 2 states that she cries when she goes to work and cries when she goes to school she just feels overwhelmed but is trying really hard to finish her degree. She also is not losing weight and this makes her more depressed. She has some difficulties with her supervisor at work which makes things even more difficult. She does not take the clonazepam every day but it does help some when she takes it. She's not had any recent panic attacks.   She is still not sleeping well which makes her fatigued during the day   Stabbing pain in left side which is irregular sometimes occur when period is suppose to start , this has been going on for months. mirregular menses, no pain with urinaation, no vaginal dischage, no chagne in bowels, very short lived, no meds taken, concerned it is her IUD      Review Of Systems:  GEN- + fatigue, fever, weight loss,weakness, recent illness HEENT- denies eye drainage, change in vision, nasal discharge, CVS- denies chest pain, palpitations RESP- denies SOB, cough, wheeze ABD- denies N/V, change in stools, abd pain GU- denies dysuria, hematuria, dribbling, incontinence MSK- denies joint pain, muscle aches, injury Neuro- denies headache, dizziness, syncope, seizure activity       Objective:    BP 124/64   Pulse 74   Temp 98.3 F (36.8 C) (Oral)   Resp 16   Ht  (1.702 m)   Wt 215 lb (97.5 kg)   SpO2 96%   BMI 33.67 kg/m  GEN- NAD, alert and oriented x3 CVS- RRR, no murmur RESP-CTAB ABD-NABS,soft,NT,ND, no CVA tenderness Psych- normal affect and mood        Assessment & Plan:      Problem List Items Addressed This Visit    Obesity   MDD (major  depressive disorder)    Long-standing problem for her with her depression and anxiety insomnia. They are all tied together and she is trying to work and go to school. She is very close to finishing her degree therefore does not want to change her classes. She does have mental illness in her family. Her mother is on Zoloft. I will try her on Zoloft 25 mg and increased 50 mg to see how she does. She can continue the clonazepam as needed  We discussed dietary changes she can make as well as exercise which she states she does have time for class but often just sits in her car and sleeps  Guarded to the intermittent pelvic pain. She denies any vaginal discharge no current pain. Possible she has some ovarian cysts is also possible something is going in with her IUD recommended she schedule follow-up with her GYN ultrasound can be done.      Relevant Medications   sertraline (ZOLOFT) 25 MG tablet   Insomnia   GAD (generalized anxiety disorder) - Primary    Other Visit Diagnoses    Lower abdominal pain          Note: This dictation was prepared with Dragon dictation along with smaller phrase technology. Any transcriptional errors that result from this process are unintentional.

## 2016-09-21 NOTE — Patient Instructions (Addendum)
Take 1 tablet of zoloft for 1 week, then go to 2 tablets  Schedule with GYN for the IUD F/U 4 weeks

## 2016-10-21 ENCOUNTER — Ambulatory Visit (INDEPENDENT_AMBULATORY_CARE_PROVIDER_SITE_OTHER): Payer: 59 | Admitting: Family Medicine

## 2016-10-21 ENCOUNTER — Encounter: Payer: Self-pay | Admitting: Family Medicine

## 2016-10-21 VITALS — BP 122/68 | HR 80 | Temp 98.0°F | Resp 12 | Ht 67.0 in | Wt 212.0 lb

## 2016-10-21 DIAGNOSIS — F331 Major depressive disorder, recurrent, moderate: Secondary | ICD-10-CM

## 2016-10-21 DIAGNOSIS — F411 Generalized anxiety disorder: Secondary | ICD-10-CM

## 2016-10-21 MED ORDER — SERTRALINE HCL 50 MG PO TABS: ORAL_TABLET | ORAL | 2 refills | Status: DC

## 2016-10-21 NOTE — Assessment & Plan Note (Signed)
Continue with her exercise program which she recently started which will also help with her depression and mood.

## 2016-10-21 NOTE — Patient Instructions (Addendum)
Continue zoloft 50mg   F/U 3  month

## 2016-10-21 NOTE — Progress Notes (Signed)
   Subjective:    Patient ID: Dominique Davis, female    DOB: February 21, 1986, 31 y.o.   MRN: [id number]  Patient presents for Follow-up  Patient here to follow-up Zoloft. She was started on Zoloft and titrated to 50 mg the last visit for her anxiety and depression. Feels like this medication as helped her a lot. She has not had to use the clonazepam since the last visit. Her significant other can tell a difference. She is handling stress at work and school much better. She has no concerns about the medication. She is sleeping fairly well and is Dominique Davis started back with a trainer. Of note her previous intermittent pelvic discomfort actually resolved and she did not see GYN.   Review Of Systems:  GEN- denies fatigue, fever, weight loss,weakness, recent illness HEENT- denies eye drainage, change in vision, nasal discharge, CVS- denies chest pain, palpitations RESP- denies SOB, cough, wheeze ABD- denies N/V, change in stools, abd pain GU- denies dysuria, hematuria, dribbling, incontinence MSK- denies joint pain, muscle aches, injury Neuro- denies headache, dizziness, syncope, seizure activity       Objective:    BP 122/68   Pulse 80   Temp 98 F (36.7 C) (Oral)   Resp 12   Ht 5\' 7"  (1.702 m)   Wt 212 lb (96.2 kg)   SpO2 99%   BMI 33.20 kg/m  GEN- NAD, alert and oriented x3 Psych- normal affect and mood        Assessment & Plan:      Problem List Items Addressed This Visit    MDD (major depressive disorder)    Continue with her exercise program which she recently started which will also help with her depression and mood.      Relevant Medications   sertraline (ZOLOFT) 50 MG tablet   GAD (generalized anxiety disorder) - Primary    Doing well with the Zoloft will continue at the current dose 50 mg daily. She will call if there are any concerns or feels like her medication is not working to his potential.         Note: This dictation was prepared with Nurse, children'sDragon dictation  along with smaller Lobbyistphrase technology. Any transcriptional errors that result from this process are unintentional.

## 2016-10-21 NOTE — Assessment & Plan Note (Signed)
Doing well with the Zoloft will continue at the current dose 50 mg daily. She will call if there are any concerns or feels like her medication is not working to his potential.

## 2016-11-15 ENCOUNTER — Telehealth: Payer: Self-pay | Admitting: *Deleted

## 2016-11-15 NOTE — Telephone Encounter (Signed)
Received call from patient.   Reports that she is no longer able to fill long term prescriptions at retail pharmacy. States that she is to use mail order. Per pharmacy, [facility] is prescription manager.   Call placed to patient to inquire as to if she wants to send prescription through to [facility]. LMTRC.

## 2016-11-18 NOTE — Telephone Encounter (Signed)
Call placed to patient and patient made aware per VM.  

## 2016-11-22 ENCOUNTER — Other Ambulatory Visit: Payer: Self-pay | Admitting: *Deleted

## 2016-11-22 MED ORDER — SERTRALINE HCL 50 MG PO TABS: ORAL_TABLET | ORAL | 2 refills | Status: DC

## 2017-01-04 ENCOUNTER — Encounter: Payer: Self-pay | Admitting: Family Medicine

## 2017-01-04 ENCOUNTER — Ambulatory Visit (INDEPENDENT_AMBULATORY_CARE_PROVIDER_SITE_OTHER): Payer: 59 | Admitting: Family Medicine

## 2017-01-04 VITALS — BP 124/76 | HR 73 | Temp 98.8°F | Resp 18 | Ht 67.0 in | Wt 210.0 lb

## 2017-01-04 DIAGNOSIS — M545 Low back pain, unspecified: Secondary | ICD-10-CM

## 2017-01-04 DIAGNOSIS — E6609 Other obesity due to excess calories: Secondary | ICD-10-CM | POA: Diagnosis not present

## 2017-01-04 DIAGNOSIS — Z0000 Encounter for general adult medical examination without abnormal findings: Secondary | ICD-10-CM | POA: Diagnosis not present

## 2017-01-04 DIAGNOSIS — F331 Major depressive disorder, recurrent, moderate: Secondary | ICD-10-CM

## 2017-01-04 DIAGNOSIS — Z6833 Body mass index (BMI) 33.0-33.9, adult: Secondary | ICD-10-CM

## 2017-01-04 DIAGNOSIS — G8929 Other chronic pain: Secondary | ICD-10-CM

## 2017-01-04 DIAGNOSIS — Z114 Encounter for screening for human immunodeficiency virus [HIV]: Secondary | ICD-10-CM | POA: Diagnosis not present

## 2017-01-04 DIAGNOSIS — F411 Generalized anxiety disorder: Secondary | ICD-10-CM

## 2017-01-04 NOTE — Patient Instructions (Signed)
F/U 6 months  Get labs done and return to me

## 2017-01-04 NOTE — Assessment & Plan Note (Signed)
Currently stable on zoloft, decreased use of benzo, no change to dose

## 2017-01-04 NOTE — Progress Notes (Signed)
   Subjective:    Patient ID: Dominique BelfastVictoria G Davis, female    DOB: March 21, 1986, 31 y.o.   MRN: [id number]  Patient presents for Annual Exam Patient here for complete physical exam. No specific concerns today. Pap smear is up-to-date done in 2017 was normal. She declines any STD screening. No change in family history. She has a wellness form that needs to be completed as well as fasting labs.  She still taking her Zoloft as prescribed and uses her clonazepam very intermittently feels that she is doing well with school and work balance now.  Obesity she recently started Weight Watchers is going to start back her exercise program. Due to work and school schedule she is not working out with a Psychologist, educationaltrainer. Her first goal is to get below 200 pounds again.  She needs her FMLA done again for her chronic back pain.  Review Of Systems:  GEN- denies fatigue, fever, weight loss,weakness, recent illness HEENT- denies eye drainage, change in vision, nasal discharge, CVS- denies chest pain, palpitations RESP- denies SOB, cough, wheeze ABD- denies N/V, change in stools, abd pain GU- denies dysuria, hematuria, dribbling, incontinence MSK- denies joint pain, muscle aches, injury Neuro- denies headache, dizziness, syncope, seizure activity       Objective:    BP 124/76   Pulse 73   Temp 98.8 F (37.1 C)   Resp 18   Ht 5\' 7"  (1.702 m)   Wt 210 lb (95.3 kg)   SpO2 97%   BMI 32.89 kg/m  GEN- NAD, alert and oriented x3 HEENT- PERRL, EOMI, non injected sclera, pink conjunctiva, MMM, oropharynx clear, TM Clear bilat  Neck- Supple, no thyromegaly CVS- RRR, no murmur RESP-CTAB ABD-NABS,soft,NT,ND Psych- normal affect and mood  EXT- No edema Pulses- Radial, DP- 2+        Assessment & Plan:      Problem List Items Addressed This Visit    Obesity   GAD (generalized anxiety disorder)   MDD (major depressive disorder)    Currently stable on zoloft, decreased use of benzo, no change to dose        Other Visit Diagnoses    Routine general medical examination at a health care facility    -  Primary   CPE done, fasting labs, discussed healthy eating can eat around 1200 calories, with workouts. Wll complete wellness form and FMLA   Relevant Orders   CBC with Differential/Platelet   Comprehensive metabolic panel   Lipid panel   Hemoglobin A1c   Encounter for screening for HIV       Relevant Orders   HIV antibody      Note: This dictation was prepared with Dragon dictation along with smaller phrase technology. Any transcriptional errors that result from this process are unintentional.

## 2017-01-09 NOTE — Assessment & Plan Note (Signed)
Overall has not had many flares of pain FMLA completed needs every 6 months No chronic pain med control, only during flares

## 2017-01-17 ENCOUNTER — Encounter: Payer: Self-pay | Admitting: Women's Health

## 2017-01-21 ENCOUNTER — Encounter: Payer: Self-pay | Admitting: Family Medicine

## 2017-01-23 ENCOUNTER — Ambulatory Visit: Payer: 59 | Admitting: Family Medicine

## 2017-05-19 ENCOUNTER — Encounter: Payer: Self-pay | Admitting: Family Medicine

## 2017-05-23 ENCOUNTER — Ambulatory Visit: Payer: BLUE CROSS/BLUE SHIELD | Admitting: Family Medicine

## 2017-05-23 ENCOUNTER — Other Ambulatory Visit: Payer: Self-pay

## 2017-05-23 ENCOUNTER — Encounter: Payer: Self-pay | Admitting: Family Medicine

## 2017-05-23 VITALS — BP 122/70 | HR 80 | Temp 98.1°F | Resp 16 | Ht 67.0 in | Wt 215.0 lb

## 2017-05-23 DIAGNOSIS — G5621 Lesion of ulnar nerve, right upper limb: Secondary | ICD-10-CM | POA: Diagnosis not present

## 2017-05-23 NOTE — Patient Instructions (Signed)
F/U as needed

## 2017-05-23 NOTE — Progress Notes (Signed)
   Subjective:    Patient ID: Dominique Davis, female    DOB: 28-Jan-1986, 31 y.o.   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  She notices that her fingers fall asleep and she has to shake them out when she is on the phone occasionally when she is driving.  She has dropped some items due to the loss of sensation.  She does have some wrist brace at home but was wearing them very intermittently has not put them on the past couple weeks.  Her mother has had carpal tunnel surgery in both hands.   Review Of Systems:  GEN- denies fatigue, fever, weight loss,weakness, recent illness HEENT- denies eye drainage, change in vision, nasal discharge, CVS- denies chest pain, palpitations RESP- denies SOB, cough, wheeze ABD- denies N/V, change in stools, abd pain GU- denies dysuria, hematuria, dribbling, incontinence MSK+ joint pain, muscle aches, injury Neuro- denies headache, dizziness, syncope, seizure activity       Objective:    BP 122/70   Pulse 80   Temp 98.1 F (36.7 C) (Oral)   Resp 16   Ht 5\' 7"  (1.702 m)   Wt 215 lb (97.5 kg)   SpO2 98%   BMI 33.67 kg/m  GEN- NAD, alert and oriented x3 HEENT- PERRL, EOMI, non injected sclera, pink conjunctiva, MMM, oropharynx clear Neck- Supple, FROM Neuro- sensation grossly in tact UE bilat, monofilament in tact, neg phalens, tinels UE MSK- Normal strength UE, normal tone, normal grasp  Pulse- Radial 2+, cap refill < 3 sec        Assessment & Plan:      Problem List Items Addressed This Visit    None    Visit Diagnoses    Ulnar neuropathy of right upper extremity    -  Primary   Symptoms more ulnar side neuropathy, though may have some median involvement, discussed conservative treatment wearing the brace consistently. Discussed neuropathic medications but not severe at this time.  We also discussed that the next step will be a nerve conduction study.  She will wait about 4 weeks and if no improvement with the bracing will proceed with nerve conduction.  No she did ask about restrictions at work.  I gave her a letter saying that she can wear her wrist brace but otherwise not see any reason for any restrictions at this time      Note: This dictation was prepared with Dragon dictation along with smaller phrase technology. Any transcriptional errors that result from this process are unintentional.

## 2017-05-24 ENCOUNTER — Encounter: Payer: Self-pay | Admitting: Family Medicine

## 2017-05-31 ENCOUNTER — Encounter: Payer: Self-pay | Admitting: Family Medicine

## 2017-07-04 ENCOUNTER — Other Ambulatory Visit: Payer: Self-pay | Admitting: Family Medicine

## 2017-07-07 ENCOUNTER — Other Ambulatory Visit: Payer: Self-pay

## 2017-07-07 ENCOUNTER — Encounter: Payer: Self-pay | Admitting: Family Medicine

## 2017-07-07 ENCOUNTER — Ambulatory Visit: Payer: 59 | Admitting: Family Medicine

## 2017-07-07 VITALS — BP 128/68 | HR 82 | Temp 98.2°F | Resp 16 | Ht 67.0 in | Wt 221.0 lb

## 2017-07-07 DIAGNOSIS — F331 Major depressive disorder, recurrent, moderate: Secondary | ICD-10-CM

## 2017-07-07 DIAGNOSIS — F411 Generalized anxiety disorder: Secondary | ICD-10-CM | POA: Diagnosis not present

## 2017-07-07 DIAGNOSIS — Z6834 Body mass index (BMI) 34.0-34.9, adult: Secondary | ICD-10-CM | POA: Diagnosis not present

## 2017-07-07 DIAGNOSIS — J3489 Other specified disorders of nose and nasal sinuses: Secondary | ICD-10-CM

## 2017-07-07 DIAGNOSIS — Z23 Encounter for immunization: Secondary | ICD-10-CM

## 2017-07-07 DIAGNOSIS — Z30431 Encounter for routine checking of intrauterine contraceptive device: Secondary | ICD-10-CM

## 2017-07-07 DIAGNOSIS — E6609 Other obesity due to excess calories: Secondary | ICD-10-CM

## 2017-07-07 DIAGNOSIS — R0981 Nasal congestion: Secondary | ICD-10-CM

## 2017-07-07 DIAGNOSIS — F5101 Primary insomnia: Secondary | ICD-10-CM | POA: Diagnosis not present

## 2017-07-07 MED ORDER — SERTRALINE HCL 50 MG PO TABS: ORAL_TABLET | ORAL | 2 refills | Status: DC

## 2017-07-07 MED ORDER — FLUTICASONE PROPIONATE 50 MCG/ACT NA SUSP: 2.0000 | Freq: Every day | NASAL | 1 refills | Status: DC

## 2017-07-07 MED ORDER — TEMAZEPAM 15 MG PO CAPS: 15.0000 mg | ORAL_CAPSULE | Freq: Every evening | ORAL | 1 refills | Status: DC | PRN

## 2017-07-07 NOTE — Assessment & Plan Note (Signed)
Doing well on zoloft,  Will add restoril for sleep. Decided against ambien as she already sleepwalks Rare use of klonopin

## 2017-07-07 NOTE — Assessment & Plan Note (Signed)
Discussed looking at her overall calories making sure she is not overeating or undereating Not to take her exercise into account as this may make her overeat as well  Continue with her water intake

## 2017-07-07 NOTE — Progress Notes (Signed)
Subjective:    Patient ID: Dominique Davis, female    DOB: 1986/03/08, 32 y.o.   MRN: [id number]  Patient presents for Follow-up (is not fasting)  She here to follow-up depression medications.  She states her depression anxiety has been stable on Zoloft 50 mg and Klonopin 0.5 mg up to twice a day as needed however she only takes the setting of anxiety attack.  She is working full-time and still in  school.  Not sleeping well, taking zyquil, tried melatonin no longer working. Only sleeps a coupld of hours and between school and work is often sleep deprived and tired. Sleeps mostly on Thursday and the weekends  She does sleep walk    Obesity- staes she continues to gain weight despite . Goes to gym a few days a week. Monitor on fitbit and myfitnes pal, knows that sleep is and issue, tries to [employer] junk food and get her carbs through veggies     Has mirena, can not feel her strings, first few months period was still regular, then skipped a few months, still gets cramping around that time of month of her periods   Nasal congestion, and drainage, request a nasal spray      Meds reviewed    Review Of Systems:  GEN- denies fatigue, fever, weight loss,weakness, recent illness HEENT- denies eye drainage, change in vision, nasal discharge, CVS- denies chest pain, palpitations RESP- denies SOB, cough, wheeze ABD- denies N/V, change in stools, abd pain GU- denies dysuria, hematuria, dribbling, incontinence MSK- denies joint pain, muscle aches, injury Neuro- denies headache, dizziness, syncope, seizure activity       Objective:    BP 128/68   Pulse 82   Temp 98.2 F (36.8 C) (Oral)   Resp 16   Ht 5\' 7"  (1.702 m)   Wt 221 lb (100.2 kg)   SpO2 98%   BMI 34.61 kg/m  GEN- NAD, alert and oriented x3 HEENT- PERRL, EOMI, non injected sclera, pink conjunctiva, MMM, oropharynx clear, nares clear, no maxillary sinus tenderness, TM clear no effusion  Neck- Supple, no LAD  CVS- RRR, no  murmur RESP-CTAB GU- normal external genitalia, vaginal mucosa pink and moist, cervix visualized no growth,+blood form os, , no CMT, strings visible in position ABD-NABS,soft,NT,ND EXT- No edema Pulses- Radial, DP- 2+ Psych- Normal affect and mood - PHQ 9 score 8        Assessment & Plan:      Problem List Items Addressed This Visit      Unprioritized   Insomnia   GAD (generalized anxiety disorder)   Relevant Medications   sertraline (ZOLOFT) 50 MG tablet   Obesity    Discussed looking at her overall calories making sure she is not overeating or undereating Not to take her exercise into account as this may make her overeat as well  Continue with her water intake       MDD (major depressive disorder) - Primary    Doing well on zoloft,  Will add restoril for sleep. Decided against ambien as she already sleepwalks Rare use of klonopin      Relevant Medications   sertraline (ZOLOFT) 50 MG tablet    Other Visit Diagnoses    Nasal congestion with rhinorrhea       nasal saline and nasal steroid recommended    IUD check up       IUD in place      Note: This dictation was prepared with Dragon dictation along with  smaller phrase technology. Any transcriptional errors that result from this process are unintentional.

## 2017-07-07 NOTE — Patient Instructions (Addendum)
Try the temazepam  Continue the zoloft  Use flonase and nasal saline  F/U 6 months for Physical

## 2017-07-31 ENCOUNTER — Other Ambulatory Visit: Payer: Self-pay | Admitting: Family Medicine

## 2017-08-31 ENCOUNTER — Telehealth: Payer: Self-pay | Admitting: Family Medicine

## 2017-08-31 NOTE — Telephone Encounter (Signed)
FMLA forms dropped off placed into yellow folder.  °

## 2017-08-31 NOTE — Telephone Encounter (Signed)
Received fax from [facility] Renato Gails([employer]) for [employer]ML[employer] forms.   Call placed to patient for more information.   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   Forms routed to provider.

## 2017-08-31 NOTE — Telephone Encounter (Signed)
Awaiting forms

## 2017-09-01 NOTE — Telephone Encounter (Signed)
noted 

## 2017-09-13 NOTE — Telephone Encounter (Signed)
[facility] requesting clarification of FMLA.   Routed to provider.

## 2017-09-15 NOTE — Telephone Encounter (Signed)
Received corrected form.   Faxed to [employer].   Call placed to patient and patient made aware.

## 2017-09-15 NOTE — Telephone Encounter (Signed)
[facility] to inquire as to clarification requested. Spoke with Vonna KotykJay.   Advised that [facility] only allows FMLA for 6 months, but provider gave 1 year. Also states that clarification is required for dates and parameters of office visits. MD advised that FMLA is for episodic flare ups of chronic back pain and cannot be scheduled. Vonna KotykJay states that something must be in section. Advised that dates can match the incapacity time. Advised to use routine F/U schedule with 1 additional PRN OV.  MD must sign and date all changes on initial form that was attached to clarification form.   MD to be made aware.

## 2017-11-10 ENCOUNTER — Encounter: Payer: Self-pay | Admitting: Family Medicine

## 2018-01-05 ENCOUNTER — Other Ambulatory Visit: Payer: Self-pay

## 2018-01-05 ENCOUNTER — Encounter: Payer: Self-pay | Admitting: Family Medicine

## 2018-01-05 ENCOUNTER — Ambulatory Visit (INDEPENDENT_AMBULATORY_CARE_PROVIDER_SITE_OTHER): Payer: 59 | Admitting: Family Medicine

## 2018-01-05 VITALS — BP 118/62 | HR 72 | Temp 97.8°F | Resp 12 | Ht 67.0 in | Wt 226.0 lb

## 2018-01-05 DIAGNOSIS — F331 Major depressive disorder, recurrent, moderate: Secondary | ICD-10-CM

## 2018-01-05 DIAGNOSIS — F411 Generalized anxiety disorder: Secondary | ICD-10-CM | POA: Diagnosis not present

## 2018-01-05 DIAGNOSIS — Z114 Encounter for screening for human immunodeficiency virus [HIV]: Secondary | ICD-10-CM

## 2018-01-05 DIAGNOSIS — G8929 Other chronic pain: Secondary | ICD-10-CM

## 2018-01-05 DIAGNOSIS — Z0000 Encounter for general adult medical examination without abnormal findings: Secondary | ICD-10-CM

## 2018-01-05 DIAGNOSIS — Z6835 Body mass index (BMI) 35.0-35.9, adult: Secondary | ICD-10-CM

## 2018-01-05 DIAGNOSIS — M25562 Pain in left knee: Secondary | ICD-10-CM

## 2018-01-05 DIAGNOSIS — E6609 Other obesity due to excess calories: Secondary | ICD-10-CM

## 2018-01-05 DIAGNOSIS — Z0184 Encounter for antibody response examination: Secondary | ICD-10-CM

## 2018-01-05 MED ORDER — PHENTERMINE HCL 37.5 MG PO TABS: 37.5000 mg | ORAL_TABLET | Freq: Every day | ORAL | 2 refills | Status: DC

## 2018-01-05 MED ORDER — SERTRALINE HCL 100 MG PO TABS: ORAL_TABLET | ORAL | 2 refills | Status: DC

## 2018-01-05 NOTE — Patient Instructions (Addendum)
Increase zoloft to 100mg   Wait about 2 weeks before starting the phentermine, can start with 1/2 tablet, then increase to 1 full tablet 2 weeks  Continue with exercise program Schedule with dentist Referral to therapy Get labs done fasting  F/U 2 MONTHS Dr. Tanya NonesPickard

## 2018-01-05 NOTE — Progress Notes (Signed)
Subjective:    Patient ID: Dominique Davis, female    DOB: Nov 16, 1985, 32 y.o.   MRN: [id number]  Patient presents for CPE (is not fasting)   Pt here for CPE, medications reviewed  PAP Smear UTD Immunizations UTD   Uses klonopin for anxiety but doesn't feel like helps    - restoril not helping with sleep   - drinks a lot of caffeiene during the day due to working and school She had to help her mother after surgery, found her fathers alcoholism is much worse and she doesn't know what to do. Wants to up her zoloft     Left knee pain- chronic, uses Knee sleeves, has seen orthopedics, no specific cause,recommended weight loss   Obesity- eats on the go, tries to some shakes and 1 meal and no change in meds   Currently on softball and team   Gets fatigued during the day, would like to try phentermine again  IUD in place      Has work CPE form, needs mmr titer, Works at [employer]:  [place]- denies fatigue, fever, weight loss,weakness, recent illness HEENT- denies eye drainage, change in vision, nasal discharge, CVS- denies chest pain, palpitations RESP- denies SOB, cough, wheeze ABD- denies N/V, change in stools, abd pain GU- denies dysuria, hematuria, dribbling, incontinence MSK- + joint pain, muscle aches, injury Neuro- denies headache, dizziness, syncope, seizure activity       Objective:    BP 118/62   Pulse 72   Temp 97.8 F (36.6 C) (Oral)   Resp 12   Ht '5\' 7"'$  (1.702 m)   Wt 226 lb (102.5 kg)   SpO2 99%   BMI 35.40 kg/m  GEN- NAD, alert and oriented x3 HEENT- PERRL, EOMI, non injected sclera, pink conjunctiva, MMM, oropharynx clear Neck- Supple, no thyromegaly CVS- RRR, no murmur RESP-CTAB ABD-NABS,soft,NT,ND Psych- normal affect and mood MSK- FROM bilat knees, normal ROB, no crepitus EXT- No edema Pulses- Radial, DP- 2+        Assessment & Plan:      Problem List Items Addressed This Visit      Unprioritized   Chronic knee  pain    Unchanged, weight loss will help Use prn NSAID Stays active      Relevant Medications   sertraline (ZOLOFT) 100 MG tablet   GAD (generalized anxiety disorder) - Primary    Increase zoloft to '100mg'$  D/c restoril, no helping sleep has tried other meds      Relevant Medications   sertraline (ZOLOFT) 100 MG tablet   MDD (major depressive disorder)   Relevant Medications   sertraline (ZOLOFT) 100 MG tablet   Obesity    Will try phentermine for both weight and daytime fatigue, see how this affects her evening routine once it wears off Also her weight causes more anxiety Discussed medication and SE, though she has been on before a few times, it does not cause her to have increased anxiety      Relevant Medications   phentermine (ADIPEX-P) 37.5 MG tablet    Other Visit Diagnoses    Routine general medical examination at a health care facility       CPE done, form to be completed, labs to be done at her work and faxed back   Relevant Orders   CBC with Differential/Platelet   Comprehensive metabolic panel   Lipid panel   Encounter for screening for HIV       Relevant  Orders   HIV antibody   Immunity status testing       Relevant Orders   Measles/Mumps/Rubella Immunity      Note: This dictation was prepared with Dragon dictation along with smaller phrase technology. Any transcriptional errors that result from this process are unintentional.

## 2018-01-07 ENCOUNTER — Encounter: Payer: Self-pay | Admitting: Family Medicine

## 2018-01-07 NOTE — Assessment & Plan Note (Signed)
Unchanged, weight loss will help Use prn NSAID Stays active

## 2018-01-07 NOTE — Assessment & Plan Note (Addendum)
Will try phentermine for both weight and daytime fatigue, see how this affects her evening routine once it wears off Also her weight causes more anxiety Discussed medication and SE, though she has been on before a few times, it does not cause her to have increased anxiety

## 2018-01-07 NOTE — Assessment & Plan Note (Signed)
Increase zoloft to 100mg  D/c restoril, no helping sleep has tried other meds

## 2018-01-09 ENCOUNTER — Encounter: Payer: Self-pay | Admitting: Family Medicine

## 2018-01-09 DIAGNOSIS — Z0184 Encounter for antibody response examination: Secondary | ICD-10-CM | POA: Diagnosis not present

## 2018-01-09 DIAGNOSIS — Z0000 Encounter for general adult medical examination without abnormal findings: Secondary | ICD-10-CM | POA: Diagnosis not present

## 2018-01-11 ENCOUNTER — Encounter: Payer: Self-pay | Admitting: Family Medicine

## 2018-01-12 MED ORDER — FLUCONAZOLE 150 MG PO TABS: 150.0000 mg | ORAL_TABLET | Freq: Once | ORAL | 0 refills | Status: AC

## 2018-01-16 ENCOUNTER — Encounter: Payer: Self-pay | Admitting: Family Medicine

## 2018-01-17 ENCOUNTER — Other Ambulatory Visit: Payer: Self-pay | Admitting: *Deleted

## 2018-01-17 ENCOUNTER — Encounter: Payer: Self-pay | Admitting: *Deleted

## 2018-01-17 DIAGNOSIS — Z13220 Encounter for screening for lipoid disorders: Secondary | ICD-10-CM

## 2018-01-17 DIAGNOSIS — Z0000 Encounter for general adult medical examination without abnormal findings: Secondary | ICD-10-CM

## 2018-01-18 DIAGNOSIS — Z0000 Encounter for general adult medical examination without abnormal findings: Secondary | ICD-10-CM | POA: Diagnosis not present

## 2018-01-18 DIAGNOSIS — Z13220 Encounter for screening for lipoid disorders: Secondary | ICD-10-CM | POA: Diagnosis not present

## 2018-01-19 LAB — CMP14+EGFR
ALBUMIN: 4.2 g/dL (ref 3.5–5.5)
ALT: 13 IU/L (ref 0–32)
AST: 13 IU/L (ref 0–40)
Albumin/Globulin Ratio: 1.8 (ref 1.2–2.2)
Alkaline Phosphatase: 44 IU/L (ref 39–117)
BUN / CREAT RATIO: 17 (ref 9–23)
BUN: 15 mg/dL (ref 6–20)
Bilirubin Total: 0.3 mg/dL (ref 0.0–1.2)
CALCIUM: 8.8 mg/dL (ref 8.7–10.2)
CO2: 22 mmol/L (ref 20–29)
Chloride: 105 mmol/L (ref 96–106)
Creatinine, Ser: 0.87 mg/dL (ref 0.57–1.00)
GFR, EST AFRICAN AMERICAN: 103 mL/min/{1.73_m2} (ref >59)
GFR, EST NON AFRICAN AMERICAN: 89 mL/min/{1.73_m2} (ref >59)
GLOBULIN, TOTAL: 2.3 g/dL (ref 1.5–4.5)
Glucose: 97 mg/dL (ref 65–99)
Potassium: 3.7 mmol/L (ref 3.5–5.2)
SODIUM: 141 mmol/L (ref 134–144)
TOTAL PROTEIN: 6.5 g/dL (ref 6.0–8.5)

## 2018-01-19 LAB — LIPID PANEL
CHOLESTEROL TOTAL: 118 mg/dL (ref 100–199)
Chol/HDL Ratio: 2.5 ratio (ref 0.0–4.4)
HDL: 48 mg/dL (ref >39)
LDL Calculated: 60 mg/dL (ref 0–99)
Triglycerides: 51 mg/dL (ref 0–149)
VLDL CHOLESTEROL CAL: 10 mg/dL (ref 5–40)

## 2018-02-03 ENCOUNTER — Encounter: Payer: Self-pay | Admitting: Family Medicine

## 2018-02-15 ENCOUNTER — Other Ambulatory Visit: Payer: Self-pay | Admitting: Family Medicine

## 2018-02-23 ENCOUNTER — Telehealth: Payer: Self-pay | Admitting: *Deleted

## 2018-02-23 ENCOUNTER — Encounter: Payer: Self-pay | Admitting: Family Medicine

## 2018-02-23 NOTE — Telephone Encounter (Signed)
Standard turn around time for forms

## 2018-02-23 NOTE — Telephone Encounter (Signed)
Received email from patient with [facility] Dominique Davis([employer]) FMLA forms.   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   Forms routed to provider.

## 2018-03-01 NOTE — Telephone Encounter (Signed)
Received completed FMLA from provider.   Simple form fee charged per provider. Patient contacted to pay fee.   Faxed to [employer].

## 2018-03-08 ENCOUNTER — Ambulatory Visit: Payer: 59 | Admitting: Family Medicine

## 2018-03-12 ENCOUNTER — Encounter: Payer: Self-pay | Admitting: Family Medicine

## 2018-03-12 ENCOUNTER — Ambulatory Visit: Payer: 59 | Admitting: Family Medicine

## 2018-03-12 VITALS — BP 138/70 | HR 78 | Temp 97.9°F | Resp 14 | Ht 67.0 in | Wt 213.0 lb

## 2018-03-12 DIAGNOSIS — F5101 Primary insomnia: Secondary | ICD-10-CM

## 2018-03-12 DIAGNOSIS — F411 Generalized anxiety disorder: Secondary | ICD-10-CM

## 2018-03-12 MED ORDER — ALPRAZOLAM 1 MG PO TABS: 1.0000 mg | ORAL_TABLET | Freq: Every evening | ORAL | 0 refills | Status: DC | PRN

## 2018-03-12 MED ORDER — LEVOCETIRIZINE DIHYDROCHLORIDE 5 MG PO TABS: 5.0000 mg | ORAL_TABLET | Freq: Every evening | ORAL | 5 refills | Status: DC

## 2018-03-12 NOTE — Progress Notes (Signed)
Subjective:    Patient ID: Dominique Davis, female    DOB: 1985/06/26, 32 y.o.   MRN: [id number]  HPI Patient saw my partner in August.  At that time she was reporting insomnia and her Zoloft was increased to 100 mg a day.  She was also started on phentermine for weight loss.  Patient has completed 2 months of phentermine.  She is using this to help "jump start her metabolism".  Today we discussed the risk of long-term use of phentermine including habituation and dependency.  I recommended discontinuation of this medication in 1 month to avoid this.  She does feel that the Zoloft is working better to control her anxiety.  However she is continuing to suffer from insomnia.  She is currently taking Benadryl to help her sleep however this leaves her feeling hung over in the morning.  Right now she works third shift from 930 until 6:30 in the morning.  She then goes home for an hour or so and has to go to school at 8:00.  She takes classes from 8 until 230.  She then tries to go home and sleep from 3 until 730 or 8 before she has to wake up to go to work.  The change in her schedule is keeping her from getting restorative sleep.  She is unable to fall asleep despite trying to get into a quiet dark room and using background noise.  She is no longer taking Klonopin.  She has never tried Ambien.  She has never tried hydroxyzine per her report.  She graduates in December.  However her grades are suffering because she is unable to study due to feeling extremely tired from not sleeping.  Past Medical History:  Diagnosis Date  . Anxiety   . Depression   . Dysmenorrhea   . Migraines   . Obesity   . Small intestinal bacterial overgrowth AUG 2015   CIP/FLAG X7 DAYS(2X 2015)   Past Surgical History:  Procedure Laterality Date  . BACTERIAL OVERGROWTH TEST N/A 01/03/2014   CANCELLED  . BACTERIAL OVERGROWTH TEST N/A 01/10/2014   0 PPM AT START, 65 PPM(75 MINS), 58 PPM(105 MINS)  . WISDOM TOOTH EXTRACTION     x2   Current Outpatient Medications on File Prior to Visit  Medication Sig Dispense Refill  . clonazePAM (KLONOPIN) 0.5 MG tablet Take 1 tablet (0.5 mg total) by mouth 2 (two) times daily as needed for anxiety. 20 tablet 1  . EPINEPHRINE 0.3 mg/0.3 mL IJ SOAJ injection INJECT 0.3 ML (0.3 MG) INTO THE MUSCLE ONCE AS NEEDED AS DIRECTED 1 Device 1  . fluticasone (FLONASE) 50 MCG/ACT nasal spray Place 2 sprays into both nostrils daily. 16 g 1  . levonorgestrel (MIRENA, 52 MG,) 20 MCG/24HR IUD 1 Intra Uterine Device (1 each total) by Intrauterine route once. 1 each 0  . phentermine (ADIPEX-P) 37.5 MG tablet Take 1 tablet (37.5 mg total) by mouth daily before breakfast. 30 tablet 2  . sertraline (ZOLOFT) 100 MG tablet TAKE 1 TABLET BY MOUTH  DAILY 90 tablet 2   No current facility-administered medications on file prior to visit.    No Known Allergies Social History   Socioeconomic History  . Marital status: Single    Spouse name: Not on file  . Number of children: Not on file  . Years of education: Not on file  . Highest education level: Not on file  Occupational History  . Not on file  Social Needs  .  Financial resource strain: Not on file  . Food insecurity:    Worry: Not on file    Inability: Not on file  . Transportation needs:    Medical: Not on file    Non-medical: Not on file  Tobacco Use  . Smoking status: Never Smoker  . Smokeless tobacco: Never Used  . Tobacco comment: Never smoked  Substance and Sexual Activity  . Alcohol use: Yes    Comment: rare  . Drug use: No  . Sexual activity: Yes    Partners: Male    Birth control/protection: Condom  Lifestyle  . Physical activity:    Days per week: Not on file    Minutes per session: Not on file  . Stress: Not on file  Relationships  . Social connections:    Talks on phone: Not on file    Gets together: Not on file    Attends religious service: Not on file    Active member of club or organization: Not on file     Attends meetings of clubs or organizations: Not on file    Relationship status: Not on file  . Intimate partner violence:    Fear of current or ex partner: Not on file    Emotionally abused: Not on file    Physically abused: Not on file    Forced sexual activity: Not on file  Other Topics Concern  . Not on file  Social History Narrative  . Not on file     Review of Systems  All other systems reviewed and are negative.      Objective:   Physical Exam  Constitutional: She appears well-developed and well-nourished.  Cardiovascular: Normal rate, regular rhythm and normal heart sounds.  No murmur heard. Pulmonary/Chest: Effort normal and breath sounds normal. No stridor. No respiratory distress. She has no wheezes.  Psychiatric: She has a normal mood and affect. Her behavior is normal. Judgment and thought content normal.  Vitals reviewed.         Assessment & Plan:  GAD (generalized anxiety disorder)  Primary insomnia  With regards to her anxiety, the patient feels that the Zoloft is working much better and she is happy with this.  She plans to discontinue phentermine in the next month.  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  I recommended using Xanax 1 mg p.o. prior to sleep.  This should last 3 to 4 hours and allow her to fall asleep but hopefully wear off by the time she is waking up to go to school and not leave a residual aftereffect.  We did discuss the risk of habituation and dependency.  I recommended using this medication only until December and then discontinuation after graduation.  I also recommended avoiding using Klonopin with this medication.  She is battling allergies at the present time and I recommended trying Xyzal 5 mg p.o. daily for allergies.

## 2018-03-27 ENCOUNTER — Encounter: Payer: Self-pay | Admitting: Family Medicine

## 2018-03-27 DIAGNOSIS — Z111 Encounter for screening for respiratory tuberculosis: Secondary | ICD-10-CM

## 2018-03-29 ENCOUNTER — Encounter: Payer: Self-pay | Admitting: Family Medicine

## 2018-03-29 DIAGNOSIS — Z111 Encounter for screening for respiratory tuberculosis: Secondary | ICD-10-CM | POA: Diagnosis not present

## 2018-04-04 ENCOUNTER — Encounter: Payer: Self-pay | Admitting: Family Medicine

## 2018-05-29 ENCOUNTER — Encounter: Payer: Self-pay | Admitting: Family Medicine

## 2018-05-31 MED ORDER — SERTRALINE HCL 100 MG PO TABS: ORAL_TABLET | ORAL | 0 refills | Status: DC

## 2018-05-31 MED ORDER — SERTRALINE HCL 100 MG PO TABS: ORAL_TABLET | ORAL | 2 refills | Status: DC

## 2018-07-12 ENCOUNTER — Encounter: Payer: Self-pay | Admitting: Family Medicine

## 2018-08-07 ENCOUNTER — Encounter: Payer: Self-pay | Admitting: Family Medicine

## 2018-08-15 ENCOUNTER — Ambulatory Visit: Payer: 59 | Admitting: Obstetrics and Gynecology

## 2018-08-15 ENCOUNTER — Other Ambulatory Visit: Payer: Self-pay

## 2018-08-15 ENCOUNTER — Encounter: Payer: Self-pay | Admitting: Obstetrics and Gynecology

## 2018-08-15 VITALS — BP 112/71 | HR 80 | Ht 67.0 in | Wt 223.2 lb

## 2018-08-15 DIAGNOSIS — Z30432 Encounter for removal of intrauterine contraceptive device: Secondary | ICD-10-CM

## 2018-08-15 DIAGNOSIS — Z3009 Encounter for other general counseling and advice on contraception: Secondary | ICD-10-CM

## 2018-08-15 MED ORDER — NORETHIN-ETH ESTRAD-FE BIPHAS 1 MG-10 MCG / 10 MCG PO TABS: 1.0000 | ORAL_TABLET | Freq: Every day | ORAL | 4 refills | Status: DC

## 2018-08-15 NOTE — Progress Notes (Signed)
Patient ID: Dominique Davis, female   DOB: April 23, 1986, 33 y.o.   MRN: [id number]  [facility] CLINIC PROCEDURE NOTE  Dominique Davis is a 33 y.o. G0P0000 here for Mirena IUD removal. No GYN concerns.  Last pap smear was normal  10/05/2015. Has had issue with IUD with bleeding and pain during intercourse. When periods happen she notes there is pain. She is unsure if she wants to get pregnant but is thinking of trying OCP for [facility]. Only reason why she got Mirena was due to school and not having to remember to take OCP. Wants low hormonal OCP. Was on lo loesterin & did well. Partner is present for moral support. Has taken Ibuprofen to help with discomfort.  IUD Removal  Patient was in the dorsal lithotomy position, normal external genitalia was noted.  A speculum was placed in the patient's vagina, normal discharge was noted, no lesions. The multiparous cervix was visualized, no lesions, no abnormal discharge;  and the cervix was swabbed with Betadine using scopettes. The strings of the IUD were grasped and pulled using ring forceps.  The IUD was successfully removed in its entirety.  Patient tolerated the procedure well.    Future contraception  OCP lo loesterin  By signing my name below, I, Arnette Norris, attest that this documentation has been prepared under the direction and in the presence of Tilda Burrow, MD. Electronically Signed: Arnette Norris Medical Scribe. 08/15/18. 3:47 PM.  I personally performed the services described in this documentation, which was SCRIBED in my presence. The recorded information has been reviewed and considered accurate. It has been edited as necessary during review. Tilda Burrow, MD

## 2018-08-16 ENCOUNTER — Telehealth: Payer: Self-pay | Admitting: *Deleted

## 2018-08-16 NOTE — Telephone Encounter (Signed)
Patient states her insurance has informed her that the Lo Loestrin is not covered by her insurance.  States Elmer FE, Larin FE, [place], Nathanial Rancher are covered. Would like for me to attempt PA on Lo Lo as she has used this before she had the IUD and it worked well for her.  Informed it can take a few days for a decision is made but I will let her know as soon as I find out.  Verbalized understanding.

## 2018-08-17 ENCOUNTER — Other Ambulatory Visit: Payer: Self-pay | Admitting: *Deleted

## 2018-08-17 MED ORDER — LEVOCETIRIZINE DIHYDROCHLORIDE 5 MG PO TABS: 5.0000 mg | ORAL_TABLET | Freq: Every evening | ORAL | 3 refills | Status: DC

## 2018-08-20 ENCOUNTER — Telehealth: Payer: Self-pay | Admitting: *Deleted

## 2018-08-20 NOTE — Telephone Encounter (Signed)
Lo Loestrin FE denied by insurance. Options available that are covered are as follows: Junel FE, Larin FE, Microgestin and [place]. Please advise.

## 2018-08-21 ENCOUNTER — Telehealth: Payer: Self-pay | Admitting: Obstetrics and Gynecology

## 2018-08-21 ENCOUNTER — Telehealth: Payer: Self-pay | Admitting: *Deleted

## 2018-08-21 ENCOUNTER — Other Ambulatory Visit: Payer: Self-pay | Admitting: Obstetrics and Gynecology

## 2018-08-21 ENCOUNTER — Encounter: Payer: Self-pay | Admitting: *Deleted

## 2018-08-21 MED ORDER — NORETHINDRONE ACET-ETHINYL EST 1-20 MG-MCG PO TABS: 1.0000 | ORAL_TABLET | Freq: Every day | ORAL | 11 refills | Status: DC

## 2018-08-21 NOTE — Telephone Encounter (Signed)
See note explaining switch to Microgestin.  Pt will need to be informed

## 2018-08-21 NOTE — Progress Notes (Signed)
Insurance does not at this time approve lo loestrin. Will go to a slightly higher dose OCP, microgestin, which is a 20 mcg pill, instead of Lo-loestrin, which is a 10 mcg pill with a small amount of estrogen even on withdrawal days. Please have pt call or come back if there is a problem with this pill.

## 2018-08-21 NOTE — Telephone Encounter (Signed)
Patient informed Dominique Davis was denied.  Message sent to provider to send in different medication.  Verbalized understanding.

## 2018-08-21 NOTE — Telephone Encounter (Signed)
Patient called stating that she has been working with Tish to have her Lo Loestrin approved with her insurance. Pt would like to know the status of this. Please contact pt

## 2018-08-27 ENCOUNTER — Other Ambulatory Visit: Payer: Self-pay | Admitting: *Deleted

## 2018-08-27 MED ORDER — NORETHINDRONE ACET-ETHINYL EST 1-20 MG-MCG PO TABS: 1.0000 | ORAL_TABLET | Freq: Every day | ORAL | 11 refills | Status: DC

## 2018-09-12 ENCOUNTER — Other Ambulatory Visit: Payer: 59 | Admitting: Family Medicine

## 2018-12-18 ENCOUNTER — Encounter: Payer: Self-pay | Admitting: Family Medicine

## 2018-12-18 ENCOUNTER — Telehealth: Payer: Self-pay | Admitting: *Deleted

## 2018-12-18 NOTE — Telephone Encounter (Signed)
Received forms from [employer]) for [employer].   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   Forms routed to provider.

## 2018-12-20 ENCOUNTER — Other Ambulatory Visit: Payer: Self-pay | Admitting: Family Medicine

## 2018-12-20 NOTE — Telephone Encounter (Signed)
Received completed  FMLA from provider.   Simple Form Fee to be charged per provider. Patient contacted to pay fee.   

## 2019-01-22 ENCOUNTER — Other Ambulatory Visit: Payer: Self-pay

## 2019-01-23 ENCOUNTER — Ambulatory Visit (INDEPENDENT_AMBULATORY_CARE_PROVIDER_SITE_OTHER): Payer: 59 | Admitting: Family Medicine

## 2019-01-23 ENCOUNTER — Encounter: Payer: Self-pay | Admitting: Family Medicine

## 2019-01-23 VITALS — BP 120/76 | HR 90 | Temp 98.8°F | Resp 14 | Ht 67.0 in | Wt 230.0 lb

## 2019-01-23 DIAGNOSIS — B379 Candidiasis, unspecified: Secondary | ICD-10-CM

## 2019-01-23 DIAGNOSIS — Z0001 Encounter for general adult medical examination with abnormal findings: Secondary | ICD-10-CM | POA: Diagnosis not present

## 2019-01-23 DIAGNOSIS — Z124 Encounter for screening for malignant neoplasm of cervix: Secondary | ICD-10-CM | POA: Diagnosis not present

## 2019-01-23 DIAGNOSIS — Z23 Encounter for immunization: Secondary | ICD-10-CM

## 2019-01-23 DIAGNOSIS — Z7189 Other specified counseling: Secondary | ICD-10-CM

## 2019-01-23 DIAGNOSIS — M791 Myalgia, unspecified site: Secondary | ICD-10-CM

## 2019-01-23 DIAGNOSIS — G8929 Other chronic pain: Secondary | ICD-10-CM

## 2019-01-23 DIAGNOSIS — E6609 Other obesity due to excess calories: Secondary | ICD-10-CM

## 2019-01-23 DIAGNOSIS — M545 Low back pain, unspecified: Secondary | ICD-10-CM

## 2019-01-23 DIAGNOSIS — Z0000 Encounter for general adult medical examination without abnormal findings: Secondary | ICD-10-CM

## 2019-01-23 DIAGNOSIS — Z6836 Body mass index (BMI) 36.0-36.9, adult: Secondary | ICD-10-CM

## 2019-01-23 DIAGNOSIS — F331 Major depressive disorder, recurrent, moderate: Secondary | ICD-10-CM | POA: Diagnosis not present

## 2019-01-23 LAB — WET PREP FOR TRICH, YEAST, CLUE

## 2019-01-23 MED ORDER — FLUCONAZOLE 150 MG PO TABS: ORAL_TABLET | ORAL | 2 refills | Status: DC

## 2019-01-23 NOTE — Assessment & Plan Note (Signed)
Continue zoloft, no change to meds Chronic insomnia has failed multiple meds

## 2019-01-23 NOTE — Assessment & Plan Note (Signed)
Obtain imaging reports from chiropracter Will check RF, ESR/CRP as well She is starting an anti-inflammatory diet with nutritionist

## 2019-01-23 NOTE — Patient Instructions (Addendum)
We will call lab reults  Flu shot given Get labs done fasting F/U 6 months

## 2019-01-23 NOTE — Progress Notes (Signed)
Subjective:    Patient ID: Dominique Davis, female    DOB: 1986/02/04, 33 y.o.   MRN: [id number]  Patient presents for Gynecologic Exam (gets labs at work- needs orders- has wellness forms) and Discuss Fibromyalgia    Pt here CPE        Due for PAP Smeaar      Taking OCP- menses regular on Microgestin pill        Working with nutritionist online, taking pre-biotics, continues to have chronic GI issues, had SIBO diagnosed in the past, has chronic joint pain and myalgias concerned about fibromyalgia as mother has this  has chronic back pain, still seeing chiropracter for this.   Feels if she works on diet and gets inflammation down she may improve    MDD/GAD- doing well with zoloft, finished school, now working full time       [employer] like she always has yeast infection, has thick white discharge itching occ odor  1 partner   Due for flu shot     Review Of Systems:  GEN- denies fatigue, fever, weight loss,weakness, recent illness HEENT- denies eye drainage, change in vision, nasal discharge, CVS- denies chest pain, palpitations RESP- denies SOB, cough, wheeze ABD- denies N/V, change in stools, abd pain GU- denies dysuria, hematuria, dribbling, incontinence MSK- denies joint pain, muscle aches, injury Neuro- denies headache, dizziness, syncope, seizure activity       Objective:    BP 120/76   Pulse 90   Temp 98.8 F (37.1 C) (Oral)   Resp 14   Ht 5' 7" (1.702 m)   Wt 230 lb (104.3 kg)   SpO2 96%   BMI 36.02 kg/m  GEN- NAD, alert and oriented x3 HEENT- PERRL, EOMI, non injected sclera, pink conjunctiva, MMM, oropharynx clear Neck- Supple, no thyromegaly Breast- normal symmetry, no nipple inversion,no nipple drainage, no nodules or lumps felt Nodes- no axillary nodes CVS- RRR, no murmur RESP-CTAB ABD-NABS,soft,NT,ND MSK- no joing swelling, FROm upper and lower ext GU- normal external genitalia, vaginal mucosa pink and moist, cervix visualized no growth, no blood  form os,+ thick curd like  discharge, no CMT, no ovarian masses, uterus normal size Psych- normal affect and mood EXT- No edema Pulses- Radial, DP- 2+   Fall/audit C screen neg     Assessment & Plan:      Problem List Items Addressed This Visit      Unprioritized   Chronic back pain    Obtain imaging reports from chiropracter Will check RF, ESR/CRP as well She is starting an anti-inflammatory diet with nutritionist       MDD (major depressive disorder)    Continue zoloft, no change to meds Chronic insomnia has failed multiple meds      Obesity   Relevant Orders   Hemoglobin A1c    Other Visit Diagnoses    Routine general medical examination at a health care facility    -  Primary   Relevant Orders   CBC with Differential/Platelet   Comprehensive metabolic panel   Lipid panel   Hemoglobin A1c   TSH   Cervical cancer screening       Relevant Orders   Pap IG w/ reflex to HPV when ASC-U   Yeast infection       diflucan given   Relevant Medications   fluconazole (DIFLUCAN) 150 MG tablet   Other Relevant Orders   WET PREP FOR TRICH, YEAST, CLUE (Completed)   Myalgia  Relevant Orders   Rheumatoid factor   Sedimentation Rate   C-reactive protein   Advice Given About Covid-19 Virus Infection       Work physical form required antibody testing, discussed the inability to accurately interpret these tests, pt aware    Relevant Orders   SARS-CoV-2 Antibody, IgM   Need for immunization against influenza       Relevant Orders   Flu Vaccine QUAD 36+ mos IM (Completed)      Note: This dictation was prepared with Dragon dictation along with smaller phrase technology. Any transcriptional errors that result from this process are unintentional.    

## 2019-01-24 LAB — PAP IG W/ RFLX HPV ASCU

## 2019-01-28 ENCOUNTER — Encounter: Payer: Self-pay | Admitting: Family Medicine

## 2019-01-30 ENCOUNTER — Telehealth: Payer: Self-pay | Admitting: Family Medicine

## 2019-01-30 DIAGNOSIS — R700 Elevated erythrocyte sedimentation rate: Secondary | ICD-10-CM

## 2019-01-30 DIAGNOSIS — M791 Myalgia, unspecified site: Secondary | ICD-10-CM

## 2019-01-30 DIAGNOSIS — R7989 Other specified abnormal findings of blood chemistry: Secondary | ICD-10-CM

## 2019-01-30 NOTE — Telephone Encounter (Signed)
Patient aware per MyChart.   

## 2019-01-30 NOTE — Telephone Encounter (Signed)
Call pt with lab results   CMET/CBC normal Lipids are mildly elevated- TG AND LDL, no meds needed just dietary changes , these were normal last year   TSH is abnormal, but non specific, she needs to have      Repeat TSH with FT3 and FT4 done     A1C is normal- no diabetes  Rheumatoid arthritis was negative, but she did have mild elevations in her other inflammatory markers, This does not point to one disease or another but indicates inflammation in the body  I want her to have a ANA test done, which is autoimmune screening test for things such as lupus to make sure nothing new has developed  I recommend she have all the repeat labs drawn in about 2 weeks   For thyroid, inflammation and repeat  If these are abnormal again we can discuss treatment or referral to specialist if needed

## 2019-02-01 LAB — SEDIMENTATION RATE: Sed Rate: 13 mm/hr (ref 0–32)

## 2019-02-01 LAB — ANA,IFA RA DIAG PNL W/RFLX TIT/PATN
ANA Titer 1: NEGATIVE
Cyclic Citrullin Peptide Ab: 16 units (ref 0–19)
Rhuematoid fact SerPl-aCnc: <10 IU/mL (ref 0.0–13.9)

## 2019-02-01 LAB — C-REACTIVE PROTEIN: CRP: 14 mg/L — ABNORMAL HIGH (ref 0–10)

## 2019-02-01 LAB — T3, FREE: T3, Free: 2.5 pg/mL (ref 2.0–4.4)

## 2019-02-01 LAB — TSH: TSH: 2.29 u[IU]/mL (ref 0.450–4.500)

## 2019-02-01 LAB — T4, FREE: Free T4: 1.09 ng/dL (ref 0.82–1.77)

## 2019-04-08 ENCOUNTER — Other Ambulatory Visit: Payer: Self-pay | Admitting: Family Medicine

## 2019-04-12 ENCOUNTER — Other Ambulatory Visit: Payer: Self-pay | Admitting: Obstetrics and Gynecology

## 2019-04-18 ENCOUNTER — Encounter: Payer: Self-pay | Admitting: Family Medicine

## 2019-04-18 DIAGNOSIS — K9041 Non-celiac gluten sensitivity: Secondary | ICD-10-CM

## 2019-04-18 DIAGNOSIS — Z789 Other specified health status: Secondary | ICD-10-CM

## 2019-04-22 ENCOUNTER — Encounter: Payer: Self-pay | Admitting: Gastroenterology

## 2019-04-25 ENCOUNTER — Other Ambulatory Visit: Payer: Self-pay

## 2019-04-25 DIAGNOSIS — Z20822 Contact with and (suspected) exposure to covid-19: Secondary | ICD-10-CM

## 2019-04-29 LAB — NOVEL CORONAVIRUS, NAA: SARS-CoV-2, NAA: NOT DETECTED

## 2019-05-08 ENCOUNTER — Telehealth: Payer: Self-pay | Admitting: Family Medicine

## 2019-05-08 ENCOUNTER — Other Ambulatory Visit: Payer: Self-pay | Admitting: *Deleted

## 2019-05-08 MED ORDER — METHOCARBAMOL 500 MG PO TABS: 500.0000 mg | ORAL_TABLET | Freq: Three times a day (TID) | ORAL | 0 refills | Status: DC | PRN

## 2019-05-08 NOTE — Telephone Encounter (Signed)
Patient is calling to see if she can get a muscle relaxer called in , says he has not been able to move the last couple of weeks, says that dr Buelah Manis has seen her for the problem but it has been a while  Please call and advise [phone]

## 2019-05-08 NOTE — Telephone Encounter (Signed)
Okay to refill robaxin 500mg  q 8 hrs prn #30

## 2019-05-08 NOTE — Telephone Encounter (Signed)
Prescription sent to pharmacy. .   Call placed to patient and patient made aware.  

## 2019-05-08 NOTE — Telephone Encounter (Signed)
Patient has been given Robaxin in past for her chronic back pain.  Ok to refill?

## 2019-05-10 ENCOUNTER — Ambulatory Visit: Payer: 59 | Admitting: Gastroenterology

## 2019-05-10 ENCOUNTER — Encounter: Payer: Self-pay | Admitting: Gastroenterology

## 2019-05-10 ENCOUNTER — Other Ambulatory Visit: Payer: Self-pay

## 2019-05-10 DIAGNOSIS — R1084 Generalized abdominal pain: Secondary | ICD-10-CM | POA: Diagnosis not present

## 2019-05-10 DIAGNOSIS — R109 Unspecified abdominal pain: Secondary | ICD-10-CM | POA: Insufficient documentation

## 2019-05-10 DIAGNOSIS — R140 Abdominal distension (gaseous): Secondary | ICD-10-CM

## 2019-05-10 DIAGNOSIS — K6389 Other specified diseases of intestine: Secondary | ICD-10-CM | POA: Insufficient documentation

## 2019-05-10 HISTORY — DX: Unspecified abdominal pain: R10.9

## 2019-05-10 HISTORY — DX: Abdominal distension (gaseous): R14.0

## 2019-05-10 MED ORDER — RIFAXIMIN 550 MG PO TABS: 550.0000 mg | ORAL_TABLET | Freq: Three times a day (TID) | ORAL | 0 refills | Status: AC

## 2019-05-10 NOTE — Progress Notes (Signed)
Primary Care Physician: Alycia Rossetti, MD  Primary Gastroenterologist:  Barney Drain, MD   Chief Complaint  Patient presents with   [facility]   Gas   Abdominal Pain    some, not as bad as in the past; has been gluten free several years    HPI: Dominique Davis is a 33 y.o. female here for follow-up.  Last seen in 2015.  History of small bowel bacterial overgrowth, positive hydrogen breath test in August 2015.  In March 2015 she had celiac testing which was negative except for gliadin IgG positive at 46.8.  August 20 15 HLA typing for celiac disease was negative.  Patient states she has been seeing a nutritionist with hopes of losing weight.  She gained 25 pounds over the past 1 year.  She states her nutritionist advised that she should consider following up with [place] with GI complaints, questionable SIBO.  Patient also reports they discussed possibility of her having Candida.  She has been gluten-free for about 5 years.  Her primary complaints today are abdominal bloating and gas.  Her bowel movements are better on probiotics.  Generally has 1 bowel movement per day.  She feels like her stools do not smell right.  Bowel movement smell like chemicals.  She denies any melena, rectal bleeding, heartburn, vomiting.  She has abdominal cramping and bloating.  She has been having some back pain and joint pain.  Saw her PCP and had labs to look for rheumatoid arthritis.  CRP was mildly elevated with normal sed rate.  Patient states that her PCP told her she had fibromyalgia.  Family history significant for mother having gastroparesis, "good issues", diverticulitis.  No family history of celiac, inflammatory bowel disease, colon cancer.  Patient rarely uses NSAIDs.  Has had back issues requiring her get to go to the chiropractor.  Recently started on Robaxin.  Menstrual cycles are very light.  States she had a IUD taken out earlier this year and has been on birth control.  Initially her  periods were normal with withdrawal bleeding from birth control monthly but with increased weight gain she is noted decreased menstrual bleeding.   Current Outpatient Medications  Medication Sig Dispense Refill   EPINEPHRINE 0.3 mg/0.3 mL IJ SOAJ injection INJECT 0.3 ML (0.3 MG) INTO THE MUSCLE ONCE AS NEEDED AS DIRECTED 1 Device 1   JUNEL 1/20 1-20 MG-MCG tablet TAKE 1 TABLET BY MOUTH  DAILY 63 tablet 3   levocetirizine (XYZAL) 5 MG tablet Take 1 tablet (5 mg total) by mouth every evening. 90 tablet 3   methocarbamol (ROBAXIN) 500 MG tablet Take 1 tablet (500 mg total) by mouth every 8 (eight) hours as needed for muscle spasms. 30 tablet 0   sertraline (ZOLOFT) 100 MG tablet TAKE 1 TABLET BY MOUTH  DAILY 30 tablet 11   No current facility-administered medications for this visit.     Allergies as of 05/10/2019   (No Known Allergies)   Past Medical History:  Diagnosis Date   Anxiety    Depression    Dysmenorrhea    Migraines    Obesity    Small intestinal bacterial overgrowth AUG 2015   CIP/FLAG X7 DAYS(2X 2015)   Past Surgical History:  Procedure Laterality Date   BACTERIAL OVERGROWTH TEST N/A 01/03/2014   CANCELLED   BACTERIAL OVERGROWTH TEST N/A 01/10/2014   0 PPM AT START, 65 PPM(75 MINS), 58 PPM(105 MINS)   WISDOM TOOTH EXTRACTION  x2   Family History  Problem Relation Age of Onset   Diabetes Mother        gastroparesis   Hypertension Mother    Anxiety disorder Mother    Mental illness Mother        Bipolar Disorder    Hypertension Father    Diabetes Maternal Grandmother    Hypertension Maternal Grandmother    Hypertension Paternal Grandmother    Inflammatory bowel disease Neg Hx    Colon cancer Neg Hx    Social History   Tobacco Use   Smoking status: Never Smoker   Smokeless tobacco: Never Used   Tobacco comment: Never smoked  Substance Use Topics   Alcohol use: Yes    Comment: rare   Drug use: No    ROS:  General:  Negative for anorexia, weight loss, fever, chills, fatigue, weakness. ENT: Negative for hoarseness, difficulty swallowing , nasal congestion. CV: Negative for chest pain, angina, palpitations, dyspnea on exertion, peripheral edema.  Respiratory: Negative for dyspnea at rest, dyspnea on exertion, cough, sputum, wheezing.  GI: See history of present illness. GU:  Negative for dysuria, hematuria, urinary incontinence, urinary frequency, nocturnal urination.  Endo: Negative for unusual weight change.    Physical Examination:   BP 140/87    Pulse 86    Temp (!) 96.9 F (36.1 C) (Temporal)    Ht _0  (1.702 m)    Wt 239 lb (108.4 kg)    LMP 04/15/2019    BMI 37.43 kg/m   General: Well-nourished, well-developed in no acute distress.  Eyes: No icterus. Mouth: Oropharyngeal mucosa moist and pink , no lesions erythema or exudate. Lungs: Clear to auscultation bilaterally.  Heart: Regular rate and rhythm, no murmurs rubs or gallops.  Abdomen: Bowel sounds are normal, nontender, nondistended, no hepatosplenomegaly or masses, no abdominal bruits or hernia , no rebound or guarding.   Extremities: No lower extremity edema. No clubbing or deformities. Neuro: Alert and oriented x 4   Skin: Warm and dry, no jaundice.   Psych: Alert and cooperative, normal mood and affect.  Labs:  Lab Results  Component Value Date   CRP 14 (H) 01/30/2019   Lab Results  Component Value Date   ESRSEDRATE 13 01/30/2019   Lab Results  Component Value Date   TSH 2.290 01/30/2019    Lab Results  Component Value Date   ALT 13 01/18/2018   AST 13 01/18/2018   ALKPHOS 44 01/18/2018   BILITOT 0.3 01/18/2018    Lab Results  Component Value Date   CREATININE 0.87 01/18/2018   BUN 15 01/18/2018   NA 141 01/18/2018   K 3.7 01/18/2018   CL 105 01/18/2018   CO2 22 01/18/2018    Imaging Studies: No results found.

## 2019-05-10 NOTE — Patient Instructions (Signed)
1. Start Xifaxan 550mg  three times per day for 14 days.  2. Fodmap elimination diet. See handout. 3. Continue to work with your nutritionist for weight loss purposes.  4. Call with progress report two weeks after completing your antibiotics. We would consider imaging if you continue to have symptoms of abdominal pain and bloating.    Low-FODMAP Eating Plan  FODMAPs (fermentable oligosaccharides, disaccharides, monosaccharides, and polyols) are sugars that are hard for some people to digest. A low-FODMAP eating plan may help some people who have bowel (intestinal) diseases to manage their symptoms. This meal plan can be complicated to follow. Work with a diet and nutrition specialist (dietitian) to make a low-FODMAP eating plan that is right for you. A dietitian can make sure that you get enough nutrition from this diet. What are tips for following this plan? Reading food labels  Check labels for hidden FODMAPs such as: ? High-fructose syrup. ? Honey. ? Agave. ? Natural fruit flavors. ? Onion or garlic powder.  Choose low-FODMAP foods that contain 3-4 grams of fiber per serving.  Check food labels for serving sizes. Eat only one serving at a time to make sure FODMAP levels stay low. Meal planning  Follow a low-FODMAP eating plan for up to 6 weeks, or as told by your health care provider or dietitian.  To follow the eating plan: 1. Eliminate high-FODMAP foods from your diet completely. 2. Gradually reintroduce high-FODMAP foods into your diet one at a time. Most people should wait a few days after introducing one high-FODMAP food before they introduce the next high-FODMAP food. Your dietitian can recommend how quickly you may reintroduce foods. 3. Keep a daily record of what you eat and drink, and make note of any symptoms that you have after eating. 4. Review your daily record with a dietitian regularly. Your dietitian can help you identify which foods you can eat and which foods you  should avoid. General tips  Drink enough fluid each day to keep your urine pale yellow.  Avoid processed foods. These often have added sugar and may be high in FODMAPs.  Avoid most dairy products, whole grains, and sweeteners.  Work with a dietitian to make sure you get enough fiber in your diet. Recommended foods Grains  Gluten-free grains, such as rice, oats, buckwheat, quinoa, corn, polenta, and millet. Gluten-free pasta, bread, or cereal. Rice noodles. Corn tortillas. Vegetables  Eggplant, zucchini, cucumber, peppers, green beans, Brussels sprouts, bean sprouts, lettuce, arugula, kale, Swiss chard, spinach, collard greens, bok choy, summer squash, potato, and tomato. Limited amounts of corn, carrot, and sweet potato. Green parts of scallions. Fruits  Bananas, oranges, lemons, limes, blueberries, raspberries, strawberries, grapes, cantaloupe, honeydew melon, kiwi, papaya, passion fruit, and pineapple. Limited amounts of dried cranberries, banana chips, and shredded coconut. Dairy  Lactose-free milk, yogurt, and kefir. Lactose-free cottage cheese and ice cream. Non-dairy milks, such as almond, coconut, hemp, and rice milk. Yogurts made of non-dairy milks. Limited amounts of goat cheese, brie, mozzarella, parmesan, swiss, and other hard cheeses. Meats and other protein foods  Unseasoned beef, pork, poultry, or fish. Eggs. Berniece Salines. Tofu (firm) and tempeh. Limited amounts of nuts and seeds, such as almonds, walnuts, [place] nuts, pecans, peanuts, pumpkin seeds, chia seeds, and sunflower seeds. Fats and oils  Butter-free spreads. Vegetable oils, such as olive, canola, and sunflower oil. Seasoning and other foods  Artificial sweeteners with names that do not end in "ol" such as aspartame, saccharine, and stevia. Maple syrup, white table sugar, raw sugar,  brown sugar, and molasses. Fresh basil, coriander, parsley, rosemary, and thyme. Beverages  Water and mineral water. Sugar-sweetened  soft drinks. Small amounts of orange juice or cranberry juice. Black and green tea. Most dry wines. Coffee. This may not be a complete list of low-FODMAP foods. Talk with your dietitian for more information. Foods to avoid Grains  Wheat, including kamut, durum, and semolina. Barley and bulgur. Couscous. Wheat-based cereals. Wheat noodles, bread, crackers, and pastries. Vegetables  Chicory root, artichoke, asparagus, cabbage, snow peas, sugar snap peas, mushrooms, and cauliflower. Onions, garlic, leeks, and the white part of scallions. Fruits  Fresh, dried, and juiced forms of apple, pear, watermelon, peach, plum, cherries, apricots, blackberries, boysenberries, figs, nectarines, and mango. Avocado. Dairy  Milk, yogurt, ice cream, and soft cheese. Cream and sour cream. Milk-based sauces. Custard. Meats and other protein foods  Fried or fatty meat. Sausage. Cashews and pistachios. Soybeans, baked beans, black beans, chickpeas, kidney beans, fava beans, navy beans, lentils, and split peas. Seasoning and other foods  Any sugar-free gum or candy. Foods that contain artificial sweeteners such as sorbitol, mannitol, isomalt, or xylitol. Foods that contain honey, high-fructose corn syrup, or agave. Bouillon, vegetable stock, beef stock, and chicken stock. Garlic and onion powder. Condiments made with onion, such as hummus, chutney, pickles, relish, salad dressing, and salsa. Tomato paste. Beverages  Chicory-based drinks. Coffee substitutes. Chamomile tea. Fennel tea. Sweet or fortified wines such as port or sherry. Diet soft drinks made with isomalt, mannitol, maltitol, sorbitol, or xylitol. Apple, pear, and mango juice. Juices with high-fructose corn syrup. This may not be a complete list of high-FODMAP foods. Talk with your dietitian to discuss what dietary choices are best for you.  Summary  A low-FODMAP eating plan is a short-term diet that eliminates FODMAPs from your diet to help ease  symptoms of certain bowel diseases.  The eating plan usually lasts up to 6 weeks. After that, high-FODMAP foods are restarted gradually, one at a time, so you can find out which may be causing symptoms.  A low-FODMAP eating plan can be complicated. It is best to work with a dietitian who has experience with this type of plan. This information is not intended to replace advice given to you by your health care provider. Make sure you discuss any questions you have with your health care provider. Document Released: 01/17/2017 Document Revised: 05/05/2017 Document Reviewed: 01/17/2017 Elsevier Patient Education  2020 [employer].

## 2019-05-14 NOTE — Progress Notes (Signed)
cc'ed to pcp °

## 2019-05-14 NOTE — Assessment & Plan Note (Signed)
Pleasant 33 year old female with history of small intestinal bacterial overgrowth in 2015 presenting for follow-up.  She has recurrent abdominal pain/bloating.  She appreciates her stool smelling somewhat different almost chemical like.  Denies any diarrhea.  She has been on probiotics.  Has been on a gluten-free diet for the past 5 years.  Notes gluten sensitivity although no evidence of celiac disease with negative TTG and normal HLA typing.  She may have recurrent small intestinal bacterial overgrowth.  Offered trial of Xifaxan 550 mg 3 times daily for 14 days.  She will call with a progress report in several weeks.  If persisting bloating and abdominal discomfort, consider further work-up, possibly imaging as the next step.  Also recommend FODMAP diet to try to determine if any particular foods are contributing to her symptoms.  Patient in agreement with plan.

## 2019-05-16 ENCOUNTER — Telehealth: Payer: Self-pay | Admitting: Gastroenterology

## 2019-05-16 NOTE — Telephone Encounter (Signed)
Patient called and stated her pharmacy was supposed to send [place] a prior auth request for her medication   Wanted to make sure we received it

## 2019-05-23 NOTE — Telephone Encounter (Signed)
PA was submitted through [email or web address]. waiting on an approval or denial.  

## 2019-05-24 NOTE — Telephone Encounter (Signed)
PA for Xifaxan has been approved through 08/21/2019. Pt notified of approval. Approval letter scanned in chart. Pt notified of approval.

## 2019-06-01 ENCOUNTER — Encounter: Payer: Self-pay | Admitting: Family Medicine

## 2019-06-03 ENCOUNTER — Ambulatory Visit: Payer: 59 | Attending: Internal Medicine

## 2019-06-03 ENCOUNTER — Other Ambulatory Visit: Payer: Self-pay

## 2019-06-03 DIAGNOSIS — Z20822 Contact with and (suspected) exposure to covid-19: Secondary | ICD-10-CM

## 2019-06-03 NOTE — Telephone Encounter (Signed)
Call placed to patient to inquire.   States that she has no known injury to eye, and it does not feel that there is anything in the eye irritating it at this time.   Reports that redness has decreased and is almost resolved. Reports that there is no pain, itching, or drainage noted. She is currently using lubricating drops to eye.   Denies any issues to opposite eye.   Advised to continue to monitor and if Sx persist or worsen to come in to office for eval.

## 2019-06-05 LAB — NOVEL CORONAVIRUS, NAA: SARS-CoV-2, NAA: NOT DETECTED

## 2019-06-20 ENCOUNTER — Encounter: Payer: Self-pay | Admitting: Family Medicine

## 2019-06-20 MED ORDER — NORETHINDRONE ACET-ETHINYL EST 1-20 MG-MCG PO TABS: 1.0000 | ORAL_TABLET | Freq: Every day | ORAL | 1 refills | Status: DC

## 2019-06-25 ENCOUNTER — Other Ambulatory Visit: Payer: Self-pay | Admitting: Family Medicine

## 2019-07-19 ENCOUNTER — Telehealth: Payer: Self-pay | Admitting: *Deleted

## 2019-07-19 ENCOUNTER — Encounter: Payer: Self-pay | Admitting: Family Medicine

## 2019-07-19 NOTE — Telephone Encounter (Signed)
Noted  

## 2019-07-19 NOTE — Telephone Encounter (Signed)
Received e-mail from patient with FMLA forms.   Call placed to patient for more information.   Job title: Engineer, drilling: run tests on speicimens, change reagent chemicals, fix machinery, lifting, walking/ standing  Hours of Work: M-F, 12:30am- 9am  Reason FMLA requested: Intermittent FMLA for chronic backpain  Verbalized that fee may be charged and is per provider prerogative.   Forms routed to provider.

## 2019-07-22 ENCOUNTER — Encounter: Payer: Self-pay | Admitting: Family Medicine

## 2019-07-22 NOTE — Telephone Encounter (Signed)
Received completed FMLA from provider.   Simple form fee to be charged per provider. Patient contacted to pay fee.  

## 2019-07-30 ENCOUNTER — Encounter: Payer: Self-pay | Admitting: Family Medicine

## 2019-08-07 ENCOUNTER — Other Ambulatory Visit: Payer: Self-pay

## 2019-08-07 ENCOUNTER — Encounter: Payer: Self-pay | Admitting: Family Medicine

## 2019-08-07 ENCOUNTER — Ambulatory Visit: Payer: 59 | Admitting: Family Medicine

## 2019-08-07 ENCOUNTER — Other Ambulatory Visit: Payer: Self-pay | Admitting: *Deleted

## 2019-08-07 VITALS — BP 126/82 | HR 62 | Temp 98.1°F | Resp 14 | Ht 67.0 in | Wt 237.0 lb

## 2019-08-07 DIAGNOSIS — F331 Major depressive disorder, recurrent, moderate: Secondary | ICD-10-CM

## 2019-08-07 DIAGNOSIS — F411 Generalized anxiety disorder: Secondary | ICD-10-CM | POA: Diagnosis not present

## 2019-08-07 DIAGNOSIS — F4310 Post-traumatic stress disorder, unspecified: Secondary | ICD-10-CM

## 2019-08-07 MED ORDER — ALPRAZOLAM 0.5 MG PO TABS: 0.5000 mg | ORAL_TABLET | Freq: Two times a day (BID) | ORAL | 1 refills | Status: DC | PRN

## 2019-08-07 MED ORDER — SERTRALINE HCL 100 MG PO TABS: 150.0000 mg | ORAL_TABLET | Freq: Every day | ORAL | 2 refills | Status: DC

## 2019-08-07 NOTE — Telephone Encounter (Signed)
Received call from [place] at [facility].   Reports that they no longer fill prescriptions for [employer] as [facility] does not pay well for prescriptions.   Reports that he cannot fill Xanax.   Requested to have prescription sent to [facility] instead.

## 2019-08-07 NOTE — Patient Instructions (Addendum)
Referral to therapy  Increase zoloft 150mg  one day  Xanax as needed  F/U 4 weeks telehealth
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
## 2019-09-04 ENCOUNTER — Other Ambulatory Visit: Payer: Self-pay

## 2019-09-04 ENCOUNTER — Encounter (HOSPITAL_COMMUNITY): Payer: Self-pay | Admitting: Psychiatry

## 2019-09-04 ENCOUNTER — Ambulatory Visit (INDEPENDENT_AMBULATORY_CARE_PROVIDER_SITE_OTHER): Payer: 59 | Admitting: Psychiatry

## 2019-09-04 DIAGNOSIS — F331 Major depressive disorder, recurrent, moderate: Secondary | ICD-10-CM | POA: Diagnosis not present

## 2019-09-04 DIAGNOSIS — F4310 Post-traumatic stress disorder, unspecified: Secondary | ICD-10-CM | POA: Diagnosis not present

## 2019-09-04 NOTE — Patient Instructions (Signed)
1. Continue sertraline 150 mg at night  2. Discontinue Xanax 3. Referral to therapy  4. Next appointment: 4/30 at 11:30

## 2019-09-09 ENCOUNTER — Encounter: Payer: Self-pay | Admitting: Family Medicine

## 2019-09-09 ENCOUNTER — Telehealth (INDEPENDENT_AMBULATORY_CARE_PROVIDER_SITE_OTHER): Payer: 59 | Admitting: Family Medicine

## 2019-09-09 DIAGNOSIS — F4310 Post-traumatic stress disorder, unspecified: Secondary | ICD-10-CM

## 2019-09-09 DIAGNOSIS — F411 Generalized anxiety disorder: Secondary | ICD-10-CM

## 2019-09-09 DIAGNOSIS — F331 Major depressive disorder, recurrent, moderate: Secondary | ICD-10-CM | POA: Diagnosis not present

## 2019-09-09 NOTE — Progress Notes (Signed)
Virtual Visit via Video Note  I connected with Dominique Davis on 09/09/19 at 12:30pm  by a video enabled telemedicine application and verified that I am speaking with the correct person using two identifiers.     Pt location: at home   Physician location:  In office, [facility], Milinda Antis MD     On call: patient and physician   I discussed the limitations of evaluation and management by telemedicine and the availability of in person appointments. The patient expressed understanding and agreed to proceed.  History of Present Illness: Telehealth visit to follow-up medications.  She was seen 4 weeks ago with increasing anxiety PTSD symptoms.  She was referred to psychiatry for therapy and medication management.  I did increase her Zoloft to 150 mg to help with symptoms and she was given short course of alprazolam twice daily as needed for anxiety attacks.  She did establish with psychiatry.  She had 1 appointment a couple weeks ago.  There is no change in her medications.  She does feel better than her last visit.  Her sleep is improved she is started exercising again and doing some crafting.  She did have a discussion with both of her parents specifically her mother about boundaries and needing to work on herself.  She feels like that has definitely helped.  Her mother has backed off on calling her so much with crises moments.  She does express that her mother was in the hospital last week and that caused an increase in the anxiety but she felt like she handled it quite well.  She has not had any side effects with the increased dose of medication.  But would like to have her therapy and psychiatry done in [place] like we discussed instead of [place]           Observations/Objective: No acute distress noted over the video normal speech well-groomed Psych normal affect and mood speech normal  Assessment and Plan: Generalized  anxiety disorder/major depressive  disorder/PTSD refer her to a psychiatrist in [place] this is close to where she works as well.  Continue Zoloft at 150 mg.  She is already decreased use of alprazolam but has it on hand in case she does have another anxiety attack.  Sleep is improving she is back to exercising which is also helped her mood.  We will follow-up in about 2 months unless she is regularly following with a psychiatrist.  Follow Up Instructions:    I discussed the assessment and treatment plan with the patient. The patient was provided an opportunity to ask questions and all were answered. The patient agreed with the plan and demonstrated an understanding of the instructions.   The patient was advised to call back or seek an in-person evaluation if the symptoms worsen or if the condition fails to improve as anticipated.  I provided 14 minutes of non-face-to-face time during this encounter. End Time 12:43pm  Milinda Antis, MD

## 2019-10-04 ENCOUNTER — Ambulatory Visit (HOSPITAL_COMMUNITY): Payer: 59 | Admitting: Psychiatry

## 2019-10-08 ENCOUNTER — Telehealth (INDEPENDENT_AMBULATORY_CARE_PROVIDER_SITE_OTHER): Payer: 59 | Admitting: Psychiatry

## 2019-10-08 ENCOUNTER — Encounter: Payer: Self-pay | Admitting: Psychiatry

## 2019-10-08 ENCOUNTER — Other Ambulatory Visit: Payer: Self-pay

## 2019-10-08 DIAGNOSIS — F4310 Post-traumatic stress disorder, unspecified: Secondary | ICD-10-CM

## 2019-10-08 DIAGNOSIS — F424 Excoriation (skin-picking) disorder: Secondary | ICD-10-CM | POA: Insufficient documentation

## 2019-10-08 DIAGNOSIS — F331 Major depressive disorder, recurrent, moderate: Secondary | ICD-10-CM

## 2019-10-08 MED ORDER — TRAZODONE HCL 100 MG PO TABS: 100.0000 mg | ORAL_TABLET | Freq: Every evening | ORAL | 1 refills | Status: DC | PRN
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
## 2019-10-08 NOTE — Patient Instructions (Signed)
Follow-up in clinic May 26 at 11:30 AM

## 2019-10-30 ENCOUNTER — Telehealth (INDEPENDENT_AMBULATORY_CARE_PROVIDER_SITE_OTHER): Payer: 59 | Admitting: Psychiatry

## 2019-10-30 ENCOUNTER — Encounter: Payer: Self-pay | Admitting: Psychiatry

## 2019-10-30 ENCOUNTER — Other Ambulatory Visit: Payer: Self-pay

## 2019-10-30 DIAGNOSIS — F4310 Post-traumatic stress disorder, unspecified: Secondary | ICD-10-CM

## 2019-10-30 DIAGNOSIS — F331 Major depressive disorder, recurrent, moderate: Secondary | ICD-10-CM | POA: Diagnosis not present

## 2019-10-30 DIAGNOSIS — F424 Excoriation (skin-picking) disorder: Secondary | ICD-10-CM

## 2019-10-30 MED ORDER — SERTRALINE HCL 100 MG PO TABS: 200.0000 mg | ORAL_TABLET | Freq: Every day | ORAL | 0 refills | Status: DC

## 2019-10-30 MED ORDER — HYDROXYZINE HCL 25 MG PO TABS: 25.0000 mg | ORAL_TABLET | Freq: Every day | ORAL | 0 refills | Status: DC | PRN

## 2019-10-30 NOTE — Progress Notes (Signed)
Provider Location : [facility] Patient Location : Home  Virtual Visit via Video Note  I connected with Dominique Davis on 10/30/19 at 11:30 AM EDT by a video enabled telemedicine application and verified that I am speaking with the correct person using two identifiers.   I discussed the limitations of evaluation and management by telemedicine and the availability of in person appointments. The patient expressed understanding and agreed to proceed.     I discussed the assessment and treatment plan with the patient. The patient was provided an opportunity to ask questions and all were answered. The patient agreed with the plan and demonstrated an understanding of the instructions.   The patient was advised to call back or seek an in-person evaluation if the symptoms worsen or if the condition fails to improve as anticipated.   BH MD OP Progress Note  10/30/2019 11:58 AM Dominique Davis  MRN:  [id number]  Chief Complaint:  Chief Complaint    Follow-up     HPI: Dominique Davis is a 34 year old female, has a history of PTSD, MDD, skin picking disorder, migraine headaches is employed, lives in [place] was evaluated by telemedicine today.  Patient today reports she is currently making progress with regards to her mood symptoms.  She however reports she continues to struggle with anxiety symptoms.  She reports she struggles with skin picking.  She reports recently she noticed a few spots where she had picked which got infected.  This has not happened in a long time.  Patient reports she is compliant on her medications.  Denies side effects.  She reports sleep is improved on the trazodone.  She is currently able to fall asleep and maintain sleep and this is an improvement for her.  Patient reports she was able to find a therapist and had one session.  Her therapist's name is Ms. Charlett Blake and she is located in [place].  She looks forward to having frequent sessions with her  therapist.  Patient denies any suicidality, homicidality or perceptual disturbances.  Patient denies any other concerns today.  Visit Diagnosis:    ICD-10-CM   1. PTSD (post-traumatic stress disorder)  F43.10 sertraline (ZOLOFT) 100 MG tablet  2. MDD (major depressive disorder), recurrent episode, moderate (HCC)  F33.1 sertraline (ZOLOFT) 100 MG tablet  3. 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  Past trials of Zoloft, trazodone, temazepam, melatonin, Xanax  Past Medical History:  Past Medical History:  Diagnosis Date  . Anxiety   . Depression   . Dysmenorrhea   . Migraines   . Obesity   . Small intestinal bacterial overgrowth AUG 2015   CIP/FLAG X7 DAYS(2X 2015)    Past Surgical History:  Procedure Laterality Date  . BACTERIAL OVERGROWTH TEST N/A 01/03/2014   CANCELLED  . BACTERIAL OVERGROWTH TEST N/A 01/10/2014   0 PPM AT START, 65 PPM(75 MINS), 58 PPM(105 MINS)  . WISDOM TOOTH EXTRACTION     x2    Family Psychiatric History: I have reviewed family psychiatric history from my progress note on 10/08/2019  Family History:  Family History  Problem Relation Age of Onset  . Diabetes Mother        gastroparesis  . Hypertension Mother   . Anxiety disorder Mother   . Mental illness Mother        Bipolar  Disorder   . Alcohol abuse Mother   . Depression Mother   . Drug abuse Mother   . Hypertension Father   . Alcohol abuse Father   . Diabetes Maternal Grandmother   . Hypertension Maternal Grandmother   . Hypertension Paternal Grandmother   . Alcohol abuse Paternal Grandfather   . Inflammatory bowel disease Neg Hx   . Colon cancer Neg Hx     Social History: I have reviewed social history from my progress note on 10/08/2019 Social History   Socioeconomic History  . Marital status: Single    Spouse  name: Not on file  . Number of children: Not on file  . Years of education: Not on file  . Highest education level: Not on file  Occupational History  . Not on file  Tobacco Use  . Smoking status: Never Smoker  . Smokeless tobacco: Never Used  . Tobacco comment: Never smoked  Substance and Sexual Activity  . Alcohol use: Yes    Comment: rare  . Drug use: No  . Sexual activity: Yes    Partners: Male    Birth control/protection: Condom  Other Topics Concern  . Not on file  Social History Narrative  . Not on file   Social Determinants of Health   Financial Resource Strain:   . Difficulty of Paying Living Expenses:   Food Insecurity:   . Worried About Programme researcher, broadcasting/film/video in the Last Year:   . Barista in the Last Year:   Transportation Needs:   . Freight forwarder (Medical):   Marland Kitchen Lack of Transportation (Non-Medical):   Physical Activity:   . Days of Exercise per Week:   . Minutes of Exercise per Session:   Stress:   . Feeling of Stress :   Social Connections:   . Frequency of Communication with Friends and Family:   . Frequency of Social Gatherings with Friends and Family:   . Attends Religious Services:   . Active Member of Clubs or Organizations:   . Attends Banker Meetings:   Marland Kitchen Marital Status:     Allergies:  Allergies  Allergen Reactions  . Black Pepper [Piper] Rash    Metabolic Disorder Labs: Lab Results  Component Value Date   HGBA1C 5.3 12/23/2015   MPG 105 03/11/2013   No results found for: PROLACTIN Lab Results  Component Value Date   CHOL 118 01/18/2018   TRIG 51 01/18/2018   HDL 48 01/18/2018   CHOLHDL 2.5 01/18/2018   VLDL 15 06/14/2013   LDLCALC 60 01/18/2018   LDLCALC 88 10/08/2015   Lab Results  Component Value Date   TSH 2.290 01/30/2019   TSH 4.150 10/08/2015    Therapeutic Level Labs: No results found for: LITHIUM No results found for: VALPROATE No components found for:  CBMZ  Current  Medications: Current Outpatient Medications  Medication Sig Dispense Refill  . ALPRAZolam (XANAX) 0.5 MG tablet Take 1 tablet (0.5 mg total) by mouth 2 (two) times daily as needed for anxiety. (Patient not taking: Reported on 10/08/2019) 20 tablet 1  . EPINEPHRINE 0.3 mg/0.3 mL IJ SOAJ injection INJECT 0.3 ML (0.3 MG) INTO THE MUSCLE ONCE AS NEEDED AS DIRECTED 1 Device 1  . hydrOXYzine (ATARAX/VISTARIL) 25 MG tablet Take 1-2 tablets (25-50 mg total) by mouth daily as needed. For severe anxiety only 180 tablet 0  . levocetirizine (XYZAL) 5 MG tablet TAKE 1 TABLET BY MOUTH IN  THE EVENING 90 tablet 3  .  methocarbamol (ROBAXIN) 500 MG tablet Take 1 tablet (500 mg total) by mouth every 8 (eight) hours as needed for muscle spasms. 30 tablet 0  . norethindrone-ethinyl estradiol (JUNEL 1/20) 1-20 MG-MCG tablet Take 1 tablet by mouth daily. 3 Package 1  . Omega-3 Fatty Acids (FISH OIL PO) Take by mouth daily.    . Probiotic Product (CULTURELLE PROBIOTICS PO) Take by mouth daily.    . sertraline (ZOLOFT) 100 MG tablet Take 2 tablets (200 mg total) by mouth daily. 180 tablet 0  . traZODone (DESYREL) 100 MG tablet Take 1-2 tablets (100-200 mg total) by mouth at bedtime as needed for sleep. 60 tablet 1   No current facility-administered medications for this visit.     Musculoskeletal: Strength & Muscle Tone: UTA Gait & Station: normal Patient leans: N/A  Psychiatric Specialty Exam: Review of Systems  Skin:       Skin excoriation due to skin picking  Psychiatric/Behavioral: The patient is nervous/anxious.   All other systems reviewed and are negative.   There were no vitals taken for this visit.There is no height or weight on file to calculate BMI.  General Appearance: Casual  Eye Contact:  Fair  Speech:  Clear and Coherent  Volume:  Normal  Mood:  Anxious  Affect:  Congruent  Thought Process:  Goal Directed and Descriptions of Associations: Intact  Orientation:  Full (Time, Place, and Person)   Thought Content: Logical   Suicidal Thoughts:  No  Homicidal Thoughts:  No  Memory:  Immediate;   Fair Recent;   Fair Remote;   Fair  Judgement:  Fair  Insight:  Fair  Psychomotor Activity:  Normal  Concentration:  Concentration: Fair and Attention Span: Fair  Recall:  [employer] of Knowledge: Fair  Language: Fair  Akathisia:  No  Handed:  Right  AIMS (if indicated): UTA  Assets:  Communication Skills Desire for Improvement Housing Social Support  ADL's:  Intact  Cognition: WNL  Sleep:  Fair   Screenings: GAD-7     Office Visit from 08/07/2019 in [facility]  Total GAD-7 Score  16    PHQ2-9     Office Visit from 08/07/2019 in [facility] Office Visit from 01/23/2019 in [facility] Office Visit from 03/12/2018 in [facility] Office Visit from 01/05/2018 in [facility] Office Visit from 07/07/2017 in [facility]  PHQ-2 Total Score  1  4  1  3  1   PHQ-9 Total Score  7  12  --  11  8       Assessment and Plan: Dominique Davis is a 34 year old female with a history of depression, PTSD, migraine headaches was evaluated by telemedicine today.  Patient is biologically predisposed given her history of trauma.  Patient with psychosocial stressors of relationship struggles with her mother who has mental health problems.  Patient however has good social support system.  Patient currently continues to struggle with skin picking and anxiety and will benefit from medication readjustment and psychotherapy sessions.  Plan as noted below.  Plan PTSD-improving Continue CBT.  She has established care with Ms. Grant Ruts at [facility]. Zoloft as prescribed Trazodone 100 to 200 mg p.o. nightly for sleep  MDD-improving Zoloft as prescribed Continue CBT  Skin picking disorder-unstable Increase Zoloft to 200 mg p.o. daily Continue Xanax as needed however she has been using it only  sporadically. Start hydroxyzine 25 to 50 mg p.o. daily  as needed for severe anxiety attacks/skin picking. Continue CBT for skin picking  Follow-up in clinic in 5 weeks or sooner if needed.  I have spent atleast 20 minutes non face to face with patient today. More than 50 % of the time was spent for preparing to see the patient ( e.g., review of test, records ),ordering medications and test ,psychoeducation and supportive psychotherapy and care coordination,as well as documenting clinical information in electronic health record. This note was generated in part or whole with voice recognition software. Voice recognition is usually quite accurate but there are transcription errors that can and very often do occur. I apologize for any typographical errors that were not detected and corrected.       Jomarie Longs, MD 10/30/2019, 11:58 AM

## 2019-11-11 ENCOUNTER — Encounter: Payer: Self-pay | Admitting: Family Medicine

## 2019-11-11 ENCOUNTER — Other Ambulatory Visit: Payer: Self-pay | Admitting: Psychiatry

## 2019-11-11 DIAGNOSIS — F424 Excoriation (skin-picking) disorder: Secondary | ICD-10-CM

## 2019-11-11 MED ORDER — NORETHINDRONE ACET-ETHINYL EST 1-20 MG-MCG PO TABS: 1.0000 | ORAL_TABLET | Freq: Every day | ORAL | 1 refills | Status: DC

## 2019-11-18 ENCOUNTER — Encounter: Payer: Self-pay | Admitting: Family Medicine

## 2019-12-11 ENCOUNTER — Ambulatory Visit: Payer: 59 | Admitting: Family Medicine

## 2019-12-13 ENCOUNTER — Telehealth (INDEPENDENT_AMBULATORY_CARE_PROVIDER_SITE_OTHER): Payer: 59 | Admitting: Psychiatry

## 2019-12-13 ENCOUNTER — Encounter: Payer: Self-pay | Admitting: Psychiatry

## 2019-12-13 ENCOUNTER — Other Ambulatory Visit: Payer: Self-pay

## 2019-12-13 DIAGNOSIS — F424 Excoriation (skin-picking) disorder: Secondary | ICD-10-CM | POA: Diagnosis not present

## 2019-12-13 DIAGNOSIS — F4310 Post-traumatic stress disorder, unspecified: Secondary | ICD-10-CM

## 2019-12-13 DIAGNOSIS — F331 Major depressive disorder, recurrent, moderate: Secondary | ICD-10-CM

## 2019-12-13 MED ORDER — HYDROXYZINE HCL 25 MG PO TABS: 25.0000 mg | ORAL_TABLET | Freq: Every day | ORAL | 2 refills | Status: DC | PRN

## 2019-12-13 MED ORDER — TRAZODONE HCL 100 MG PO TABS: 100.0000 mg | ORAL_TABLET | Freq: Every evening | ORAL | 2 refills | Status: DC | PRN

## 2019-12-13 NOTE — Progress Notes (Signed)
Provider Location : [facility] Patient Location : Home  Virtual Visit via Video Note  I connected with Dominique Davis on 12/13/19 at 10:20 AM EDT by a video enabled telemedicine application and verified that I am speaking with the correct person using two identifiers.   I discussed the limitations of evaluation and management by telemedicine and the availability of in person appointments. The patient expressed understanding and agreed to proceed.    I discussed the assessment and treatment plan with the patient. The patient was provided an opportunity to ask questions and all were answered. The patient agreed with the plan and demonstrated an understanding of the instructions.   The patient was advised to call back or seek an in-person evaluation if the symptoms worsen or if the condition fails to improve as anticipated.  BH MD OP Progress Note  12/13/2019 1:57 PM Dominique Davis  MRN:  [id number]  Chief Complaint:  Chief Complaint    Follow-up     HPI: Dominique Davis is a 34 year old female, has a history of PTSD, MDD, skin picking disorder, migraine headaches, is employed, lives in [place], was evaluated by telemedicine today.  Patient today reports she is currently making progress with regards to her anxiety and skin picking.  The increased dosage of Zoloft definitely helps.  She reports sleep as good as long as she takes the trazodone.  She continues to work at night and sleep during the day.  She however reports she has been having some trouble with her pharmacy filling the prescription.  She reports she continues to follow-up with her therapist Ms. Charlett Blake and reports therapy sessions as beneficial.  Patient denies any suicidality, homicidality or perceptual disturbances.  Patient denies any other concerns today. Visit Diagnosis:    ICD-10-CM   1. PTSD (post-traumatic stress disorder)  F43.10 traZODone (DESYREL) 100 MG tablet  2. MDD (major depressive disorder),  recurrent episode, moderate (HCC)  F33.1   3. Skin-picking disorder  F42.4 hydrOXYzine (ATARAX/VISTARIL) 25 MG tablet    Past Psychiatric History: I have reviewed past psychiatric history from my progress note on 10/08/2019.  Past trials of Zoloft, trazodone, temazepam, melatonin, Xanax  Past Medical History:  Past Medical History:  Diagnosis Date  . Anxiety   . Depression   . Dysmenorrhea   . Migraines   . Obesity   . Small intestinal bacterial overgrowth AUG 2015   CIP/FLAG X7 DAYS(2X 2015)    Past Surgical History:  Procedure Laterality Date  . BACTERIAL OVERGROWTH TEST N/A 01/03/2014   CANCELLED  . BACTERIAL OVERGROWTH TEST N/A 01/10/2014   0 PPM AT START, 65 PPM(75 MINS), 58 PPM(105 MINS)  . WISDOM TOOTH EXTRACTION     x2    Family Psychiatric History: I have reviewed family psychiatric history from my progress note on 10/08/2019  Family History:  Family History  Problem Relation Age of Onset  . Diabetes Mother        gastroparesis  . Hypertension Mother   . Anxiety disorder Mother   . Mental illness Mother        Bipolar Disorder   . Alcohol abuse Mother   . Depression Mother   . Drug abuse Mother   . Hypertension Father   . Alcohol abuse Father   . Diabetes Maternal Grandmother   . Hypertension Maternal Grandmother   . Hypertension Paternal Grandmother   . Alcohol abuse Paternal Grandfather   . Inflammatory bowel disease Neg Hx   . Colon cancer Neg  Hx     Social History: Reviewed social history from my progress note on 10/08/2019 Social History   Socioeconomic History  . Marital status: Media planner    Spouse name: Not on file  . Number of children: Not on file  . Years of education: Not on file  . Highest education level: Not on file  Occupational History  . Not on file  Tobacco Use  . Smoking status: Never Smoker  . Smokeless tobacco: Never Used  . Tobacco comment: Never smoked  Vaping Use  . Vaping Use: Never used  Substance and Sexual Activity   . Alcohol use: Yes    Comment: rare  . Drug use: No  . Sexual activity: Yes    Partners: Male    Birth control/protection: Condom  Other Topics Concern  . Not on file  Social History Narrative  . Not on file   Social Determinants of Health   Financial Resource Strain:   . Difficulty of Paying Living Expenses:   Food Insecurity:   . Worried About Programme researcher, broadcasting/film/video in the Last Year:   . Barista in the Last Year:   Transportation Needs:   . Freight forwarder (Medical):   Marland Kitchen Lack of Transportation (Non-Medical):   Physical Activity:   . Days of Exercise per Week:   . Minutes of Exercise per Session:   Stress:   . Feeling of Stress :   Social Connections:   . Frequency of Communication with Friends and Family:   . Frequency of Social Gatherings with Friends and Family:   . Attends Religious Services:   . Active Member of Clubs or Organizations:   . Attends Banker Meetings:   Marland Kitchen Marital Status:     Allergies:  Allergies  Allergen Reactions  . Black Pepper [Piper] Rash    Metabolic Disorder Labs: Lab Results  Component Value Date   HGBA1C 5.3 12/23/2015   MPG 105 03/11/2013   No results found for: PROLACTIN Lab Results  Component Value Date   CHOL 118 01/18/2018   TRIG 51 01/18/2018   HDL 48 01/18/2018   CHOLHDL 2.5 01/18/2018   VLDL 15 06/14/2013   LDLCALC 60 01/18/2018   LDLCALC 88 10/08/2015   Lab Results  Component Value Date   TSH 2.290 01/30/2019   TSH 4.150 10/08/2015    Therapeutic Level Labs: No results found for: LITHIUM No results found for: VALPROATE No components found for:  CBMZ  Current Medications: Current Outpatient Medications  Medication Sig Dispense Refill  . ALPRAZolam (XANAX) 0.5 MG tablet Take 1 tablet (0.5 mg total) by mouth 2 (two) times daily as needed for anxiety. (Patient not taking: Reported on 10/08/2019) 20 tablet 1  . EPINEPHRINE 0.3 mg/0.3 mL IJ SOAJ injection INJECT 0.3 ML (0.3 MG) INTO THE  MUSCLE ONCE AS NEEDED AS DIRECTED 1 Device 1  . hydrOXYzine (ATARAX/VISTARIL) 25 MG tablet Take 1-2 tablets (25-50 mg total) by mouth daily as needed. For severe anxiety only 60 tablet 2  . levocetirizine (XYZAL) 5 MG tablet TAKE 1 TABLET BY MOUTH IN  THE EVENING 90 tablet 3  . methocarbamol (ROBAXIN) 500 MG tablet Take 1 tablet (500 mg total) by mouth every 8 (eight) hours as needed for muscle spasms. 30 tablet 0  . norethindrone-ethinyl estradiol (JUNEL 1/20) 1-20 MG-MCG tablet Take 1 tablet by mouth daily. 1 Package 1  . Omega-3 Fatty Acids (FISH OIL PO) Take by mouth daily.    Marland Kitchen  Probiotic Product (CULTURELLE PROBIOTICS PO) Take by mouth daily.    . sertraline (ZOLOFT) 100 MG tablet Take 2 tablets (200 mg total) by mouth daily. 180 tablet 0  . traZODone (DESYREL) 100 MG tablet Take 1-2 tablets (100-200 mg total) by mouth at bedtime as needed for sleep. 60 tablet 2   No current facility-administered medications for this visit.     Musculoskeletal: Strength & Muscle Tone: UTA Gait & Station: normal Patient leans: N/A  Psychiatric Specialty Exam: Review of Systems  Psychiatric/Behavioral: The patient is nervous/anxious.   All other systems reviewed and are negative.   There were no vitals taken for this visit.There is no height or weight on file to calculate BMI.  General Appearance: Casual  Eye Contact:  Fair  Speech:  Normal Rate  Volume:  Normal  Mood:  Anxious improving  Affect:  Congruent  Thought Process:  Goal Directed and Descriptions of Associations: Intact  Orientation:  Full (Time, Place, and Person)  Thought Content: Logical   Suicidal Thoughts:  No  Homicidal Thoughts:  No  Memory:  Immediate;   Fair Recent;   Fair Remote;   Fair  Judgement:  Fair  Insight:  Fair  Psychomotor Activity:  Normal  Concentration:  Concentration: Fair and Attention Span: Fair  Recall:  [employer]  Fund of Knowledge: Fair  Language: Fair  Akathisia:  No  Handed:  Right  AIMS (if  indicated): UTA  Assets:  Communication Skills Desire for Improvement Housing Social Support  ADL's:  Intact  Cognition: WNL  Sleep:  Improving   Screenings: GAD-7     [facility]  Total GAD-7 Score 16    PHQ2-9     [facility]  PHQ-2 Total Score 1 4 1 3 1   PHQ-9 Total Score 7 12 -- 11 8       Assessment and Plan: Dominique BelfastVictoria G Davis is a 34 year old  female who has a history of depression, PTSD, migraine headaches was evaluated by telemedicine today.  Patient is biologically predisposed given her history of trauma.  Patient with psychosocial stressors of relationship struggles with her mother who has mental health problems.  Patient however continues to have good support system.  She is currently making progress with regards to her skin picking and sleep.  She will continue psychotherapy sessions which are beneficial.  Plan as noted below.  Plan PTSD-improving Continue CBT with Ms. Audrey Keaton[place] Zoloft 200 mg p.o. daily Trazodone 100 to 200 mg p.o. nightly for sleep  MDD-improving Zoloft as prescribed Continue CBT  Skin picking disorder-improving Zoloft 200 mg p.o. daily Hydroxyzine 25 to 50 mg p.o. daily as needed for severe anxiety attacks/skin picking Xanax as needed-she is only using it sporadically. Continue CBT for skin picking.  Follow-up in clinic in 8 weeks or sooner if needed.  I have spent atleast 20 minutes nonface to face with patient today. More than 50 % of the time was spent for preparing to see the patient ( e.g., review of test, records ), ordering medications and test ,psychoeducation and supportive  psychotherapy and care coordination,as well as documenting clinical information in electronic health record. This note was generated in part or whole with voice  recognition software. Voice recognition is usually quite accurate but there are transcription errors that can and very often do occur. I apologize for any typographical errors that were not detected and corrected.       Jomarie Longs, MD 12/13/2019, 1:57 PM

## 2020-01-07 ENCOUNTER — Encounter: Payer: Self-pay | Admitting: Family Medicine

## 2020-01-08 ENCOUNTER — Other Ambulatory Visit: Payer: Self-pay

## 2020-01-08 ENCOUNTER — Ambulatory Visit: Payer: 59 | Admitting: Family Medicine

## 2020-01-08 ENCOUNTER — Encounter: Payer: Self-pay | Admitting: Family Medicine

## 2020-01-08 VITALS — BP 122/66 | HR 88 | Temp 98.1°F | Resp 14 | Ht 67.0 in | Wt 240.0 lb

## 2020-01-08 DIAGNOSIS — J029 Acute pharyngitis, unspecified: Secondary | ICD-10-CM

## 2020-01-08 DIAGNOSIS — J069 Acute upper respiratory infection, unspecified: Secondary | ICD-10-CM

## 2020-01-08 MED ORDER — AMOXICILLIN 500 MG PO CAPS: 500.0000 mg | ORAL_CAPSULE | Freq: Two times a day (BID) | ORAL | 0 refills | Status: DC

## 2020-01-08 MED ORDER — GUAIFENESIN-CODEINE 100-10 MG/5ML PO SOLN: 5.0000 mL | Freq: Four times a day (QID) | ORAL | 0 refills | Status: DC | PRN

## 2020-01-08 NOTE — Progress Notes (Signed)
   Subjective:    Patient ID: Dominique Davis, female    DOB: [birth year], 34 y.o.   MRN: [id number]  Patient presents for Illness (x1 week- sore throat, HA, eye pain)   Pt here with sore throat, cough, nasal pressure for the past week. Started with mostly dry cough, itchy ears, sore throat, took allergy med but that did not help. Cough has minimal production but worse at night, causing sore throat, Also has swollen glands. COVID-19 vaccination done in March/April No known sick contacts No fever, no GI symptoms     Review Of Systems:  GEN- denies fatigue, fever, weight loss,weakness, recent illness HEENT- denies eye drainage, change in vision, nasal discharge, CVS- denies chest pain, palpitations RESP- denies SOB,+ cough, wheeze ABD- denies N/V, change in stools, abd pain GU- denies dysuria, hematuria, dribbling, incontinence MSK- denies joint pain, muscle aches, injury Neuro- denies headache, dizziness, syncope, seizure activity       Objective:    BP 122/66   Pulse 88   Temp 98.1 F (36.7 C) (Temporal)   Resp 14   Ht 5\' 7"  (1.702 m)   Wt 240 lb (108.9 kg)   SpO2 97%   BMI 37.59 kg/m  GEN- NAD, alert and oriented x3 HEENT- PERRL, EOMI, non injected sclera, pink conjunctiva, MMM, oropharynx mild injection, TM clear bilat no effusion, No  maxillary sinus tenderness, inflammed turbinates,  Nasal drainage  Neck- Supple, + submandibular LAD CVS- RRR, no murmur RESP-CTAB EXT- No edema Pulses- Radial 2+         Assessment & Plan:      Problem List Items Addressed This Visit    None    Visit Diagnoses    Pharyngitis, unspecified etiology    -  Primary   Relevant Orders   STREP GROUP A AG, W/REFLEX TO CULT   Upper respiratory tract infection, unspecified type       Treat for  URI with pharngytitis, throat culture sent, rapid neg, continue allergy med, robitssin codiene, covid vaccination UTD and cough mostly at night with throat irritation  amoxicillin given for  pharyngitis       Note: This dictation was prepared with Dragon dictation along with smaller phrase technology. Any transcriptional errors that result from this process are unintentional.

## 2020-01-08 NOTE — Patient Instructions (Signed)
Salt water gargle Continue tylenol or ibuprofen for sore throat Take antibiotics codiene cough medicine given F/U as needed

## 2020-01-10 LAB — STREP GROUP A AG, W/REFLEX TO CULT: Streptococcus, Group A Screen (Direct): NOT DETECTED

## 2020-01-10 LAB — CULTURE, GROUP A STREP
MICRO NUMBER:: 10786489
SPECIMEN QUALITY:: ADEQUATE

## 2020-01-11 ENCOUNTER — Other Ambulatory Visit: Payer: Self-pay | Admitting: Psychiatry

## 2020-01-11 DIAGNOSIS — F331 Major depressive disorder, recurrent, moderate: Secondary | ICD-10-CM

## 2020-01-11 DIAGNOSIS — F424 Excoriation (skin-picking) disorder: Secondary | ICD-10-CM

## 2020-01-11 DIAGNOSIS — F4310 Post-traumatic stress disorder, unspecified: Secondary | ICD-10-CM

## 2020-01-15 ENCOUNTER — Ambulatory Visit (INDEPENDENT_AMBULATORY_CARE_PROVIDER_SITE_OTHER): Payer: 59 | Admitting: Family Medicine

## 2020-01-15 ENCOUNTER — Other Ambulatory Visit: Payer: Self-pay

## 2020-01-15 ENCOUNTER — Encounter: Payer: Self-pay | Admitting: Family Medicine

## 2020-01-15 VITALS — BP 120/74 | HR 96 | Temp 98.1°F | Resp 16 | Ht 67.0 in | Wt 238.0 lb

## 2020-01-15 DIAGNOSIS — E6609 Other obesity due to excess calories: Secondary | ICD-10-CM | POA: Diagnosis not present

## 2020-01-15 DIAGNOSIS — F411 Generalized anxiety disorder: Secondary | ICD-10-CM

## 2020-01-15 DIAGNOSIS — Z6837 Body mass index (BMI) 37.0-37.9, adult: Secondary | ICD-10-CM

## 2020-01-15 DIAGNOSIS — F331 Major depressive disorder, recurrent, moderate: Secondary | ICD-10-CM | POA: Diagnosis not present

## 2020-01-15 DIAGNOSIS — Z0001 Encounter for general adult medical examination with abnormal findings: Secondary | ICD-10-CM

## 2020-01-15 DIAGNOSIS — Z0000 Encounter for general adult medical examination without abnormal findings: Secondary | ICD-10-CM

## 2020-01-15 NOTE — Progress Notes (Signed)
Subjective:    Patient ID: Dominique Davis, female    DOB: 03/17/1986, 34 y.o.   MRN: [id number]  Patient presents for Annual Exam (is fasting) Pt here for CPE    Meds reviwed, history reviewed   Followed by Psychiatry MDD/GAD, Zoloft was increased to 200 mg once a day she has seen improvement in her overall mood as well as her stress eating.  She still getting psychotherapy which helps with her OCD and they are concerned that she may also have underlying ADHD.    Contraception- taking OCP, last LMP end of July.  She is due for fasting labs today but she will get them at her job at [employer].  He was concerned about PCOS that the runs in her family.  She does have some hirsutism as well and difficulties with her weight.  She is currently on OCP for above and it is actually help control menstrual cycle better than anything else.  Obesity she has had difficulty losing weight for quite some time.  She was on phentermine in the past with the last go around 2019 there was no weight loss with it.  She is interested in trying something else to assist with weight loss.  She is trying to reduce her carbs in her sleep.  She is not exercising regularly   Sore throat has cleared up  Immunizations UTD- COVID-19 Done  Review Of Systems:  GEN- denies fatigue, fever, weight loss,weakness, recent illness HEENT- denies eye drainage, change in vision, nasal discharge, CVS- denies chest pain, palpitations RESP- denies SOB, cough, wheeze ABD- denies N/V, change in stools, abd pain GU- denies dysuria, hematuria, dribbling, incontinence MSK- denies joint pain, muscle aches, injury Neuro- denies headache, dizziness, syncope, seizure activity       Objective:    BP 120/74   Pulse 96   Temp 98.1 F (36.7 C) (Temporal)   Resp 16   Ht 5\' 7"  (1.702 m)   Wt 238 lb (108 kg)   SpO2 100%   BMI 37.28 kg/m  GEN- NAD, alert and oriented x3 HEENT- PERRL, EOMI, non injected sclera, pink conjunctiva, MMM,  oropharynx clear, TM clear no effusion  Neck- Supple, no thyromegaly CVS- RRR, no murmur RESP-CTAB ABD-NABS,soft,NT,ND Psych- normal affect and mood  EXT- No edema Pulses- Radial, DP- 2+   PHQ9 score 4       Assessment & Plan:      Problem List Items Addressed This Visit      Unprioritized   GAD (generalized anxiety disorder)   MDD (major depressive disorder)    Overall mood improved with meds and therapy  Defer to specialist      Obesity    Difficult to diagnosis PCOS  as menses regular and on hormones. She was seen by GYN in the past, but not diagnosed at that time Check for glucose intolerance with A1C I think she would benefit from newer GLP-1 therapy SHown how to use device in the office       Relevant Orders   Hemoglobin A1c    Other Visit Diagnoses    Routine general medical examination at a health care facility    -  Primary   CPE done, Hep C screening to be done, declines STD screening today, labs will be done at her work. Aware of COVID-19 Vaccine   Relevant Orders   TSH   Lipid panel   Hemoglobin A1c   CBC with Differential/Platelet   Comprehensive metabolic panel  Hepatitis C antibody      Note: This dictation was prepared with Dragon dictation along with smaller phrase technology. Any transcriptional errors that result from this process are unintentional.

## 2020-01-15 NOTE — Patient Instructions (Addendum)
I plan to start Ozempic/WEGOVY  once weekly  Other option is Saxenda once a day  F/U 2 months for weight/meds

## 2020-01-15 NOTE — Assessment & Plan Note (Signed)
Overall mood improved with meds and therapy  Defer to specialist

## 2020-01-15 NOTE — Assessment & Plan Note (Signed)
Difficult to diagnosis PCOS  as menses regular and on hormones. She was seen by GYN in the past, but not diagnosed at that time Check for glucose intolerance with A1C I think she would benefit from newer GLP-1 therapy SHown how to use device in the office

## 2020-01-16 ENCOUNTER — Telehealth: Payer: Self-pay | Admitting: *Deleted

## 2020-01-16 ENCOUNTER — Encounter: Payer: Self-pay | Admitting: Family Medicine

## 2020-01-16 NOTE — Telephone Encounter (Signed)
Received e-mail from patient with FMLA forms.   Call placed to patient for more information.   Job title: Engineer, drilling: run tests on speicimens, change reagent chemicals, fix machinery, lifting, walking/ standing  Hours of Work: M-F, 12:30am- 9am  Reason FMLA requested: Intermittent FMLA for chronic backpain- required Q 6 months  Verbalized that fee may be charged and is per provider prerogative.   Forms routed to provider.

## 2020-01-16 NOTE — Telephone Encounter (Signed)
Received completed FMLA from provider.   no charge per provider. Patient contacted to make aware.   Faxed to [employer].

## 2020-01-22 NOTE — Telephone Encounter (Signed)
I don't have a physical form.  CBC was normal Metabolic panel, she had mild elevation in Cr at 1.02,1.0 is the cut off Cholesterol panel- triglycerides have improved, LDL was still elevated at  132 Her calcium level was borderline low, she can take MVI with calcium in it Thyroid normal Hep C normal  A1C has gone up, previously 5.2%, now  5.8% in Prediabetes range  We discussed Ozempic Send 0.25mg  once a week for blood sugar and weight  F/U in office 2-3 months

## 2020-01-23 ENCOUNTER — Telehealth: Payer: Self-pay | Admitting: *Deleted

## 2020-01-23 ENCOUNTER — Other Ambulatory Visit: Payer: Self-pay | Admitting: *Deleted

## 2020-01-23 MED ORDER — OZEMPIC (0.25 OR 0.5 MG/DOSE) 2 MG/1.5ML ~~LOC~~ SOPN: 0.2500 mg | PEN_INJECTOR | SUBCUTANEOUS | 3 refills | Status: DC

## 2020-01-23 NOTE — Telephone Encounter (Signed)
Received PA determination.  PA [id number] approved 01/23/2020- 01/22/2021.  Pharmacy made aware.

## 2020-01-23 NOTE — Telephone Encounter (Signed)
Received request from pharmacy for PA on Ozempic.  PA submitted.   Dx: R73.09- elevated glucose.   [facility] is reviewing your PA request. Typically an electronic response will be received within 72 hours.

## 2020-01-23 NOTE — Telephone Encounter (Signed)
Patient aware per MyChart.   Patient had sent copy of form in prior message. Form completed and placed on desk for signature.   Of note, the form has a space for: Additional Test SARS-CoV-2 Semi- Quant Total Ab [phone])  Please advise.

## 2020-01-23 NOTE — Telephone Encounter (Signed)
Received call from pharmacy.   Reports that they are unable to fill prescription at this time. States that patient would like to have prescription sent to [facility] on [address] [place].   Prescription sent to pharmacy.

## 2020-02-28 ENCOUNTER — Other Ambulatory Visit: Payer: Self-pay

## 2020-02-28 ENCOUNTER — Telehealth (INDEPENDENT_AMBULATORY_CARE_PROVIDER_SITE_OTHER): Payer: 59 | Admitting: Psychiatry

## 2020-02-28 ENCOUNTER — Encounter: Payer: Self-pay | Admitting: Psychiatry

## 2020-02-28 DIAGNOSIS — F4310 Post-traumatic stress disorder, unspecified: Secondary | ICD-10-CM

## 2020-02-28 DIAGNOSIS — F424 Excoriation (skin-picking) disorder: Secondary | ICD-10-CM

## 2020-02-28 DIAGNOSIS — F331 Major depressive disorder, recurrent, moderate: Secondary | ICD-10-CM | POA: Diagnosis not present

## 2020-02-28 MED ORDER — TRAZODONE HCL 100 MG PO TABS: 100.0000 mg | ORAL_TABLET | Freq: Every evening | ORAL | 2 refills | Status: DC | PRN

## 2020-02-28 NOTE — Progress Notes (Signed)
Provider Location : [facility] Patient Location : Car  Participants: Patient , Provider  Virtual Visit via Video Note  I connected with Dominique Davis on 02/28/20 at 10:00 AM EDT by a video enabled telemedicine application and verified that I am speaking with the correct person using two identifiers.   I discussed the limitations of evaluation and management by telemedicine and the availability of in person appointments. The patient expressed understanding and agreed to proceed.    I discussed the assessment and treatment plan with the patient. The patient was provided an opportunity to ask questions and all were answered. The patient agreed with the plan and demonstrated an understanding of the instructions.   The patient was advised to call back or seek an in-person evaluation if the symptoms worsen or if the condition fails to improve as anticipated.   BH MD OP Progress Note  02/28/2020 1:37 PM NHUNG DANKO  MRN:  [id number]  Chief Complaint:  Chief Complaint    Follow-up     HPI: Dominique Davis is a 34 year old female, has a history of PTSD, MDD, skin picking disorder, migraine headaches is employed, lives in [place] was evaluated by telemedicine today.  Patient today reports she is currently making progress with regards to her anxiety.  She however does report skin picking on and off.  She reports however that has been chronic and she goes through episodes of flareup.  She reports it could be because she has been talking about certain problems in therapy which could be making her skin picking problems worse.  She reports she is not interested in a medication change for the same.  She reports she does struggle with sleep on a regular basis.  She works night shift and sleep during the day.  She goes to bed at 2 PM and wakes up at around 11 PM.  She reports the trazodone does not seem to be beneficial at this dosage.  She continues to follow-up with her therapist Ms. Charlett Blake and reports therapy sessions are helpful.  She denies any suicidality, homicidality or perceptual disturbances.  Patient denies any other concerns today.  Visit Diagnosis:    ICD-10-CM   1. PTSD (post-traumatic stress disorder)  F43.10 traZODone (DESYREL) 100 MG tablet  2. MDD (major depressive disorder), recurrent episode, moderate (HCC)  F33.1   3. 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  Past trials of Zoloft, trazodone, temazepam, melatonin, Xanax  Past Medical History:  Past Medical History:  Diagnosis Date  . Anxiety   . Depression   . Dysmenorrhea   . Migraines   . Obesity   . Small intestinal bacterial overgrowth AUG 2015   CIP/FLAG X7 DAYS(2X 2015)    Past Surgical History:  Procedure Laterality Date  . BACTERIAL OVERGROWTH TEST N/A 01/03/2014   CANCELLED  . BACTERIAL OVERGROWTH TEST N/A 01/10/2014   0 PPM AT START, 65 PPM(75 MINS), 58 PPM(105 MINS)  . WISDOM TOOTH EXTRACTION     x2    Family Psychiatric History: I have reviewed family psychiatric history from my progress note on 10/08/2019  Family History:  Family History  Problem Relation Age of Onset  . Diabetes Mother        gastroparesis  . Hypertension Mother   . Anxiety disorder Mother   . Mental illness Mother        Bipolar Disorder   .  Alcohol abuse Mother   . Depression Mother   . Drug abuse Mother   . Hypertension Father   . Alcohol abuse Father   . Diabetes Maternal Grandmother   . Hypertension Maternal Grandmother   . Hypertension Paternal Grandmother   . Alcohol abuse Paternal Grandfather   . Inflammatory bowel disease Neg Hx   . Colon cancer Neg Hx     Social History: I have reviewed social history from my progress note on 10/08/2019 Social History   Socioeconomic History  . Marital status: Media plannerDomestic Partner    Spouse name: Not on file  . Number of children: Not on file  . Years of  education: Not on file  . Highest education level: Not on file  Occupational History  . Not on file  Tobacco Use  . Smoking status: Never Smoker  . Smokeless tobacco: Never Used  . Tobacco comment: Never smoked  Vaping Use  . Vaping Use: Never used  Substance and Sexual Activity  . Alcohol use: Yes    Comment: rare  . Drug use: No  . Sexual activity: Yes    Partners: Male    Birth control/protection: Condom  Other Topics Concern  . Not on file  Social History Narrative  . Not on file   Social Determinants of Health   Financial Resource Strain:   . Difficulty of Paying Living Expenses: Not on file  Food Insecurity:   . Worried About Programme researcher, broadcasting/film/videounning Out of Food in the Last Year: Not on file  . Ran Out of Food in the Last Year: Not on file  Transportation Needs:   . Lack of Transportation (Medical): Not on file  . Lack of Transportation (Non-Medical): Not on file  Physical Activity:   . Days of Exercise per Week: Not on file  . Minutes of Exercise per Session: Not on file  Stress:   . Feeling of Stress : Not on file  Social Connections:   . Frequency of Communication with Friends and Family: Not on file  . Frequency of Social Gatherings with Friends and Family: Not on file  . Attends Religious Services: Not on file  . Active Member of Clubs or Organizations: Not on file  . Attends BankerClub or Organization Meetings: Not on file  . Marital Status: Not on file    Allergies:  Allergies  Allergen Reactions  . Black Pepper [[place]] Rash    Metabolic Disorder Labs: Lab Results  Component Value Date   HGBA1C 5.3 12/23/2015   MPG 105 03/11/2013   No results found for: PROLACTIN Lab Results  Component Value Date   CHOL 118 01/18/2018   TRIG 51 01/18/2018   HDL 48 01/18/2018   CHOLHDL 2.5 01/18/2018   VLDL 15 06/14/2013   LDLCALC 60 01/18/2018   LDLCALC 88 10/08/2015   Lab Results  Component Value Date   TSH 2.290 01/30/2019   TSH 4.150 10/08/2015    Therapeutic Level  Labs: No results found for: LITHIUM No results found for: VALPROATE No components found for:  CBMZ  Current Medications: Current Outpatient Medications  Medication Sig Dispense Refill  . ALPRAZolam (XANAX) 0.5 MG tablet Take 1 tablet (0.5 mg total) by mouth 2 (two) times daily as needed for anxiety. 20 tablet 1  . EPINEPHRINE 0.3 mg/0.3 mL IJ SOAJ injection INJECT 0.3 ML (0.3 MG) INTO THE MUSCLE ONCE AS NEEDED AS DIRECTED 1 Device 1  . hydrOXYzine (ATARAX/VISTARIL) 25 MG tablet TAKE 1 TO 2 TABLETS BY  MOUTH DAILY  AS NEEDED. FOR  SEVERE ANXIETY ONLY 180 tablet 1  . levocetirizine (XYZAL) 5 MG tablet TAKE 1 TABLET BY MOUTH IN  THE EVENING 90 tablet 3  . methocarbamol (ROBAXIN) 500 MG tablet Take 1 tablet (500 mg total) by mouth every 8 (eight) hours as needed for muscle spasms. 30 tablet 0  . norethindrone-ethinyl estradiol (JUNEL 1/20) 1-20 MG-MCG tablet Take 1 tablet by mouth daily. 1 Package 1  . Omega-3 Fatty Acids (FISH OIL PO) Take by mouth daily.    . Probiotic Product (CULTURELLE PROBIOTICS PO) Take by mouth daily.    . Semaglutide,0.25 or 0.5MG /DOS, (OZEMPIC, 0.25 OR 0.5 MG/DOSE,) 2 MG/1.5ML SOPN Inject 0.1875 mLs (0.25 mg total) into the skin once a week. 3 mL 3  . sertraline (ZOLOFT) 100 MG tablet TAKE 2 TABLETS BY MOUTH  DAILY 180 tablet 3  . traZODone (DESYREL) 100 MG tablet Take 1-2 tablets (100-200 mg total) by mouth at bedtime as needed for sleep. 60 tablet 2   No current facility-administered medications for this visit.     Musculoskeletal: Strength & Muscle Tone: UTA Gait & Station: normal Patient leans: N/A  Psychiatric Specialty Exam: Review of Systems  Psychiatric/Behavioral: Positive for sleep disturbance.       Skin picking  All other systems reviewed and are negative.   There were no vitals taken for this visit.There is no height or weight on file to calculate BMI.  General Appearance: Casual  Eye Contact:  Fair  Speech:  Clear and Coherent  Volume:   Normal  Mood:  Euthymic  Affect:  Congruent  Thought Process:  Goal Directed and Descriptions of Associations: Intact  Orientation:  Full (Time, Place, and Person)  Thought Content: Logical   Suicidal Thoughts:  No  Homicidal Thoughts:  No  Memory:  Immediate;   Fair Recent;   Fair Remote;   Fair  Judgement:  Fair  Insight:  Fair  Psychomotor Activity:  Normal  Concentration:  Concentration: Fair and Attention Span: Fair  Recall:  [employer] of Knowledge: Fair  Language: Fair  Akathisia:  No  Handed:  Right  AIMS (if indicated): UTA  Assets:  Communication Skills Desire for Improvement Housing Social Support  ADL's:  Intact  Cognition: WNL  Sleep:  Poor   Screenings: GAD-7     Office Visit from 08/07/2019 in [facility]  Total GAD-7 Score 16    PHQ2-9     Office Visit from 01/15/2020 in [facility] Office Visit from 08/07/2019 in [facility] Office Visit from 01/23/2019 in [facility] Office Visit from 03/12/2018 in [facility] Office Visit from 01/05/2018 in [facility]  PHQ-2 Total Score 0 1 4 1 3   PHQ-9 Total Score 4 7 12  -- 11       Assessment and Plan: Dominique Davis is a 34 year old female who has a history of depression, PTSD, migraine headaches was evaluated by telemedicine today.  She is biologically predisposed given her history of trauma.  Patient with psychosocial stressors of relationship struggles with her mother who has mental health problems.  Patient does struggle with skin picking and sleep issues.  Discussed plan as noted below.  Plan PTSD-improving Continue CBT with Ms. Dominique Davis at [facility]. Zoloft 200 mg p.o. daily Trazodone 100 to 200 mg p.o. nightly for sleep. However since patient continues to struggle with sleep, discussed with patient to add melatonin 3 to 5 mg p.o.  at bedtime.  She will work on sleep  hygiene.  MDD-improving Zoloft as prescribed Continue CBT  Skin picking disorder-improving Zoloft 200 mg p.o. daily Hydroxyzine 25 to 50 mg p.o. daily as needed for severe anxiety attacks/skin picking. Xanax as needed-he uses it only sporadically Continue CBT  Follow-up in clinic in 8 weeks or sooner if needed.  I have spent atleast 20 minutes non face to face with patient today. More than 50 % of the time was spent for preparing to see the patient ( e.g., review of test, records ),  ordering medications and test ,psychoeducation and supportive psychotherapy and care coordination,as well as documenting clinical information in electronic health record. This note was generated in part or whole with voice recognition software. Voice recognition is usually quite accurate but there are transcription errors that can and very often do occur. I apologize for any typographical errors that were not detected and corrected.       Jomarie Longs, MD 02/28/2020, 1:37 PM

## 2020-03-18 ENCOUNTER — Other Ambulatory Visit: Payer: Self-pay

## 2020-03-18 ENCOUNTER — Ambulatory Visit: Payer: 59 | Admitting: Family Medicine

## 2020-03-18 ENCOUNTER — Encounter: Payer: Self-pay | Admitting: Family Medicine

## 2020-03-18 VITALS — BP 118/62 | HR 74 | Temp 97.9°F | Resp 14 | Ht 67.0 in | Wt 223.0 lb

## 2020-03-18 DIAGNOSIS — E785 Hyperlipidemia, unspecified: Secondary | ICD-10-CM | POA: Diagnosis not present

## 2020-03-18 DIAGNOSIS — H04123 Dry eye syndrome of bilateral lacrimal glands: Secondary | ICD-10-CM | POA: Diagnosis not present

## 2020-03-18 DIAGNOSIS — R7303 Prediabetes: Secondary | ICD-10-CM

## 2020-03-18 DIAGNOSIS — Z23 Encounter for immunization: Secondary | ICD-10-CM | POA: Diagnosis not present

## 2020-03-18 DIAGNOSIS — T887XXA Unspecified adverse effect of drug or medicament, initial encounter: Secondary | ICD-10-CM

## 2020-03-18 DIAGNOSIS — E669 Obesity, unspecified: Secondary | ICD-10-CM

## 2020-03-18 DIAGNOSIS — T8089XA Other complications following infusion, transfusion and therapeutic injection, initial encounter: Secondary | ICD-10-CM

## 2020-03-18 MED ORDER — NORETHINDRONE ACET-ETHINYL EST 1-20 MG-MCG PO TABS
1.0000 | ORAL_TABLET | Freq: Every day | ORAL | 2 refills | Status: DC
Start: 2020-03-18 — End: 2020-06-23

## 2020-03-18 MED ORDER — DIPHENHYDRAMINE HCL 25 MG PO CAPS
25.0000 mg | ORAL_CAPSULE | Freq: Once | ORAL | Status: AC
  Administered 2020-03-18: 25 mg via ORAL

## 2020-03-18 NOTE — Progress Notes (Signed)
   Subjective:    Patient ID: Dominique Davis, female    DOB: May 19, 1986, 34 y.o.   MRN: [id number]  Patient presents for Follow-up (Pt here for routine follow up - she said that she is doing well with her new medication. She said that if you have time, she would like you to look at her right eye. She said that it is hurting a lot and unsure of why. )   Pt here for intermin F/U on meds  Started on GLP-1 therapy for weight loss, weight down 15lbs in the past 2 months  She is on ozempic 0.25mg  once a week  No vomiting or diarrhea   Eating more veggies and fruits,   she is eating smaller portions, and she eats slower   She is still following with psychiatry and feels she is doing   She left eye on lateral , no drainage, pain for hte past 2-3 days, vision is okay, she gets dry eye for some time, artficial tears , needs to see opthalmology     Review Of Systems:  GEN- denies fatigue, fever, weight loss,weakness, recent illness HEENT- denies eye drainage, change in vision, nasal discharge, CVS- denies chest pain, palpitations RESP- denies SOB, cough, wheeze ABD- denies N/V, change in stools, abd pain GU- denies dysuria, hematuria, dribbling, incontinence MSK- denies joint pain, muscle aches, injury Neuro- denies headache, dizziness, syncope, seizure activity       Objective:    BP 118/62   Pulse 74   Temp 97.9 F (36.6 C)   Resp 14   Ht 5\' 7"  (1.702 m)   Wt 223 lb (101.2 kg)   SpO2 99%   BMI 34.93 kg/m  GEN- NAD, alert and oriented x3 HEENT- PERRL, EOMI, non injected sclera, pink conjunctiva, MMM, oropharynx clear Neck- Supple, no thyromegaly CVS- RRR, no murmur RESP-CTAB EXT- No edema Pulses- Radial  2+        Assessment & Plan:      Problem List Items Addressed This Visit      Unprioritized   Class 1 obesity   Mild hyperlipidemia   Relevant Orders   Lipid panel   Pre-diabetes    Patient has done well with GLP-1 therapy this first 2 months. She has  decreased her portion sizes. She is still trying to decrease carbs and increase her water. She will have repeat labs prior to her next visit in 2 months peer we will continue Ozempic at 0.25 mg once a week.      Relevant Orders   Comprehensive metabolic panel   Hemoglobin A1c    Other Visit Diagnoses    Dry eye syndrome of both eyes    -  Primary   No sign of acute infection, referral to eye doctor    Relevant Orders   Ambulatory referral to Ophthalmology   Hypersensitivity reaction at injection site       Relevant Medications   diphenhydrAMINE (BENADRYL) capsule 25 mg (Completed)   Need for immunization against influenza       loclizaed rash with flu shot last year, given benadryl with flu shot today, pt did not have acute reaction   Relevant Orders   Flu Vaccine QUAD 36+ mos IM (Completed)      Note: This dictation was prepared with Dragon dictation along with smaller phrase technology. Any transcriptional errors that result from this process are unintentional.

## 2020-03-18 NOTE — Assessment & Plan Note (Signed)
Patient has done well with GLP-1 therapy this first 2 months. She has decreased her portion sizes. She is still trying to decrease carbs and increase her water. She will have repeat labs prior to her next visit in 2 months peer we will continue Ozempic at 0.25 mg once a week.

## 2020-03-18 NOTE — Patient Instructions (Addendum)
Ophthalmology referral  Continue ozempic at current dose Get labs done before visit  F/U 2 months

## 2020-04-06 ENCOUNTER — Telehealth: Payer: Self-pay | Admitting: *Deleted

## 2020-04-06 NOTE — Telephone Encounter (Signed)
Received request from pharmacy for PA on Ozempic.  PA submitted.   Dx: E66.09- obesity.  [facility] is reviewing your PA request. Typically an electronic response will be received within 72 hours. To check for an update later, open this request from your dashboard.  You may close this dialog and return to your dashboard to perform other tasks.

## 2020-04-07 NOTE — Telephone Encounter (Signed)
[id number] from 01/23/2020 to 01/22/2021 

## 2020-04-10 ENCOUNTER — Telehealth: Payer: 59 | Admitting: Psychiatry

## 2020-04-20 ENCOUNTER — Encounter: Payer: Self-pay | Admitting: Family Medicine

## 2020-04-20 DIAGNOSIS — R6889 Other general symptoms and signs: Secondary | ICD-10-CM

## 2020-04-20 DIAGNOSIS — R7303 Prediabetes: Secondary | ICD-10-CM

## 2020-04-20 DIAGNOSIS — R5383 Other fatigue: Secondary | ICD-10-CM

## 2020-04-20 DIAGNOSIS — E669 Obesity, unspecified: Secondary | ICD-10-CM

## 2020-04-20 NOTE — Telephone Encounter (Signed)
Okay to use DX Fatigue  Iron/ magnesium

## 2020-04-28 ENCOUNTER — Ambulatory Visit (INDEPENDENT_AMBULATORY_CARE_PROVIDER_SITE_OTHER): Payer: 59 | Admitting: Family Medicine

## 2020-04-28 ENCOUNTER — Other Ambulatory Visit: Payer: Self-pay

## 2020-04-28 ENCOUNTER — Encounter: Payer: Self-pay | Admitting: Family Medicine

## 2020-04-28 VITALS — BP 118/68 | HR 79 | Temp 97.8°F | Resp 15 | Ht 67.0 in | Wt 224.4 lb

## 2020-04-28 DIAGNOSIS — H04123 Dry eye syndrome of bilateral lacrimal glands: Secondary | ICD-10-CM

## 2020-04-28 DIAGNOSIS — E669 Obesity, unspecified: Secondary | ICD-10-CM | POA: Diagnosis not present

## 2020-04-28 DIAGNOSIS — E611 Iron deficiency: Secondary | ICD-10-CM

## 2020-04-28 MED ORDER — METHOCARBAMOL 500 MG PO TABS: 500.0000 mg | ORAL_TABLET | Freq: Three times a day (TID) | ORAL | 0 refills | Status: DC | PRN

## 2020-04-28 MED ORDER — OZEMPIC (0.25 OR 0.5 MG/DOSE) 2 MG/1.5ML ~~LOC~~ SOPN: 0.5000 mg | PEN_INJECTOR | SUBCUTANEOUS | 3 refills | Status: DC

## 2020-04-28 NOTE — Assessment & Plan Note (Signed)
increase ozempic to 0.5mg  once a week Low carb diet Increase protein

## 2020-04-28 NOTE — Progress Notes (Signed)
   Subjective:    Patient ID: Dominique Davis, female    DOB: 1986/04/25, 34 y.o.   MRN: [id number]  Patient presents for Follow-up (pt still struggling with dry eyes otherwise doing well )   Needs Referral to new eye doctor , never heard from last one  Obesity -   Currently on GLP1 - ozempiic 0.25mg      Has been more hungry past month, feels medicine is not as effective, craving sweets and she has been giving in more.    238lbs in August   No SE with meds    Menses more regular  Now, due this weekend  has had increased discharge right beforeher cycle No odor, no itching    Tired all the time, labs reviewed mild iron def with low percent sat      Review Of Systems:  GEN- denies fatigue, fever, weight loss,weakness, recent illness HEENT- denies eye drainage, change in vision, nasal discharge, CVS- denies chest pain, palpitations RESP- denies SOB, cough, wheeze ABD- denies N/V, change in stools, abd pain GU- denies dysuria, hematuria, dribbling, incontinence MSK- denies joint pain, muscle aches, injury Neuro- denies headache, dizziness, syncope, seizure activity       Objective:    BP 118/68   Pulse 79   Temp 97.8 F (36.6 C) (Temporal)   Resp 15   Ht 5\' 7"  (1.702 m)   Wt 224 lb 6.4 oz (101.8 kg)   SpO2 98%   BMI 35.15 kg/m  GEN- NAD, alert and oriented x3 HEENT- PERRL, EOMI, non injected sclera, pink conjunctiva, MMM, oropharynx clear CVS- RRR, no murmur RESP-CTAB ABD-NABS,soft,NT,ND EXT- No edema Pulses- Radial, DP- 2+        Assessment & Plan:   Physiologicdischarge, not examined today    Problem List Items Addressed This Visit      Unprioritized   Class 1 obesity - Primary    Other Visit Diagnoses    Iron deficiency       Dry eye syndrome of both eyes       Relevant Orders   Ambulatory referral to Ophthalmology      Note: This dictation was prepared with Dragon dictation along with smaller phrase technology. Any transcriptional errors that  result from this process are unintentional.

## 2020-04-28 NOTE — Patient Instructions (Addendum)
Slow Iron 160mg  once a day  Increase ozempic 0.5mg  to once a week  Referral to ophthalmology [facility]  F/U change Jan

## 2020-05-19 ENCOUNTER — Ambulatory Visit: Payer: 59 | Admitting: Family Medicine

## 2020-06-18 ENCOUNTER — Encounter: Payer: Self-pay | Admitting: Family Medicine

## 2020-06-22 ENCOUNTER — Other Ambulatory Visit: Payer: Self-pay | Admitting: Family Medicine

## 2020-06-29 ENCOUNTER — Ambulatory Visit (INDEPENDENT_AMBULATORY_CARE_PROVIDER_SITE_OTHER): Payer: 59 | Admitting: Family Medicine

## 2020-06-29 ENCOUNTER — Other Ambulatory Visit: Payer: Self-pay

## 2020-06-29 ENCOUNTER — Encounter: Payer: Self-pay | Admitting: Family Medicine

## 2020-06-29 VITALS — BP 116/82 | HR 80 | Temp 98.3°F | Resp 16 | Ht 67.0 in | Wt 218.0 lb

## 2020-06-29 DIAGNOSIS — E785 Hyperlipidemia, unspecified: Secondary | ICD-10-CM

## 2020-06-29 DIAGNOSIS — E669 Obesity, unspecified: Secondary | ICD-10-CM | POA: Diagnosis not present

## 2020-06-29 DIAGNOSIS — F331 Major depressive disorder, recurrent, moderate: Secondary | ICD-10-CM

## 2020-06-29 MED ORDER — OZEMPIC (1 MG/DOSE) 2 MG/1.5ML ~~LOC~~ SOPN: 1.0000 mg | PEN_INJECTOR | SUBCUTANEOUS | 2 refills | Status: DC

## 2020-06-29 MED ORDER — NORETHINDRONE ACET-ETHINYL EST 1-20 MG-MCG PO TABS
1.0000 | ORAL_TABLET | Freq: Every day | ORAL | 1 refills | Status: DC
Start: 2020-06-29 — End: 2020-11-27

## 2020-06-29 MED ORDER — EPINEPHRINE 0.3 MG/0.3ML IJ SOAJ
INTRAMUSCULAR | 1 refills | Status: DC
Start: 2020-06-29 — End: 2023-09-14

## 2020-06-29 NOTE — Patient Instructions (Addendum)
Increase to  Ozempic 1mg  once a day  F/U 2 months 

## 2020-06-29 NOTE — Assessment & Plan Note (Signed)
Doing well on current therapy No changes Her beloved pet passed in December and that was challenging for her

## 2020-06-29 NOTE — Assessment & Plan Note (Signed)
Overall appetite is improved, she would like to try higher dose Along with increasing protein/veggies Will change to 1mg  of ozempic Also start physical activity 2-3 days a week

## 2020-06-29 NOTE — Progress Notes (Signed)
   Subjective:    Patient ID: Dominique Davis, female    DOB: 1985/09/12, 35 y.o.   MRN: [id number]  Patient presents for Follow-up (Is not fasting)  Pt here to f/u medications for obesity/glucose intolerarance    At her visit in November Ozempic was increased to  0.5mg  once a week She is folowing lower carb diet and we discussed increasing protein with each meal  weight in November 224lbs today  218lbs down 6lbs     Initial weight 238lb August 2021 She was also started on slow iron 160mg  once a ay  day She is following with nutritionist about every other month. She is trying to get in more water, goal  96oz  Her periods have been irregulary No menses since November,  but also has not been on loestrin FE difficulty getting from the pharmacy she would like to try to get this from a local pharmacy    Review Of Systems:  GEN- denies fatigue, fever, weight loss,weakness, recent illness HEENT- denies eye drainage, change in vision, nasal discharge, CVS- denies chest pain, palpitations RESP- denies SOB, cough, wheeze ABD- denies N/V, change in stools, abd pain GU- denies dysuria, hematuria, dribbling, incontinence MSK- denies joint pain, muscle aches, injury Neuro- denies headache, dizziness, syncope, seizure activity       Objective:    BP 116/82   Pulse 80   Temp 98.3 F (36.8 C) (Temporal)   Resp 16   Ht 5\' 7"  (1.702 m)   Wt 218 lb (98.9 kg)   SpO2 98%   BMI 34.14 kg/m  GEN- NAD, alert and oriented x3 HEENT- PERRL, EOMI, non injected sclera, pink conjunctiva, MMM, oropharynx clear Neck- Supple, no thyromegaly CVS- RRR, no murmur RESP-CTAB ABD-NABS,soft,NT,ND Psych normal affect and mood  EXT- No edema Pulses- Radial, DP- 2+        Assessment & Plan:     She asked about oral HSV transmission with regards to a potentially new  partner, advised if she wants suppression Valtrex 500mg  once a day can be used    Problem List Items Addressed This Visit       Unprioritized   Class 1 obesity - Primary    Overall appetite is improved, she would like to try higher dose Along with increasing protein/veggies Will change to 1mg  of ozempic Also start physical activity 2-3 days a week       MDD (major depressive disorder)    Doing well on current therapy No changes Her beloved pet passed in December and that was challenging for her      Mild hyperlipidemia   Relevant Medications   EPINEPHrine 0.3 mg/0.3 mL IJ SOAJ injection      Note: This dictation was prepared with Dragon dictation along with smaller phrase technology. Any transcriptional errors that result from this process are unintentional.

## 2020-07-02 ENCOUNTER — Telehealth: Payer: Self-pay | Admitting: *Deleted

## 2020-07-02 NOTE — Telephone Encounter (Signed)
Received request from pharmacy for PA on Ozempic.  PA submitted.   Dx: E88.9- metabolic dysfunction.   [facility] is reviewing your PA request. Typically an electronic response will be received within 72 hours.

## 2020-07-02 NOTE — Telephone Encounter (Signed)
[id number] from 01/23/2020 to 01/22/2021

## 2020-07-17 ENCOUNTER — Telehealth: Payer: Self-pay | Admitting: Family Medicine

## 2020-07-17 ENCOUNTER — Other Ambulatory Visit: Payer: Self-pay

## 2020-07-17 ENCOUNTER — Ambulatory Visit: Payer: 59 | Admitting: Nurse Practitioner

## 2020-07-17 ENCOUNTER — Encounter: Payer: Self-pay | Admitting: Nurse Practitioner

## 2020-07-17 VITALS — BP 118/70 | HR 64 | Temp 98.2°F | Ht 67.0 in | Wt 218.2 lb

## 2020-07-17 DIAGNOSIS — J069 Acute upper respiratory infection, unspecified: Secondary | ICD-10-CM | POA: Diagnosis not present

## 2020-07-17 DIAGNOSIS — Z9109 Other allergy status, other than to drugs and biological substances: Secondary | ICD-10-CM

## 2020-07-17 MED ORDER — SALINE SPRAY 0.65 % NA SOLN
1.0000 | NASAL | 0 refills | Status: AC | PRN
Start: 2020-07-17 — End: ?

## 2020-07-17 MED ORDER — LEVOCETIRIZINE DIHYDROCHLORIDE 5 MG PO TABS
5.0000 mg | ORAL_TABLET | Freq: Every evening | ORAL | 3 refills | Status: DC
Start: 2020-07-17 — End: 2020-07-17

## 2020-07-17 MED ORDER — AMOXICILLIN 875 MG PO TABS: 875.0000 mg | ORAL_TABLET | Freq: Two times a day (BID) | ORAL | 0 refills | Status: DC

## 2020-07-17 MED ORDER — FLUTICASONE PROPIONATE 50 MCG/ACT NA SUSP: 2.0000 | Freq: Every day | NASAL | 6 refills | Status: AC

## 2020-07-17 NOTE — Telephone Encounter (Signed)
I agree; okay to schedule with me

## 2020-07-17 NOTE — Telephone Encounter (Signed)
Okay to schedule with Shanda Bumps

## 2020-07-17 NOTE — Patient Instructions (Signed)

## 2020-07-17 NOTE — Telephone Encounter (Signed)
Shanda Bumps advise [name]  to reach out to Dr.[place] if its okay taken her has pt

## 2020-07-17 NOTE — Progress Notes (Signed)
Subjective:    Patient ID: Dominique Davis, female    DOB: [birth year], 35 y.o.   MRN: [id number]  HPI: Dominique Davis is a 35 y.o. female presenting for "either sinus infection or ear infection."  Chief Complaint  Patient presents with  . Sinusitis   UPPER RESPIRATORY TRACT INFECTION Works at [employer], night shift. COVID-19 vaccine status: has received 2 doses of Pfizer vaccine, willing to get booster but has not received yet COVID-19 testing history: not tested this occurence Onset: earlier this week Worst symptom: pressure above eyes Fever: no Cough: no Shortness of breath: no Wheezing: no Chest pain: no Chest tightness: no Chest congestion: no Nasal congestion: yes Runny nose: yes Post nasal drip: no Sneezing: no Sore throat: yes Swollen glands: no Sinus pressure: yes Headache: yes Face pain: no Toothache: no Ear pain: R > L  Ear pressure: no  Eyes red/itching:no Eye drainage/crusting: no  Nausea: no  Vomiting: no Diarrhea: no  Change in appetite: no  Loss of taste/smell: no  Rash: no Fatigue: yes Sick contacts: no Strep contacts: no  Context: worse Recurrent sinusitis: no Treatments attempted: ibuprofen, goody powder Relief with OTC medications: mild  Allergies  Allergen Reactions  . Black Pepper [[place]per] Rash    Outpatient Encounter Medications as of 07/17/2020  Medication Sig  . amoxicillin (AMOXIL) 875 MG tablet Take 1 tablet (875 mg total) by mouth 2 (two) times daily.  . fluticasone (FLONASE) 50 MCG/ACT nasal spray Place 2 sprays into both nostrils daily.  . sodium chloride (OCEAN) 0.65 % SOLN nasal spray Place 1 spray into both nostrils as needed for congestion.  Marland Kitchen ALPRAZolam (XANAX) 0.5 MG tablet Take 1 tablet (0.5 mg total) by mouth 2 (two) times daily as needed for anxiety.  Marland Kitchen EPINEPHrine 0.3 mg/0.3 mL IJ SOAJ injection INJECT 0.3 ML (0.3 MG) INTO THE MUSCLE ONCE AS NEEDED AS DIRECTED  . hydrOXYzine (ATARAX/VISTARIL) 25 MG tablet TAKE 1  TO 2 TABLETS BY  MOUTH DAILY AS NEEDED. FOR  SEVERE ANXIETY ONLY  . levocetirizine (XYZAL) 5 MG tablet Take 1 tablet (5 mg total) by mouth every evening.  . methocarbamol (ROBAXIN) 500 MG tablet Take 1 tablet (500 mg total) by mouth every 8 (eight) hours as needed for muscle spasms.  . norethindrone-ethinyl estradiol (LOESTRIN) 1-20 MG-MCG tablet Take 1 tablet by mouth daily.  . Omega-3 Fatty Acids (FISH OIL PO) Take by mouth daily.  . Probiotic Product (CULTURELLE PROBIOTICS PO) Take by mouth daily.  . Semaglutide, 1 MG/DOSE, (OZEMPIC, 1 MG/DOSE,) 2 MG/1.5ML SOPN Inject 1 mg into the skin once a week.  . sertraline (ZOLOFT) 100 MG tablet TAKE 2 TABLETS BY MOUTH  DAILY  . traZODone (DESYREL) 100 MG tablet Take 1-2 tablets (100-200 mg total) by mouth at bedtime as needed for sleep.  . [DISCONTINUED] levocetirizine (XYZAL) 5 MG tablet TAKE 1 TABLET BY MOUTH IN  THE EVENING   No facility-administered encounter medications on file as of 07/17/2020.    Patient Active Problem List   Diagnosis Date Noted  . Mild hyperlipidemia 03/18/2020  . Pre-diabetes 03/18/2020  . Skin-picking disorder 10/08/2019  . PTSD (post-traumatic stress disorder) 08/07/2019  . Bloating 05/10/2019  . Abdominal pain 05/10/2019  . Small intestinal bacterial overgrowth 05/10/2019  . Encounter for insertion of mirena IUD 03/01/2016  . Insomnia 10/05/2015  . MDD (major depressive disorder) 10/05/2015  . Food allergy 03/04/2015  . Chronic knee pain 03/31/2014  . Loss of weight 11/21/2013  . Environmental  allergies 10/16/2013  . Gluten intolerance 10/16/2013  . Migraine headache 04/27/2013  . GAD (generalized anxiety disorder) 04/27/2013  . Chronic back pain 04/27/2013  . Class 1 obesity 08/30/2011    Past Medical History:  Diagnosis Date  . Anxiety   . Depression   . Dysmenorrhea   . Migraines   . Obesity   . Small intestinal bacterial overgrowth AUG 2015   CIP/FLAG X7 DAYS(2X 2015)    Relevant past  medical, surgical, family and social history reviewed and updated as indicated. Interim medical history since our last visit reviewed.  Review of Systems Per HPI unless specifically indicated above     Objective:    BP 118/70 (BP Location: Right Arm, Patient Position: Sitting, Cuff Size: Normal)   Pulse 64   Temp 98.2 F (36.8 C) (Oral)   Wt 218 lb 3.2 oz (99 kg)   SpO2 95%   BMI 34.17 kg/m   Wt Readings from Last 3 Encounters:  07/17/20 218 lb 3.2 oz (99 kg)  06/29/20 218 lb (98.9 kg)  04/28/20 224 lb 6.4 oz (101.8 kg)    Physical Exam Vitals and nursing note reviewed.  Constitutional:      General: She is not in acute distress.    Appearance: Normal appearance. She is not toxic-appearing.  HENT:     Head: Normocephalic and atraumatic.     Right Ear: Tympanic membrane, ear canal and external ear normal.     Left Ear: Tympanic membrane, ear canal and external ear normal.     Nose: Nose normal. No congestion or rhinorrhea.     Mouth/Throat:     Mouth: Mucous membranes are moist.     Pharynx: Oropharynx is clear. Posterior oropharyngeal erythema present.  Eyes:     General: No scleral icterus.    Extraocular Movements: Extraocular movements intact.  Cardiovascular:     Rate and Rhythm: Normal rate and regular rhythm.     Heart sounds: Normal heart sounds. No murmur heard.   Pulmonary:     Effort: Pulmonary effort is normal. No respiratory distress.     Breath sounds: No wheezing, rhonchi or rales.  Abdominal:     General: Abdomen is flat. Bowel sounds are normal.     Palpations: Abdomen is soft.  Musculoskeletal:        General: Normal range of motion.     Cervical back: Normal range of motion.     Right lower leg: No edema.     Left lower leg: No edema.  Lymphadenopathy:     Cervical: No cervical adenopathy.  Skin:    General: Skin is warm.     Capillary Refill: Capillary refill takes less than 2 seconds.     Coloration: Skin is not jaundiced or pale.      Findings: No erythema.  Neurological:     Mental Status: She is alert and oriented to person, place, and time.     Motor: No weakness.     Gait: Gait normal.  Psychiatric:        Mood and Affect: Mood normal.        Behavior: Behavior normal.        Thought Content: Thought content normal.        Judgment: Judgment normal.       Assessment & Plan:  1. Upper respiratory tract infection, unspecified type Acute, ongoing x 1 week.  COVID testing obtained.  Instructed to isolate at home until [facility] criteria met and note for  work given.  Reassured patient that symptoms and exam findings are most consistent with a viral upper respiratory infection and explained lack of efficacy of antibiotics against viruses.  Discussed expected course and features suggestive of secondary bacterial infection.  Continue supportive care. Increase fluid intake with water or electrolyte solution like pedialyte. Encouraged acetaminophen as needed for fever/pain. Encouraged salt water gargling, chloraseptic spray and throat lozenges. Encouraged OTC guaifenesin. Encouraged saline sinus flushes and/or neti with humidified air.  If symptoms do not start to gradually improve around this time next week, prescription given for amoxicillin for bacterial sinusitis.  - sodium chloride (OCEAN) 0.65 % SOLN nasal spray; Place 1 spray into both nostrils as needed for congestion.  Dispense: 88 mL; Refill: 0 - amoxicillin (AMOXIL) 875 MG tablet; Take 1 tablet (875 mg total) by mouth 2 (two) times daily.  Dispense: 14 tablet; Refill: 0 - SARS-COV-2 RNA,(COVID-19) QUAL NAAT  2. Environmental allergies Chronic, uncontrolled.  Continue hydroxyzine and start daily flonase.  Return to clinic if symptoms do not improve.  - fluticasone (FLONASE) 50 MCG/ACT nasal spray; Place 2 sprays into both nostrils daily.  Dispense: 16 g; Refill: 6    Follow up plan: Return if symptoms worsen or fail to improve.

## 2020-07-18 ENCOUNTER — Encounter: Payer: Self-pay | Admitting: Nurse Practitioner

## 2020-07-18 LAB — SARS-COV-2 RNA,(COVID-19) QUALITATIVE NAAT: SARS CoV2 RNA: NOT DETECTED

## 2020-08-17 ENCOUNTER — Other Ambulatory Visit: Payer: Self-pay

## 2020-08-17 ENCOUNTER — Other Ambulatory Visit (HOSPITAL_COMMUNITY)
Admission: RE | Admit: 2020-08-17 | Discharge: 2020-08-17 | Disposition: A | Discharge status: Home / Self Care | Payer: 59 | Source: Ambulatory Visit | Attending: Obstetrics & Gynecology | Admitting: Obstetrics & Gynecology

## 2020-08-17 ENCOUNTER — Ambulatory Visit: Payer: 59 | Admitting: Women's Health

## 2020-08-17 ENCOUNTER — Encounter: Payer: Self-pay | Admitting: Women's Health

## 2020-08-17 VITALS — BP 113/77 | HR 91 | Ht 67.0 in | Wt 209.0 lb

## 2020-08-17 DIAGNOSIS — N941 Unspecified dyspareunia: Secondary | ICD-10-CM | POA: Diagnosis not present

## 2020-08-17 DIAGNOSIS — N946 Dysmenorrhea, unspecified: Secondary | ICD-10-CM

## 2020-08-17 DIAGNOSIS — Z113 Encounter for screening for infections with a predominantly sexual mode of transmission: Secondary | ICD-10-CM | POA: Insufficient documentation

## 2020-08-17 DIAGNOSIS — N926 Irregular menstruation, unspecified: Secondary | ICD-10-CM | POA: Insufficient documentation

## 2020-08-17 DIAGNOSIS — L680 Hirsutism: Secondary | ICD-10-CM | POA: Diagnosis not present

## 2020-08-17 LAB — POCT URINE PREGNANCY: Preg Test, Ur: NEGATIVE

## 2020-08-17 NOTE — Progress Notes (Signed)
GYN VISIT Patient name: Dominique Davis MRN [id number]  Date of birth: 02/26/1986 Chief Complaint:   Menstrual Problem (Periods are irregular. Most recent period was prolonged. Pain intercourse)  History of Present Illness:   Dominique Davis is a 35 y.o. G0P0000 female being seen today for painful irregular periods and pain w/ sex.  Menarche at 35yo, periods have always been irregular, sometimes will have multiple periods in a month, sometimes goes 4+mths w/o a period. Periods are usually long (>7d), heavy and painful (whole body hurts from belly down)-otc meds don't help. Occ sharp LLQ before and during period. Headache 1wk prior to periods. Painful sex, lube and position changes don't help. Denies abnormal discharge, itching/odor/irritation.   Last period was 2/11- lasted x 7d then about a week later bled x 1-2d. Last period prior to that one was in Sept. Does have excess hair on chin/neck, plucks daily. +acne. No hair loss she has noticed, but mom recently told her looks like her hair is thinning. No acanthosis nigricans she is aware of. Dx w/ pre-diabetes last year and put on ozempic, has lost 30lbs. On COCs (Loestrin) since about 2019. Has tried multiple different types of birth control, and this seems to be the best option for her. Has problems w/ pharmacy, gets supply, but often they let her know on the day she needs it that it is out of stock, and doesn't get it for about 2wks.  Depression screen [facility] 2/9 01/15/2020 08/07/2019 01/23/2019 03/12/2018 01/05/2018  Decreased Interest 0 0 2 1 1   Down, Depressed, Hopeless 0 1 2 0 2  PHQ - 2 Score 0 1 4 1 3   Altered sleeping 2 2 2  - 3  Tired, decreased energy 2 2 2  - 2  Change in appetite 0 0 2 - 1  Feeling bad or failure about yourself  0 0 0 - 1  Trouble concentrating 0 2 2 - 1  Moving slowly or fidgety/restless 0 0 0 - 0  Suicidal thoughts 0 0 0 - 0  PHQ-9 Score 4 7 12  - 11  Difficult doing work/chores Not difficult at all Very difficult Very  difficult - Somewhat difficult  Some recent data might be hidden    Patient's last menstrual period was 07/17/2020. The current method of family planning is OCP (estrogen/progesterone).  Last pap 01/23/19. Results were: NILM w/ HRHPV not done Review of Systems:   Pertinent items are noted in HPI Denies fever/chills, dizziness, headaches, visual disturbances, fatigue, shortness of breath, chest pain, abdominal pain, vomiting, abnormal vaginal discharge/itching/odor/irritation, problems with periods, bowel movements, urination, or intercourse unless otherwise stated above.  Pertinent History Reviewed:  Reviewed past medical,surgical, social, obstetrical and family history.  Reviewed problem list, medications and allergies. Physical Assessment:   Vitals:   08/17/20 0933  BP: 113/77  Pulse: 91  Weight: 209 lb (94.8 kg)  Height: 5\' 7"  (1.702 m)  Body mass index is 32.73 kg/m.       Physical Examination:   General appearance: alert, well appearing, and in no distress  Mental status: alert, oriented to person, place, and time  Skin: warm & dry   Cardiovascular: normal heart rate noted  Respiratory: normal respiratory effort, no distress  Abdomen: soft, non-tender   Pelvic: VULVA: normal appearing vulva with no masses, tenderness or lesions, VAGINA: normal appearing vagina with normal color and discharge, no lesions, CERVIX: normal appearing cervix without discharge or lesions, UTERUS: uterus is normal size, shape, consistency and nontender, ADNEXA:  normal adnexa in size, nontender and no masses  Extremities: no edema   Chaperone: Faith Rogue    Results for orders placed or performed in visit on 08/17/20 (from the past 24 hour(s))  POCT urine pregnancy   Collection Time: 08/17/20  9:41 AM  Result Value Ref Range   Preg Test, Ur Negative Negative    Assessment & Plan:  1) Heavy painful irregular periods w/ dyspareunia> CV swab sent, will get pelvic u/s and discuss after. Likely  has PCOS (oligomenorrhea and hyperadrogenism/hirsutism), possibly endometriosis. Continue COC for now.   Meds: No orders of the defined types were placed in this encounter.   Orders Placed This Encounter  Procedures  . US PELVIS (TRANSABDOMINAL ONLY)  . US PELVIS TRANSVAGINAL NON-OB (TV ONLY)  . POCT urine pregnancy    Return for 1st available, US:GYN, F/U, with me.  Cheral Marker CNM, [facility] 08/17/2020 10:09 AM

## 2020-08-17 NOTE — Patient Instructions (Signed)
Polycystic Ovary Syndrome  Polycystic ovarian syndrome (PCOS) is a common hormonal disorder among women of reproductive age. In most women with PCOS, small fluid-filled sacs (cysts) grow on the ovaries. PCOS can cause problems with menstrual periods and make it hard to get and stay pregnant. If this condition is not treated, it can lead to serious health problems, such as diabetes and heart disease. What are the causes? The cause of this condition is not known. It may be due to certain factors, such as:  Irregular menstrual cycle.  High levels of certain hormones.  Problems with the hormone that helps to control blood sugar (insulin).  Certain genes. What increases the risk? You are more likely to develop this condition if you:  Have a family history of PCOS or type 2 diabetes.  Are overweight, eat unhealthy foods, and are not active. These factors may cause problems with blood sugar control, which can contribute to PCOS or PCOS symptoms. What are the signs or symptoms? Symptoms of this condition include:  Ovarian cysts and sometimes pelvic pain.  Menstrual periods that are not regular or are too heavy.  Inability to get or stay pregnant.  Increased growth of hair on the face, chest, stomach, back, thumbs, thighs, or toes.  Acne or oily skin. Acne may develop during adulthood, and it may not get better with treatment.  Weight gain or obesity.  Patches of thickened and dark brown or black skin on the neck, arms, breasts, or thighs. How is this diagnosed? This condition is diagnosed based on:  Your medical history.  A physical exam that includes a pelvic exam. Your health care provider may look for areas of increased hair growth on your skin.  Tests, such as: ? An ultrasound to check the ovaries for cysts and to view the lining of the uterus. ? Blood tests to check levels of sugar (glucose), female hormone (testosterone), and female hormones (estrogen and progesterone). How  is this treated? There is no cure for this condition, but treatment can help to manage symptoms and prevent more health problems from developing. Treatment varies depending on your symptoms and if you want to have a baby or if you need birth control. Treatment may include:  Making nutrition and lifestyle changes.  Taking the progesterone hormone to start a menstrual period.  Taking birth control pills to help you have regular menstrual periods.  Taking medicines such as: ? Medicines to make you ovulate, if you want to get pregnant. ? Medicine to reduce extra hair growth.  Having surgery in severe cases. This may involve making small holes in one or both of your ovaries. This decreases the amount of testosterone that your body makes. Follow these instructions at home:  Take over-the-counter and prescription medicines only as told by your health care provider.  Follow a healthy meal plan that includes lean proteins, complex carbohydrates, fresh fruits and vegetables, low-fat dairy products, healthy fats, and fiber.  If you are overweight, lose weight as told by your health care provider. Your health care provider can determine how much weight loss is best for you and can help you lose weight safely.  Keep all follow-up visits. This is important. Contact a health care provider if:  Your symptoms do not get better with medicine.  Your symptoms get worse or you develop new symptoms. Summary  Polycystic ovarian syndrome (PCOS) is a common hormonal disorder among women of reproductive age.  PCOS can cause problems with menstrual periods and make it hard   to get and stay pregnant.  If this condition is not treated, it can lead to serious health problems, such as diabetes and heart disease.  There is no cure for this condition, but treatment can help to manage symptoms and prevent more health problems from developing. This information is not intended to replace advice given to you by your  health care provider. Make sure you discuss any questions you have with your health care provider. Document Revised: 10/31/2019 Document Reviewed: 10/31/2019 Elsevier Patient Education  2021 Elsevier Inc.    Diet for Polycystic Ovary Syndrome Polycystic ovary syndrome (PCOS) is a common hormonal disorder that affects a woman's reproductive system. It can cause problems with menstrual periods and make it hard to get and stay pregnant. Changing what you eat can help your hormones reach normal levels, improve your health, and help you better manage PCOS. Following a balanced diet can help you lose weight and improve the way that your body uses the hormone insulin to control blood sugar. This may include:  Eating low-fat (lean) proteins, complex carbohydrates, fresh fruits and vegetables, low-fat dairy products, healthy fats, and fiber.  Cutting down on calories.  Exercising regularly. What are tips for following this plan?  Follow a balanced diet for meals and snacks. Eat breakfast, lunch, dinner, and one or two snacks every day.  Include protein in each meal and snack.  Choose whole grains instead of products that are made with refined flour.  Eat a variety of foods.  Exercise regularly as told by your health care provider. Aim to do at least 30 minutes of exercise on most days of the week.  If you are overweight or obese: ? Pay attention to how many calories you eat. Cutting down on calories can help you lose weight. ? Work with your health care provider or a dietitian to figure out how many calories you need each day. What foods should I eat? Fruits Include a variety of colors and types. All fruits are helpful for PCOS. Vegetables Include a variety of colors and types. All vegetables are helpful for PCOS. Grains Whole grains, such as whole wheat. Whole-grain breads, crackers, cereals, and pasta. Unsweetened oatmeal. Bulgur, barley, quinoa, and brown rice. Tortillas made from corn  or whole-wheat flour. Meats and other proteins Lean proteins, such as fish, chicken, beans, eggs, and tofu. Dairy Low-fat dairy products, such as skim milk, cheese sticks, and yogurt. Beverages Low-fat or fat-free drinks, such as water, low-fat milk, sugar-free drinks, and small amounts of 100% fruit juice. Seasonings and condiments Ketchup. Mustard. Barbecue sauce. Relish. Low-fat or fat-free mayonnaise. Fats and oils Olive oil or canola oil. Walnuts and almonds. The items listed above may not be a complete list of recommended foods and beverages. Contact a dietitian for more options.   What foods should I avoid? Foods that are high in calories or fat, especially saturated or trans fats. Fried foods. Sweets. Products that are made from refined white flour, including white bread, pastries, white rice, and pasta. The items listed above may not be a complete list of foods and beverages to avoid. Contact a dietitian for more information. Summary  PCOS is a hormonal imbalance that affects a woman's reproductive system. It can cause problems with menstrual periods and make it hard to get and stay pregnant.  You can help to manage your PCOS by exercising regularly and eating a healthy, varied diet of vegetables, fruit, whole grains, lean protein, and low-fat dairy products.  Changing what you eat   can improve the way that your body uses insulin, help your hormones reach normal levels, and help you lose weight. This information is not intended to replace advice given to you by your health care provider. Make sure you discuss any questions you have with your health care provider. Document Revised: 10/31/2019 Document Reviewed: 10/31/2019 Elsevier Patient Education  2021 Elsevier Inc.    

## 2020-08-18 LAB — CERVICOVAGINAL ANCILLARY ONLY
Bacterial Vaginitis (gardnerella): NEGATIVE
Candida Glabrata: NEGATIVE
Candida Vaginitis: NEGATIVE
Chlamydia: NEGATIVE
Comment: NEGATIVE
Comment: NEGATIVE
Comment: NEGATIVE
Comment: NEGATIVE
Comment: NEGATIVE
Comment: NORMAL
Neisseria Gonorrhea: NEGATIVE
Trichomonas: NEGATIVE

## 2020-08-24 ENCOUNTER — Ambulatory Visit: Payer: 59 | Admitting: Nurse Practitioner

## 2020-08-27 ENCOUNTER — Ambulatory Visit (INDEPENDENT_AMBULATORY_CARE_PROVIDER_SITE_OTHER): Payer: 59 | Admitting: Nurse Practitioner

## 2020-08-27 ENCOUNTER — Encounter: Payer: Self-pay | Admitting: Nurse Practitioner

## 2020-08-27 ENCOUNTER — Other Ambulatory Visit: Payer: Self-pay

## 2020-08-27 VITALS — BP 112/60 | HR 58 | Temp 98.4°F | Resp 14 | Ht 67.0 in | Wt 208.0 lb

## 2020-08-27 DIAGNOSIS — G8929 Other chronic pain: Secondary | ICD-10-CM

## 2020-08-27 DIAGNOSIS — M545 Low back pain, unspecified: Secondary | ICD-10-CM | POA: Diagnosis not present

## 2020-08-27 DIAGNOSIS — E669 Obesity, unspecified: Secondary | ICD-10-CM

## 2020-08-27 MED ORDER — CYCLOBENZAPRINE HCL 5 MG PO TABS: 5.0000 mg | ORAL_TABLET | Freq: Three times a day (TID) | ORAL | 1 refills | Status: DC | PRN

## 2020-08-27 NOTE — Assessment & Plan Note (Addendum)
Overall down 30 lbs since starting Ozempic in August 2021.  Congratulated for great lifestyle changes.  She is working with nutritionist as well and eating slower, drinking more water.  She is walking multiple times per week.  Is going to try to increase weight bearing activity slowly.  Will continue Ozempic at 1mg  dosing and check CMP with GFR and HgbA1c with employer.  Follow up in 3 months.

## 2020-08-27 NOTE — Assessment & Plan Note (Signed)
With no benefit from Robaxin, will trial Flexeril and monitor for improvement.

## 2020-08-27 NOTE — Patient Instructions (Signed)
F/u 3 months for prediabetes & Ozempic

## 2020-08-27 NOTE — Progress Notes (Signed)
Subjective:    Patient ID: Dominique Davis, female    DOB: Aug 31, 1985, 35 y.o.   MRN: [id number]  HPI: [name] is a 35 y.o. female presenting for weight loss follow up.  Chief Complaint  Patient presents with  . Follow-up    Ozempic   OBESITY Ozempic is going very well.  Used to binge eat; feels she has developed much healthier eating habits.  She eats slowerly and does not feel uncomfortable laying on her stomach.  Reportedly A1c was 5.7% in August and has dropped to 5.3% more recently.  Currently, drinks 48 oz of water per day and trying to increase this. Reports she is tolerating the Ozempic very well and only has some nausea if she goes too long without eating.  She is working on increasing physical activity and is going to try to incorporate strength training.  Duration: chronic Previous attempts at weight loss: yes;  Complications of obesity: prediabetes, hld Peak weight: 238 lbs Weight loss goal: ~170 lbs Weight loss to date: 30 lbs Current weight loss: 30 lbs supplements/medications: Ozempic 1 mg  Previous weight loss supplements/meds: no  Chronic back pain - previously prescribed Robaxin to help with flares; did not see much improvement.  Willing to try another muscle relaxant to use sparingly.    Allergies  Allergen Reactions  . Black Pepper [Piper] Rash    Outpatient Encounter Medications as of 08/27/2020  Medication Sig  . cyclobenzaprine (FLEXERIL) 5 MG tablet Take 1 tablet (5 mg total) by mouth 3 (three) times daily as needed for muscle spasms.  Marland Kitchen EPINEPHrine 0.3 mg/0.3 mL IJ SOAJ injection INJECT 0.3 ML (0.3 MG) INTO THE MUSCLE ONCE AS NEEDED AS DIRECTED  . ferrous sulfate 324 MG TBEC Take 324 mg by mouth.  . fluticasone (FLONASE) 50 MCG/ACT nasal spray Place 2 sprays into both nostrils daily.  . hydrOXYzine (ATARAX/VISTARIL) 25 MG tablet TAKE 1 TO 2 TABLETS BY  MOUTH DAILY AS NEEDED. FOR  SEVERE ANXIETY ONLY  . norethindrone-ethinyl estradiol  (LOESTRIN) 1-20 MG-MCG tablet Take 1 tablet by mouth daily.  . Omega-3 Fatty Acids (FISH OIL PO) Take by mouth daily.  . Probiotic Product (CULTURELLE PROBIOTICS PO) Take by mouth daily.  . Semaglutide, 1 MG/DOSE, (OZEMPIC, 1 MG/DOSE,) 2 MG/1.5ML SOPN Inject 1 mg into the skin once a week.  . sertraline (ZOLOFT) 100 MG tablet TAKE 2 TABLETS BY MOUTH  DAILY  . sodium chloride (OCEAN) 0.65 % SOLN nasal spray Place 1 spray into both nostrils as needed for congestion.  . [DISCONTINUED] methocarbamol (ROBAXIN) 500 MG tablet Take 1 tablet (500 mg total) by mouth every 8 (eight) hours as needed for muscle spasms.  . traZODone (DESYREL) 100 MG tablet Take 1-2 tablets (100-200 mg total) by mouth at bedtime as needed for sleep. (Patient not taking: Reported on 08/27/2020)   No facility-administered encounter medications on file as of 08/27/2020.    Patient Active Problem List   Diagnosis Date Noted  . Mild hyperlipidemia 03/18/2020  . Pre-diabetes 03/18/2020  . Skin-picking disorder 10/08/2019  . PTSD (post-traumatic stress disorder) 08/07/2019  . Bloating 05/10/2019  . Abdominal pain 05/10/2019  . Small intestinal bacterial overgrowth 05/10/2019  . Encounter for insertion of mirena IUD 03/01/2016  . Insomnia 10/05/2015  . MDD (major depressive disorder) 10/05/2015  . Food allergy 03/04/2015  . Chronic knee pain 03/31/2014  . Loss of weight 11/21/2013  . Environmental allergies 10/16/2013  . Gluten intolerance 10/16/2013  . Migraine headache  04/27/2013  . GAD (generalized anxiety disorder) 04/27/2013  . Chronic back pain 04/27/2013  . Class 1 obesity 08/30/2011    Past Medical History:  Diagnosis Date  . Anxiety   . Depression   . Dysmenorrhea   . Migraines   . Obesity   . Small intestinal bacterial overgrowth AUG 2015   CIP/FLAG X7 DAYS(2X 2015)    Relevant past medical, surgical, family and social history reviewed and updated as indicated. Interim medical history since our last  visit reviewed.  Review of Systems Per HPI unless specifically indicated above     Objective:    BP 112/60   Pulse (!) 58   Temp 98.4 F (36.9 C) (Temporal)   Resp 14   Ht 5\' 7"  (1.702 m)   Wt 208 lb (94.3 kg)   SpO2 97%   BMI 32.58 kg/m   Wt Readings from Last 3 Encounters:  08/27/20 208 lb (94.3 kg)  08/17/20 209 lb (94.8 kg)  07/17/20 218 lb 3.2 oz (99 kg)    Physical Exam Vitals and nursing note reviewed.  Constitutional:      General: She is not in acute distress.    Appearance: Normal appearance. She is not toxic-appearing.  HENT:     Head: Normocephalic and atraumatic.  Eyes:     General: No scleral icterus.    Extraocular Movements: Extraocular movements intact.  Cardiovascular:     Rate and Rhythm: Normal rate and regular rhythm.     Heart sounds: Normal heart sounds. No murmur heard.   Pulmonary:     Effort: Pulmonary effort is normal. No respiratory distress.     Breath sounds: Normal breath sounds. No wheezing, rhonchi or rales.  Abdominal:     General: Abdomen is flat. Bowel sounds are normal.     Palpations: Abdomen is soft.  Musculoskeletal:        General: Normal range of motion.     Cervical back: Normal range of motion.     Right lower leg: No edema.     Left lower leg: No edema.  Skin:    General: Skin is warm and dry.     Capillary Refill: Capillary refill takes less than 2 seconds.     Coloration: Skin is not jaundiced or pale.     Findings: No erythema.  Neurological:     Mental Status: She is alert and oriented to person, place, and time.     Motor: No weakness.     Gait: Gait normal.  Psychiatric:        Mood and Affect: Mood normal.        Behavior: Behavior normal.        Thought Content: Thought content normal.        Judgment: Judgment normal.        Assessment & Plan:   Problem List Items Addressed This Visit      Other   Class 1 obesity - Primary    Overall down 30 lbs since starting Ozempic in August 2021.   Congratulated for great lifestyle changes.  She is working with nutritionist as well and eating slower, drinking more water.  She is walking multiple times per week.  Is going to try to increase weight bearing activity slowly.  Will continue Ozempic at 1mg  dosing and check CMP with GFR and HgbA1c with employer.  Follow up in 3 months.      Relevant Orders   Comp. Metabolic Panel (14)   Hgb A1c  w/o eAG   Chronic back pain    With no benefit from Robaxin, will trial Flexeril and monitor for improvement.       Relevant Medications   cyclobenzaprine (FLEXERIL) 5 MG tablet       Follow up plan: Return in about 3 months (around 11/27/2020) for Weight loss.

## 2020-09-01 LAB — COMPREHENSIVE METABOLIC PANEL
Albumin: 4.7 (ref 3.5–5.0)
Calcium: 9.2 (ref 8.7–10.7)
GFR calc non Af Amer: 77
Globulin: 2.4

## 2020-09-01 LAB — BASIC METABOLIC PANEL
BUN: 12 (ref 4–21)
CO2: 19 (ref 13–22)
Chloride: 100 (ref 99–108)
Creatinine: 1 (ref 0.5–1.1)
Glucose: 78
Potassium: 4.2 (ref 3.4–5.3)
Sodium: 139 (ref 137–147)

## 2020-09-01 LAB — HEPATIC FUNCTION PANEL
ALT: 10 (ref 7–35)
AST: 12 — AB (ref 13–35)
Alkaline Phosphatase: 37 (ref 25–125)

## 2020-09-01 LAB — HEMOGLOBIN A1C: Hemoglobin A1C: 5

## 2020-09-02 ENCOUNTER — Encounter: Payer: Self-pay | Admitting: Women's Health

## 2020-09-02 ENCOUNTER — Other Ambulatory Visit: Payer: Self-pay

## 2020-09-02 ENCOUNTER — Ambulatory Visit (INDEPENDENT_AMBULATORY_CARE_PROVIDER_SITE_OTHER): Payer: 59

## 2020-09-02 ENCOUNTER — Ambulatory Visit (INDEPENDENT_AMBULATORY_CARE_PROVIDER_SITE_OTHER): Payer: 59 | Admitting: Women's Health

## 2020-09-02 ENCOUNTER — Encounter: Payer: Self-pay | Admitting: Nurse Practitioner

## 2020-09-02 VITALS — BP 112/74 | HR 75 | Ht 67.0 in | Wt 207.4 lb

## 2020-09-02 DIAGNOSIS — N946 Dysmenorrhea, unspecified: Secondary | ICD-10-CM

## 2020-09-02 DIAGNOSIS — N926 Irregular menstruation, unspecified: Secondary | ICD-10-CM

## 2020-09-02 DIAGNOSIS — E282 Polycystic ovarian syndrome: Secondary | ICD-10-CM

## 2020-09-02 DIAGNOSIS — N941 Unspecified dyspareunia: Secondary | ICD-10-CM

## 2020-09-02 DIAGNOSIS — L680 Hirsutism: Secondary | ICD-10-CM

## 2020-09-02 MED ORDER — OZEMPIC (1 MG/DOSE) 2 MG/1.5ML ~~LOC~~ SOPN: 1.0000 mg | PEN_INJECTOR | SUBCUTANEOUS | 2 refills | Status: DC

## 2020-09-02 NOTE — Progress Notes (Addendum)
PELVIC US TA/TV: small homogenous anteverted uterus,wnl,EEC 1.5 mm,normal ovaries,ovaries appear mobile,left adnexal discomfort during ultrasound,no free fluid  Chaperone Peggy

## 2020-09-02 NOTE — Progress Notes (Signed)
GYN VISIT Patient name: Dominique Davis MRN [id number]  Date of birth: August 23, 1985 Chief Complaint:   Follow-up ([place])  History of Present Illness:   Dominique Davis is a 35 y.o. G0P0000 Hispanic female being seen today for f/u after gyn u/s for heavy painful irregular periods w/ dyspaurenia. Also has hirsutism. Dx w/ pre-diabetes last year, has lost 30lbs on ozempic. On Loestrin since 2019, has trouble getting them from pharmacy, so interested in trying something else. Discussed options, may want Nexplanon. Concerned about possibility of wt gain/worsening dep/anx. CV swab neg for all last visit.  Depression screen [facility] 2/9 01/15/2020 08/07/2019 01/23/2019 03/12/2018 01/05/2018  Decreased Interest 0 0 2 1 1   Down, Depressed, Hopeless 0 1 2 0 2  PHQ - 2 Score 0 1 4 1 3   Altered sleeping 2 2 2  - 3  Tired, decreased energy 2 2 2  - 2  Change in appetite 0 0 2 - 1  Feeling bad or failure about yourself  0 0 0 - 1  Trouble concentrating 0 2 2 - 1  Moving slowly or fidgety/restless 0 0 0 - 0  Suicidal thoughts 0 0 0 - 0  PHQ-9 Score 4 7 12  - 11  Difficult doing work/chores Not difficult at all Very difficult Very difficult - Somewhat difficult  Some recent data might be hidden    Patient's last menstrual period was 07/17/2020 (approximate). The current method of family planning is OCP (estrogen/progesterone).  Last pap 01/23/19. Results were: NILM w/ HRHPV negative Review of Systems:   Pertinent items are noted in HPI Denies fever/chills, dizziness, headaches, visual disturbances, fatigue, shortness of breath, chest pain, abdominal pain, vomiting, abnormal vaginal discharge/itching/odor/irritation, problems with periods, bowel movements, urination, or intercourse unless otherwise stated above.  Pertinent History Reviewed:  Reviewed past medical,surgical, social, obstetrical and family history.  Reviewed problem list, medications and allergies. Physical Assessment:   Vitals:   09/02/20 1008  BP:  112/74  Pulse: 75  Weight: 207 lb 6.4 oz (94.1 kg)  Height: 5\' 7"  (1.702 m)  Body mass index is 32.48 kg/m.       Physical Examination:   General appearance: alert, well appearing, and in no distress  Mental status: alert, oriented to person, place, and time  Skin: warm & dry   Cardiovascular: normal heart rate noted  Respiratory: normal respiratory effort, no distress  Abdomen: soft, non-tender   Pelvic: examination not indicated  Extremities: no edema   Chaperone: N/A     Dominique Davis is a 35 y.o. G0P0000 LMP 07/17/2020,She is here for a pelvic sonogram for dysmenorrhea,dyspareunia and irregular periods.  Uterus                      4.4 x 2.6 x 4 cm, Total uterine volume 24 cc,small homogenous anteverted uterus Endometrium          1.5 mm, symmetrical, wnl Right ovary             3.8 x 1.8 x 2.5 cm, wnl Left ovary                2.8 x 1.9 x 1.9 cm, wnl  No free fluid Technician Comments: PELVIC 01/25/19 TA/TV: small homogenous anteverted uterus,wnl,EEC 1.5 mm,normal ovaries,ovaries appear mobile,left adnexal discomfort during ultrasound,no free fluid  Chaperone Peggy Amber 09/04/20 09/02/2020 9:59 AM  No results found for this or any previous visit (from the past 24 hour(s)).  Assessment & Plan:  1) PCOS w/ heavy painful irregular periods w/ dyspareunia> PCOS dx based on oligomenorrhea and hirsutism (no PCO on u/s today). Normal pelvic u/s today, neg CV swab last visit. On Loestrin, but trouble getting in a timely fashion from pharmacy. Interested in trying something else, discussed options. Wants to think about Nexplanon, will let us know. If does want, needs to come on period or 14d from last sex.   Meds: No orders of the defined types were placed in this encounter.   No orders of the defined types were placed in this encounter.   Return in about 1 year (around 09/02/2021) for Pap & physical.  Cheral Marker CNM, [facility] 09/02/2020 11:25 AM

## 2020-09-21 ENCOUNTER — Encounter: Payer: Self-pay | Admitting: Nurse Practitioner

## 2020-10-01 ENCOUNTER — Encounter: Payer: Self-pay | Admitting: Nurse Practitioner

## 2020-10-01 ENCOUNTER — Other Ambulatory Visit: Payer: Self-pay

## 2020-10-01 ENCOUNTER — Ambulatory Visit: Payer: 59 | Admitting: Nurse Practitioner

## 2020-10-01 VITALS — BP 120/80 | HR 74 | Temp 97.2°F | Ht 67.0 in | Wt 204.0 lb

## 2020-10-01 DIAGNOSIS — M79632 Pain in left forearm: Secondary | ICD-10-CM

## 2020-10-01 MED ORDER — PREDNISONE 10 MG PO TABS: 20.0000 mg | ORAL_TABLET | Freq: Every day | ORAL | 0 refills | Status: DC

## 2020-10-01 NOTE — Progress Notes (Signed)
Subjective:    Patient ID: Dominique Davis, female    DOB: 1986-04-16, 35 y.o.   MRN: [id number]  HPI: Dominique Davis is a 35 y.o. female presenting for left arm pain.  Chief Complaint  Patient presents with  . Pain    Pain in the left arm for the past 2 month. Does lots of lifting at wk. No injury or imaging. Has fibromyalgia   ARM PAIN Duration: months Location: left forearm Mechanism of injury: unknown ; broke left wrist as a child, fractured left shoulder  Onset: sudden;  Severity: ranges - mild to severe Quality: throbbing Frequency: daily; multiple times per day Radiation: no Aggravating factors:   Alleviating factors: muscle ralaxant, ibuprofen  Status: unable to tell Treatments attempted: heat, ibuprofen, muscle ralxant   Relief with NSAIDs?:  no Swelling: no Redness: no  Warmth: no Trauma: no Chest pain: no Shortness of breath: no  Fever: no Decreased sensation: no Paresthesias: no Weakness: no  Allergies  Allergen Reactions  . Black Pepper [Piper] Rash    Outpatient Encounter Medications as of 10/01/2020  Medication Sig  . cyclobenzaprine (FLEXERIL) 5 MG tablet Take 1 tablet (5 mg total) by mouth 3 (three) times daily as needed for muscle spasms.  Marland Kitchen EPINEPHrine 0.3 mg/0.3 mL IJ SOAJ injection INJECT 0.3 ML (0.3 MG) INTO THE MUSCLE ONCE AS NEEDED AS DIRECTED  . ferrous sulfate 324 MG TBEC Take 324 mg by mouth.  . fluticasone (FLONASE) 50 MCG/ACT nasal spray Place 2 sprays into both nostrils daily.  . hydrOXYzine (ATARAX/VISTARIL) 25 MG tablet TAKE 1 TO 2 TABLETS BY  MOUTH DAILY AS NEEDED. FOR  SEVERE ANXIETY ONLY  . norethindrone-ethinyl estradiol (LOESTRIN) 1-20 MG-MCG tablet Take 1 tablet by mouth daily.  . Omega-3 Fatty Acids (FISH OIL PO) Take by mouth daily.  . predniSONE (DELTASONE) 10 MG tablet Take 2 tablets (20 mg total) by mouth daily with breakfast.  . Probiotic Product (CULTURELLE PROBIOTICS PO) Take by mouth daily.  . Semaglutide, 1  MG/DOSE, (OZEMPIC, 1 MG/DOSE,) 2 MG/1.5ML SOPN Inject 1 mg into the skin once a week.  . sertraline (ZOLOFT) 100 MG tablet TAKE 2 TABLETS BY MOUTH  DAILY  . sodium chloride (OCEAN) 0.65 % SOLN nasal spray Place 1 spray into both nostrils as needed for congestion.  . traZODone (DESYREL) 100 MG tablet Take 1-2 tablets (100-200 mg total) by mouth at bedtime as needed for sleep.  . RESTASIS 0.05 % ophthalmic emulsion    No facility-administered encounter medications on file as of 10/01/2020.    Patient Active Problem List   Diagnosis Date Noted  . PCOS (polycystic ovarian syndrome) 09/02/2020  . Mild hyperlipidemia 03/18/2020  . Pre-diabetes 03/18/2020  . Skin-picking disorder 10/08/2019  . PTSD (post-traumatic stress disorder) 08/07/2019  . Bloating 05/10/2019  . Abdominal pain 05/10/2019  . Small intestinal bacterial overgrowth 05/10/2019  . Encounter for insertion of mirena IUD 03/01/2016  . Insomnia 10/05/2015  . MDD (major depressive disorder) 10/05/2015  . Food allergy 03/04/2015  . Chronic knee pain 03/31/2014  . Loss of weight 11/21/2013  . Environmental allergies 10/16/2013  . Gluten intolerance 10/16/2013  . Migraine headache 04/27/2013  . GAD (generalized anxiety disorder) 04/27/2013  . Chronic back pain 04/27/2013  . Class 1 obesity 08/30/2011    Past Medical History:  Diagnosis Date  . Anxiety   . Depression   . Dysmenorrhea   . Migraines   . Obesity   . Small intestinal bacterial overgrowth  AUG 2015   CIP/FLAG X7 DAYS(2X 2015)    Relevant past medical, surgical, family and social history reviewed and updated as indicated. Interim medical history since our last visit reviewed.  Review of Systems Per HPI unless specifically indicated above     Objective:    BP 120/80   Pulse 74   Temp (!) 97.2 F (36.2 C)   Ht 5\' 7"  (1.702 m)   Wt 204 lb (92.5 kg)   SpO2 98%   BMI 31.95 kg/m   Wt Readings from Last 3 Encounters:  10/01/20 204 lb (92.5 kg)   09/02/20 207 lb 6.4 oz (94.1 kg)  08/27/20 208 lb (94.3 kg)    Physical Exam Vitals and nursing note reviewed.  Constitutional:      General: She is not in acute distress.    Appearance: Normal appearance. She is not toxic-appearing.  Musculoskeletal:     Right shoulder: Normal.     Left shoulder: Normal.     Right upper arm: Normal.     Left upper arm: Normal.     Right elbow: Normal.     Left elbow: Normal.     Right forearm: Normal.     Left forearm: Tenderness present.       Arms:     Comments: Tender to area marked with adduction  Skin:    General: Skin is warm and dry.     Capillary Refill: Capillary refill takes less than 2 seconds.     Coloration: Skin is not jaundiced or pale.     Findings: No erythema.  Neurological:     Mental Status: She is alert and oriented to person, place, and time.  Psychiatric:        Mood and Affect: Mood normal.        Thought Content: Thought content normal.        Judgment: Judgment normal.       Assessment & Plan:  1. Left forearm pain Acute.  Likely fibromyalgia flare versus muscular pain related to overuse at work.  Will treat with rest, encouraged ice and compression at home.  We will also start on short prednisone burst.  Return to clinic with no improvement in pain.  - predniSONE (DELTASONE) 10 MG tablet; Take 2 tablets (20 mg total) by mouth daily with breakfast.  Dispense: 10 tablet; Refill: 0    Follow up plan: Return if symptoms worsen or fail to improve.

## 2020-10-06 ENCOUNTER — Encounter: Payer: Self-pay | Admitting: Nurse Practitioner

## 2020-10-08 ENCOUNTER — Encounter: Payer: Self-pay | Admitting: Nurse Practitioner

## 2020-10-08 DIAGNOSIS — M79632 Pain in left forearm: Secondary | ICD-10-CM

## 2020-10-08 MED ORDER — NAPROXEN 500 MG PO TABS: 500.0000 mg | ORAL_TABLET | Freq: Two times a day (BID) | ORAL | 0 refills | Status: DC

## 2020-10-13 ENCOUNTER — Encounter: Payer: Self-pay | Admitting: Orthopedic Surgery

## 2020-10-13 ENCOUNTER — Other Ambulatory Visit: Payer: Self-pay

## 2020-10-13 ENCOUNTER — Ambulatory Visit: Payer: 59 | Admitting: Orthopedic Surgery

## 2020-10-13 VITALS — BP 121/82 | HR 85 | Ht 67.0 in | Wt 202.0 lb

## 2020-10-13 DIAGNOSIS — G5632 Lesion of radial nerve, left upper limb: Secondary | ICD-10-CM
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
## 2020-10-13 NOTE — Patient Instructions (Signed)
[facility] out patient Re[facility] [phone]

## 2020-11-04 ENCOUNTER — Other Ambulatory Visit: Payer: Self-pay

## 2020-11-05 MED ORDER — NAPROXEN 500 MG PO TABS: 500.0000 mg | ORAL_TABLET | Freq: Two times a day (BID) | ORAL | 0 refills | Status: DC

## 2020-11-15 ENCOUNTER — Encounter: Payer: Self-pay | Admitting: Nurse Practitioner

## 2020-11-27 ENCOUNTER — Ambulatory Visit: Payer: 59 | Admitting: Nurse Practitioner

## 2020-11-27 ENCOUNTER — Other Ambulatory Visit: Payer: Self-pay

## 2020-11-27 ENCOUNTER — Encounter: Payer: Self-pay | Admitting: Nurse Practitioner

## 2020-11-27 VITALS — BP 118/78 | HR 88 | Temp 98.6°F | Ht 68.4 in | Wt 192.5 lb

## 2020-11-27 DIAGNOSIS — E669 Obesity, unspecified: Secondary | ICD-10-CM

## 2020-11-27 DIAGNOSIS — G8929 Other chronic pain: Secondary | ICD-10-CM

## 2020-11-27 DIAGNOSIS — M545 Low back pain, unspecified: Secondary | ICD-10-CM | POA: Diagnosis not present

## 2020-11-27 MED ORDER — OZEMPIC (1 MG/DOSE) 2 MG/1.5ML ~~LOC~~ SOPN
1.0000 mg | PEN_INJECTOR | SUBCUTANEOUS | 2 refills | Status: DC
Start: 2020-11-27 — End: 2020-12-09

## 2020-11-27 MED ORDER — NORETHINDRONE ACET-ETHINYL EST 1-20 MG-MCG PO TABS: 1.0000 | ORAL_TABLET | Freq: Every day | ORAL | 1 refills | Status: DC

## 2020-11-27 NOTE — Assessment & Plan Note (Signed)
Patient maintained on Ozempic 1 mg weekly well -has had great success with weight loss.  She is still working on eating slower and drinking more water.  Plan to continue Ozempic 1 mg weekly.  Last kidney function in and A1c showed no more prediabetes and kidney function is stable.  Follow-up in 2 months for physical.

## 2020-11-27 NOTE — Assessment & Plan Note (Signed)
Chronic, stable.  Continues as needed use of Flexeril 5 mg.  Encouraged continued stretching/yoga and hydration with plenty of water.  Follow-up if pain worsens or persist.

## 2020-11-27 NOTE — Progress Notes (Signed)
Subjective:    Patient ID: Dominique Davis, female    DOB: 1985/07/03, 35 y.o.   MRN: [id number]  HPI: Dominique Davis is a 35 y.o. female presenting for follow-up.  Chief Complaint  Patient presents with   Back Pain    Sciatica follow up    Weight Loss    1mg  ozempic refill   BACK PAIN Duration: Chronic Mechanism of injury: Stress Location: Upper back mostly on left under shoulder blade Onset: gradual Severity: Moderate to severe Quality: Sharp Frequency: Intermittent Radiation: none Aggravating factors: Stress Alleviating factors: Stretching, yoga Status: Stable Treatments attempted: Cyclobenzaprine Relief with NSAIDs?: no Nighttime pain: No-pain is worse when waking up after sleeping Paresthesias / decreased sensation:  no Bowel / bladder incontinence:  no Fevers:  no Dysuria / urinary frequency:  no  WEIGHT GAIN Previous attempts at weight loss: yes Complications of obesity: previously prediabetes Peak weight : 240 lbs Weight loss goal: TBD Weight loss to date: ~50 lbs Requesting obesity pharmacotherapy:  Currently on Ozempic. Current weight loss supplements/medications: no Previous weight loss supplements/meds: yes  Allergies  Allergen Reactions   Black Pepper [Piper] Rash    Outpatient Encounter Medications as of 11/27/2020  Medication Sig   cyclobenzaprine (FLEXERIL) 5 MG tablet Take 1 tablet (5 mg total) by mouth 3 (three) times daily as needed for muscle spasms.   EPINEPHrine 0.3 mg/0.3 mL IJ SOAJ injection INJECT 0.3 ML (0.3 MG) INTO THE MUSCLE ONCE AS NEEDED AS DIRECTED   ferrous sulfate 324 MG TBEC Take 324 mg by mouth.   fluticasone (FLONASE) 50 MCG/ACT nasal spray Place 2 sprays into both nostrils daily.   hydrOXYzine (ATARAX/VISTARIL) 25 MG tablet TAKE 1 TO 2 TABLETS BY  MOUTH DAILY AS NEEDED. FOR  SEVERE ANXIETY ONLY   naproxen (NAPROSYN) 500 MG tablet Take 1 tablet (500 mg total) by mouth 2 (two) times daily with a meal.   Omega-3 Fatty  Acids (FISH OIL PO) Take by mouth daily.   Probiotic Product (CULTURELLE PROBIOTICS PO) Take by mouth daily.   RESTASIS 0.05 % ophthalmic emulsion    sertraline (ZOLOFT) 100 MG tablet TAKE 2 TABLETS BY MOUTH  DAILY   sodium chloride (OCEAN) 0.65 % SOLN nasal spray Place 1 spray into both nostrils as needed for congestion.   [DISCONTINUED] norethindrone-ethinyl estradiol (LOESTRIN) 1-20 MG-MCG tablet Take 1 tablet by mouth daily.   [DISCONTINUED] Semaglutide, 1 MG/DOSE, (OZEMPIC, 1 MG/DOSE,) 2 MG/1.5ML SOPN Inject 1 mg into the skin once a week.   [DISCONTINUED] traZODone (DESYREL) 100 MG tablet Take 1-2 tablets (100-200 mg total) by mouth at bedtime as needed for sleep.   norethindrone-ethinyl estradiol (LOESTRIN) 1-20 MG-MCG tablet Take 1 tablet by mouth daily.   Semaglutide, 1 MG/DOSE, (OZEMPIC, 1 MG/DOSE,) 2 MG/1.5ML SOPN Inject 1 mg into the skin once a week.   No facility-administered encounter medications on file as of 11/27/2020.    Patient Active Problem List   Diagnosis Date Noted   PCOS (polycystic ovarian syndrome) 09/02/2020   Mild hyperlipidemia 03/18/2020   Pre-diabetes 03/18/2020   Skin-picking disorder 10/08/2019   PTSD (post-traumatic stress disorder) 08/07/2019   Bloating 05/10/2019   Abdominal pain 05/10/2019   Small intestinal bacterial overgrowth 05/10/2019   Encounter for insertion of mirena IUD 03/01/2016   Insomnia 10/05/2015   MDD (major depressive disorder) 10/05/2015   Food allergy 03/04/2015   Chronic knee pain 03/31/2014   Environmental allergies 10/16/2013   Gluten intolerance 10/16/2013   Migraine headache 04/27/2013  GAD (generalized anxiety disorder) 04/27/2013   Chronic back pain 04/27/2013   Class 1 obesity 08/30/2011    Past Medical History:  Diagnosis Date   Anxiety    Depression    Dysmenorrhea    Loss of weight 11/21/2013   Migraines    Obesity    Small intestinal bacterial overgrowth AUG 2015   CIP/FLAG X7 DAYS(2X 2015)     Relevant past medical, surgical, family and social history reviewed and updated as indicated. Interim medical history since our last visit reviewed.  Review of Systems Per HPI unless specifically indicated above     Objective:    BP 118/78   Pulse 88   Temp 98.6 F (37 C)   Ht 5' 8.4" (1.737 m)   Wt 192 lb 8 oz (87.3 kg)   LMP 07/17/2020 Comment: birth control  SpO2 98%   BMI 28.93 kg/m   Wt Readings from Last 3 Encounters:  11/27/20 192 lb 8 oz (87.3 kg)  10/13/20 202 lb (91.6 kg)  10/01/20 204 lb (92.5 kg)    Physical Exam Vitals and nursing note reviewed.  Constitutional:      General: She is not in acute distress.    Appearance: Normal appearance. She is not toxic-appearing.  HENT:     Head: Normocephalic and atraumatic.     Right Ear: External ear normal.     Left Ear: External ear normal.  Cardiovascular:     Rate and Rhythm: Normal rate and regular rhythm.     Heart sounds: Normal heart sounds. No murmur heard. Pulmonary:     Effort: Pulmonary effort is normal. No respiratory distress.     Breath sounds: Normal breath sounds. No wheezing, rhonchi or rales.  Abdominal:     General: Abdomen is flat. Bowel sounds are normal.     Palpations: Abdomen is soft.  Skin:    General: Skin is warm and dry.     Capillary Refill: Capillary refill takes less than 2 seconds.     Coloration: Skin is not jaundiced or pale.     Findings: No erythema.  Neurological:     Mental Status: She is alert and oriented to person, place, and time.     Motor: No weakness.     Gait: Gait normal.  Psychiatric:        Mood and Affect: Mood normal.        Behavior: Behavior normal.        Thought Content: Thought content normal.        Judgment: Judgment normal.    Results for orders placed or performed in visit on 09/02/20  Basic metabolic panel  Result Value Ref Range   Glucose 78    BUN 12 4 - 21   CO2 19 13 - 22   Creatinine 1.0 0.5 - 1.1   Potassium 4.2 3.4 - 5.3    Sodium 139 137 - 147   Chloride 100 99 - 108  Comprehensive metabolic panel  Result Value Ref Range   Globulin 2.4    GFR calc non Af Amer 77    Calcium 9.2 8.7 - 10.7   Albumin 4.7 3.5 - 5.0  Hepatic function panel  Result Value Ref Range   Alkaline Phosphatase 37 25 - 125   ALT 10 7 - 35   AST 12 (A) 13 - 35  Hemoglobin A1c  Result Value Ref Range   Hemoglobin A1C 5.0       Assessment & Plan:  Problem List Items Addressed This Visit       Other   Class 1 obesity - Primary    Patient maintained on Ozempic 1 mg weekly well -has had great success with weight loss.  She is still working on eating slower and drinking more water.  Plan to continue Ozempic 1 mg weekly.  Last kidney function in and A1c showed no more prediabetes and kidney function is stable.  Follow-up in 2 months for physical.       Chronic back pain    Chronic, stable.  Continues as needed use of Flexeril 5 mg.  Encouraged continued stretching/yoga and hydration with plenty of water.  Follow-up if pain worsens or persist.         Follow up plan: Return in about 2 months (around 01/27/2021) for CPE .

## 2020-11-30 ENCOUNTER — Telehealth: Payer: Self-pay | Admitting: *Deleted

## 2020-11-30 NOTE — Telephone Encounter (Signed)
Received request from pharmacy for PA on Ozempic.   PA submitted.   Dx: E88.9- metabolic dysfunction, E28.2- PCOS  [facility] is reviewing your PA request. Typically an electronic response will be received within 24-72 hours.

## 2020-12-01 NOTE — Telephone Encounter (Signed)
Received PA determination.   PA denied.   Appeal faxed.  

## 2020-12-02 ENCOUNTER — Encounter: Payer: Self-pay | Admitting: Nurse Practitioner

## 2020-12-08 ENCOUNTER — Telehealth: Payer: Self-pay

## 2020-12-08 NOTE — Telephone Encounter (Signed)
PA is pending Dominique Davis (Key: [id number]) Rx #: [id number] Ozempic (1 MG/DOSE) 4MG pen-injectors

## 2020-12-09 ENCOUNTER — Other Ambulatory Visit: Payer: Self-pay | Admitting: *Deleted

## 2020-12-09 ENCOUNTER — Other Ambulatory Visit: Payer: Self-pay | Admitting: Psychiatry

## 2020-12-09 DIAGNOSIS — F4310 Post-traumatic stress disorder, unspecified: Secondary | ICD-10-CM

## 2020-12-09 DIAGNOSIS — F424 Excoriation (skin-picking) disorder: Secondary | ICD-10-CM

## 2020-12-09 DIAGNOSIS — F331 Major depressive disorder, recurrent, moderate: Secondary | ICD-10-CM

## 2020-12-09 MED ORDER — SEMAGLUTIDE (1 MG/DOSE) 4 MG/3ML ~~LOC~~ SOPN: 1.0000 mg | PEN_INJECTOR | SUBCUTANEOUS | 11 refills | Status: DC

## 2020-12-09 MED ORDER — SEMAGLUTIDE (1 MG/DOSE) 4 MG/3ML ~~LOC~~ SOPN: 1.0000 mg | PEN_INJECTOR | SUBCUTANEOUS | 3 refills | Status: DC

## 2020-12-09 MED ORDER — NORETHINDRONE ACET-ETHINYL EST 1-20 MG-MCG PO TABS: 1.0000 | ORAL_TABLET | Freq: Every day | ORAL | 1 refills | Status: DC

## 2020-12-09 NOTE — Telephone Encounter (Signed)
Received fax determination.   This medication or product was previously approved on [id number] from 01/23/2020- 01/22/2021. You will be able to fill a prescription for this medication at your pharmacy. If your pharmacy has questions regarding the processing of your prescription, please have them call the [facility] pharmacy help desk at [phone].   Received return call from [employer]. Pharmacy reports that medication can not be filled at retail pharmacy. Insurance requires refills through mail order.   Call placed to [facility]. Was advised that the patient has not set up her mail order and will need to contact them to get medication. Was also advised that shipping can be expedited to 3 day delivery.   Patient made aware.

## 2020-12-10 ENCOUNTER — Telehealth: Payer: Self-pay

## 2020-12-10 NOTE — Telephone Encounter (Signed)
Called, lm for pt to cb . Pt needs to call her ins to see what med will be covered for weight loss. The ozempic is not being covered or recognized as weight loss drug.

## 2020-12-30 ENCOUNTER — Encounter: Payer: Self-pay | Admitting: Nurse Practitioner

## 2020-12-30 NOTE — Telephone Encounter (Signed)
Can you please print these attachments and place them on my desk for me to fill out?  Thank you .

## 2020-12-31 ENCOUNTER — Telehealth: Payer: Self-pay

## 2020-12-31 NOTE — Telephone Encounter (Signed)
Pt has tested positive for covid. FMLA forms have been submitted for pcp completion. Forms have been placed in pcp folder.

## 2021-01-01 ENCOUNTER — Telehealth: Payer: Self-pay

## 2021-01-01 NOTE — Telephone Encounter (Signed)
Fmla forms have been copied, copy for pt at front office. Called pt, lm to have someone pick up forms. Pt has covid. Forms are considered incomplete for faxing. pt must sign and fill in employee info

## 2021-01-06 NOTE — Telephone Encounter (Signed)
Please print attached papers and include and fax to [employer] group for [employer].  Please also have patient call for co-pay.

## 2021-01-14 ENCOUNTER — Other Ambulatory Visit: Payer: Self-pay | Admitting: Psychiatry

## 2021-01-14 DIAGNOSIS — F331 Major depressive disorder, recurrent, moderate: Secondary | ICD-10-CM

## 2021-01-14 DIAGNOSIS — F4310 Post-traumatic stress disorder, unspecified: Secondary | ICD-10-CM

## 2021-01-14 DIAGNOSIS — F424 Excoriation (skin-picking) disorder: Secondary | ICD-10-CM

## 2021-01-24 NOTE — Progress Notes (Addendum)
BP 108/62   Pulse 89   Temp 98.3 F (36.8 C) (Oral)   Ht 5\' 7"  (1.702 m)   Wt 190 lb 6.4 oz (86.4 kg)   SpO2 99%   BMI 29.82 kg/m    Subjective:    Patient ID: , female    DOB: 1986/03/26, 35 y.o.   MRN: [id number]  HPI: Dominique Davis is a 35 y.o. female presenting on 01/25/2021 for comprehensive medical examination. Current medical complaints include:  Feels semaglutide is working very well for her for weight loss.  She is interested in increasing to 1.7 mg.  She reports a lot of loose skin and is planning to start weight lifting.  She stretches and does foam rolling daily before bed.  She is also following closely with counselor for mental health needs.  Reports this helps.    Wondering if she may have POTS - when she bends down and stands up slowly, she gets dizzy and feels her heart rate goes up. She feels flushed and her vision darkens slightly and then comes back.  She never loses consciouness.  She currently lives with: boyfriend; 2 dogs  LMP: irregular, couple of months ago   The patient does not have a history of falls. I did not complete a risk assessment for falls. A plan of care for falls was not documented.   Past Medical History:  Past Medical History:  Diagnosis Date   Anxiety    Depression    Dysmenorrhea    Loss of weight 11/21/2013   Migraines    Obesity    Small intestinal bacterial overgrowth AUG 2015   CIP/FLAG X7 DAYS(2X 2015)    Surgical History:  Past Surgical History:  Procedure Laterality Date   BACTERIAL OVERGROWTH TEST N/A 01/03/2014   CANCELLED   BACTERIAL OVERGROWTH TEST N/A 01/10/2014   0 PPM AT START, 65 PPM(75 MINS), 58 PPM(105 MINS)   WISDOM TOOTH EXTRACTION     x2    Medications:  Current Outpatient Medications on File Prior to Visit  Medication Sig   cyclobenzaprine (FLEXERIL) 5 MG tablet Take 1 tablet (5 mg total) by mouth 3 (three) times daily as needed for muscle spasms.   EPINEPHrine 0.3 mg/0.3 mL IJ SOAJ  injection INJECT 0.3 ML (0.3 MG) INTO THE MUSCLE ONCE AS NEEDED AS DIRECTED   ferrous sulfate 324 MG TBEC Take 324 mg by mouth.   fluticasone (FLONASE) 50 MCG/ACT nasal spray Place 2 sprays into both nostrils daily.   hydrOXYzine (ATARAX/VISTARIL) 25 MG tablet TAKE 1 TO 2 TABLETS BY  MOUTH DAILY AS NEEDED. FOR  SEVERE ANXIETY ONLY   naproxen (NAPROSYN) 500 MG tablet Take 1 tablet (500 mg total) by mouth 2 (two) times daily with a meal.   norethindrone-ethinyl estradiol (LOESTRIN) 1-20 MG-MCG tablet Take 1 tablet by mouth daily.   Omega-3 Fatty Acids (FISH OIL PO) Take by mouth daily.   Probiotic Product (CULTURELLE PROBIOTICS PO) Take by mouth daily.   RESTASIS 0.05 % ophthalmic emulsion    Semaglutide, 1 MG/DOSE, 4 MG/3ML SOPN Inject 1 mg into the skin once a week.   sertraline (ZOLOFT) 100 MG tablet TAKE 2 TABLETS BY MOUTH  DAILY   sodium chloride (OCEAN) 0.65 % SOLN nasal spray Place 1 spray into both nostrils as needed for congestion.   No current facility-administered medications on file prior to visit.    Allergies:  Allergies  Allergen Reactions   Black Pepper [Piper] Rash  Social History:  Social History   Socioeconomic History   Marital status: Media planner    Spouse name: Not on file   Number of children: Not on file   Years of education: Not on file   Highest education level: Not on file  Occupational History   Not on file  Tobacco Use   Smoking status: Never   Smokeless tobacco: Never   Tobacco comments:    Never smoked  Vaping Use   Vaping Use: Never used  Substance and Sexual Activity   Alcohol use: Yes    Comment: rare   Drug use: No   Sexual activity: Yes    Partners: Male    Birth control/protection: Pill  Other Topics Concern   Not on file  Social History Narrative   Not on file   Social Determinants of Health   Financial Resource Strain: Not on file  Food Insecurity: Not on file  Transportation Needs: Not on file  Physical Activity:  Not on file  Stress: Not on file  Social Connections: Not on file  Intimate Partner Violence: Not on file   Social History   Tobacco Use  Smoking Status Never  Smokeless Tobacco Never  Tobacco Comments   Never smoked   Social History   Substance and Sexual Activity  Alcohol Use Yes   Comment: rare    Family History:  Family History  Problem Relation Age of Onset   Diabetes Mother        gastroparesis   Hypertension Mother    Anxiety disorder Mother    Mental illness Mother        Bipolar Disorder    Alcohol abuse Mother    Depression Mother    Drug abuse Mother    Hypertension Father    Alcohol abuse Father    Diabetes Maternal Grandmother    Hypertension Maternal Grandmother    Hypertension Paternal Grandmother    Alcohol abuse Paternal Grandfather    Inflammatory bowel disease Neg Hx    Colon cancer Neg Hx     Past medical history, surgical history, medications, allergies, family history and social history reviewed with patient today and changes made to appropriate areas of the chart.   Review of Systems  Constitutional: Negative.  Negative for diaphoresis, fever and malaise/fatigue.  HENT: Negative.  Negative for hearing loss.   Eyes: Negative.  Negative for blurred vision and double vision.  Respiratory: Negative.  Negative for shortness of breath and wheezing.   Cardiovascular:  Positive for palpitations (with dizziness only). Negative for chest pain and leg swelling.  Gastrointestinal: Negative.   Musculoskeletal:  Positive for back pain (baseline) and myalgias (baseline).  Skin: Negative.  Negative for itching and rash.  Neurological:  Positive for dizziness. Negative for speech change and headaches.  Psychiatric/Behavioral: Negative.        Objective:    BP 108/62   Pulse 89   Temp 98.3 F (36.8 C) (Oral)   Ht 5\' 7"  (1.702 m)   Wt 190 lb 6.4 oz (86.4 kg)   SpO2 99%   BMI 29.82 kg/m   Wt Readings from Last 3 Encounters:  01/25/21 190 lb 6.4  oz (86.4 kg)  11/27/20 192 lb 8 oz (87.3 kg)  10/13/20 202 lb (91.6 kg)    Physical Exam Vitals and nursing note reviewed.  Constitutional:      General: She is not in acute distress.    Appearance: Normal appearance. She is normal weight. She is not  ill-appearing or toxic-appearing.  HENT:     Head: Normocephalic and atraumatic.     Right Ear: Tympanic membrane, ear canal and external ear normal. There is no impacted cerumen.     Left Ear: Tympanic membrane, ear canal and external ear normal. There is no impacted cerumen.     Nose: Nose normal. No congestion or rhinorrhea.     Mouth/Throat:     Mouth: Mucous membranes are moist.     Pharynx: Oropharynx is clear. No oropharyngeal exudate.  Eyes:     General: No scleral icterus.    Extraocular Movements: Extraocular movements intact.     Pupils: Pupils are equal, round, and reactive to light.  Neck:     Vascular: No carotid bruit.  Cardiovascular:     Rate and Rhythm: Normal rate and regular rhythm.     Pulses: Normal pulses.     Heart sounds: Normal heart sounds. No murmur heard. Pulmonary:     Effort: Pulmonary effort is normal. No respiratory distress.     Breath sounds: No wheezing or rhonchi.  Abdominal:     General: Abdomen is flat. Bowel sounds are normal. There is no distension.     Palpations: Abdomen is soft.     Tenderness: There is no abdominal tenderness.  Musculoskeletal:        General: No tenderness. Normal range of motion.     Cervical back: Normal range of motion and neck supple. No rigidity or tenderness.     Right lower leg: No edema.     Left lower leg: No edema.  Skin:    General: Skin is warm and dry.     Capillary Refill: Capillary refill takes less than 2 seconds.     Coloration: Skin is not jaundiced or pale.     Findings: No rash.  Neurological:     General: No focal deficit present.     Mental Status: She is alert and oriented to person, place, and time.     Motor: No weakness.     Gait: Gait  normal.  Psychiatric:        Mood and Affect: Mood normal.        Behavior: Behavior normal.        Thought Content: Thought content normal.        Judgment: Judgment normal.      Assessment & Plan:   Problem List Items Addressed This Visit       Other   Mild hyperlipidemia   BMI 29.0-29.9,adult    Semaglutide to 1.7 mg weekly looks like it may not be covered through her insurance.  She will call and check.  In the meantime, continue semaglutide 1 mg weekly.  Although BMI is now less than 30, we will plan to continue given dyslipidemia and history of prediabetes.  Will check fasting lipids, BMET, CBC, TSH.  Follow up in 3 months for weight loss.      Other Visit Diagnoses     Annual physical exam    -  Primary   Screening for iron deficiency anemia       Relevant Orders   CBC with Differential/Platelet   Encounter for lipid screening for cardiovascular disease       Relevant Orders   Lipid panel   Need for hepatitis C screening test       Relevant Orders   Hepatitis C antibody   Screening for metabolic disorder       Relevant Orders  Basic Metabolic Panel   Screening for thyroid disorder       Relevant Orders   TSH   Palpitations       Chronic.  Only with position changes.  Interested in having further workup.  Unable to perform EKG today - no nurses.  Referral to Cardiology placed.        Follow up plan: Return in about 3 months (around 04/27/2021) for weight loss f/u.   LABORATORY TESTING:  - Pap smear: up to date  IMMUNIZATIONS:   - Tdap: Tetanus vaccination status reviewed: last tetanus booster within 10 years. - Influenza: Postponed to flu season - Pneumovax: Not applicable - Prevnar: Not applicable - HPV: Not applicable - Zostavax vaccine: Not applicable - COVID-19 vaccine:  has had 2 doses  SCREENING: -Mammogram: Not applicable  - Colonoscopy: Not applicable  - Bone Density: Not applicable  -Hearing Test: Not applicable  -Spirometry: Not  applicable   PATIENT COUNSELING:   Advised to take 1 mg of folate supplement per day if capable of pregnancy.   Sexuality: Discussed sexually transmitted diseases, partner selection, use of condoms, avoidance of unintended pregnancy  and contraceptive alternatives.   Advised to avoid cigarette smoking.  I discussed with the patient that most people either abstain from alcohol or drink within safe limits (<=14/week and <=4 drinks/occasion for males, <=7/weeks and <= 3 drinks/occasion for females) and that the risk for alcohol disorders and other health effects rises proportionally with the number of drinks per week and how often a drinker exceeds daily limits.  Discussed cessation/primary prevention of drug use and availability of treatment for abuse.   Diet: Encouraged to adjust caloric intake to maintain  or achieve ideal body weight, to reduce intake of dietary saturated fat and total fat, to limit sodium intake by avoiding high sodium foods and not adding table salt, and to maintain adequate dietary potassium and calcium preferably from fresh fruits, vegetables, and low-fat dairy products.    stressed the importance of regular exercise  Injury prevention: Discussed safety belts, safety helmets, smoke detector, smoking near bedding or upholstery.   Dental health: Discussed importance of regular tooth brushing, flossing, and dental visits.    NEXT PREVENTATIVE PHYSICAL DUE IN 1 YEAR. Return in about 3 months (around 04/27/2021) for weight loss f/u.

## 2021-01-25 ENCOUNTER — Encounter: Payer: Self-pay | Admitting: Nurse Practitioner

## 2021-01-25 ENCOUNTER — Ambulatory Visit (INDEPENDENT_AMBULATORY_CARE_PROVIDER_SITE_OTHER): Payer: 59 | Admitting: Nurse Practitioner

## 2021-01-25 ENCOUNTER — Other Ambulatory Visit: Payer: Self-pay

## 2021-01-25 VITALS — BP 108/62 | HR 89 | Temp 98.3°F | Ht 67.0 in | Wt 190.4 lb

## 2021-01-25 DIAGNOSIS — E785 Hyperlipidemia, unspecified: Secondary | ICD-10-CM

## 2021-01-25 DIAGNOSIS — Z13228 Encounter for screening for other metabolic disorders: Secondary | ICD-10-CM

## 2021-01-25 DIAGNOSIS — Z1159 Encounter for screening for other viral diseases: Secondary | ICD-10-CM

## 2021-01-25 DIAGNOSIS — Z13220 Encounter for screening for lipoid disorders: Secondary | ICD-10-CM

## 2021-01-25 DIAGNOSIS — Z0001 Encounter for general adult medical examination with abnormal findings: Secondary | ICD-10-CM

## 2021-01-25 DIAGNOSIS — Z136 Encounter for screening for cardiovascular disorders: Secondary | ICD-10-CM

## 2021-01-25 DIAGNOSIS — Z130 Encounter for screening for diseases of the blood and blood-forming organs and certain disorders involving the immune mechanism: Secondary | ICD-10-CM

## 2021-01-25 DIAGNOSIS — Z1329 Encounter for screening for other suspected endocrine disorder: Secondary | ICD-10-CM

## 2021-01-25 DIAGNOSIS — Z0000 Encounter for general adult medical examination without abnormal findings: Secondary | ICD-10-CM

## 2021-01-25 DIAGNOSIS — R002 Palpitations: Secondary | ICD-10-CM

## 2021-01-25 DIAGNOSIS — Z6829 Body mass index (BMI) 29.0-29.9, adult: Secondary | ICD-10-CM

## 2021-01-25 NOTE — Addendum Note (Signed)
Addended by: Cathlean Marseilles A on: 01/25/2021 11:54 AM   Modules accepted: Orders

## 2021-01-25 NOTE — Assessment & Plan Note (Addendum)
Semaglutide to 1.7 mg weekly looks like it may not be covered through her insurance.  She will call and check.  In the meantime, continue semaglutide 1 mg weekly.  Although BMI is now less than 30, we will plan to continue given dyslipidemia and history of prediabetes.  Will check fasting lipids, BMET, CBC, TSH.  Follow up in 3 months for weight loss.

## 2021-01-28 LAB — BASIC METABOLIC PANEL
BUN: 10 (ref 4–21)
CO2: 23 — AB (ref 13–22)
Chloride: 99 (ref 99–108)
Creatinine: 0.9 (ref 0.5–1.1)
Glucose: 76
Potassium: 4.5 (ref 3.4–5.3)
Sodium: 137 (ref 137–147)

## 2021-01-28 LAB — CBC AND DIFFERENTIAL
HCT: 39 (ref 36–46)
Hemoglobin: 12.7 (ref 12.0–16.0)
Neutrophils Absolute: 4
Platelets: 264 (ref 150–399)
WBC: 7.1

## 2021-01-28 LAB — COMPREHENSIVE METABOLIC PANEL
Calcium: 9.4 (ref 8.7–10.7)
GFR calc non Af Amer: 83

## 2021-01-28 LAB — HM HEPATITIS C SCREENING LAB: HM Hepatitis Screen: NEGATIVE

## 2021-01-28 LAB — CBC: RBC: 4.48 (ref 3.87–5.11)

## 2021-01-28 LAB — TSH: TSH: 3.04 (ref 0.41–5.90)

## 2021-01-29 ENCOUNTER — Encounter: Payer: Self-pay | Admitting: *Deleted

## 2021-01-29 DIAGNOSIS — F331 Major depressive disorder, recurrent, moderate: Secondary | ICD-10-CM

## 2021-01-29 DIAGNOSIS — F424 Excoriation (skin-picking) disorder: Secondary | ICD-10-CM

## 2021-01-29 DIAGNOSIS — F4310 Post-traumatic stress disorder, unspecified: Secondary | ICD-10-CM

## 2021-01-29 LAB — LIPID PANEL
Cholesterol: 195 (ref 0–200)
HDL: 58 (ref 35–70)
LDL Cholesterol: 119
LDl/HDL Ratio: 3.4
Triglycerides: 103 (ref 40–160)

## 2021-02-01 ENCOUNTER — Ambulatory Visit: Payer: 59 | Admitting: Nurse Practitioner

## 2021-02-02 ENCOUNTER — Other Ambulatory Visit: Payer: Self-pay | Admitting: Psychiatry

## 2021-02-02 DIAGNOSIS — F331 Major depressive disorder, recurrent, moderate: Secondary | ICD-10-CM

## 2021-02-02 DIAGNOSIS — F4310 Post-traumatic stress disorder, unspecified: Secondary | ICD-10-CM

## 2021-02-02 DIAGNOSIS — F424 Excoriation (skin-picking) disorder: Secondary | ICD-10-CM

## 2021-02-03 NOTE — Telephone Encounter (Signed)
Yes I can take over sertraline.

## 2021-02-04 MED ORDER — SERTRALINE HCL 100 MG PO TABS: 200.0000 mg | ORAL_TABLET | Freq: Every day | ORAL | 3 refills | Status: DC

## 2021-02-11 ENCOUNTER — Encounter: Payer: Self-pay | Admitting: *Deleted

## 2021-02-11 ENCOUNTER — Telehealth: Payer: Self-pay | Admitting: *Deleted

## 2021-02-11 NOTE — Telephone Encounter (Signed)
Received PA determination.   PA denied.   Appeal faxed.  

## 2021-02-11 NOTE — Telephone Encounter (Signed)
Received request from pharmacy for PA on Ozempic.   PA submitted.   Dx: E88.9- metabolic dysfunction, E28.2- PCOS  [facility] is reviewing your PA request. Typically an electronic response will be received within 24-72 hours. 

## 2021-02-16 ENCOUNTER — Encounter: Payer: Self-pay | Admitting: Nurse Practitioner

## 2021-02-17 ENCOUNTER — Encounter: Payer: Self-pay | Admitting: *Deleted

## 2021-02-17 ENCOUNTER — Encounter: Payer: Self-pay | Admitting: Nurse Practitioner

## 2021-02-22 ENCOUNTER — Other Ambulatory Visit: Payer: Self-pay | Admitting: Nurse Practitioner

## 2021-02-23 NOTE — Telephone Encounter (Signed)
Received determination for appeal.   Appeal denied as patient does not have active Dx of DM.   Please advise.

## 2021-02-23 NOTE — Telephone Encounter (Signed)
Please notify patient.  Can we try semaglutide and see if this would make a difference since its approved for weight loss?  We can also consider liraglutide for weight loss, but this is a daily injectable and we would need to check with the patient first before starting.

## 2021-02-25 MED ORDER — SAXENDA 18 MG/3ML ~~LOC~~ SOPN: PEN_INJECTOR | SUBCUTANEOUS | 3 refills | Status: DC

## 2021-02-25 NOTE — Addendum Note (Signed)
Addended by: Phillips Odor on: 02/25/2021 08:03 AM   Modules accepted: Orders

## 2021-02-26 ENCOUNTER — Telehealth: Payer: Self-pay | Admitting: *Deleted

## 2021-02-26 NOTE — Telephone Encounter (Signed)
Received request from pharmacy for PA on Saxenda.  PA submitted.   Dx: E66.09- obesity.  Your information has been sent to [facility].

## 2021-03-01 NOTE — Telephone Encounter (Signed)
Received PA determination.   PA denied.   This drug is covered if the patient meets the following: (1) the patient is not taking this drug with any of the following: Liraglutide-containing products (for example: Victoza) and glucagon-like peptide-1 receptor agonists (for example: Ozempic, Rybelsus, Trulicity, [id number]).  Appeal faxed.

## 2021-03-02 MED ORDER — SAXENDA 18 MG/3ML ~~LOC~~ SOPN: PEN_INJECTOR | SUBCUTANEOUS | 3 refills | Status: DC

## 2021-03-02 MED ORDER — INSULIN PEN NEEDLE 32G X 4 MM MISC: 1 refills | Status: DC

## 2021-03-02 NOTE — Addendum Note (Signed)
Addended by: Phillips Odor on: 03/02/2021 12:45 PM   Modules accepted: Orders

## 2021-03-02 NOTE — Telephone Encounter (Signed)
Appeal approved.   APP- [id number] approved 02/26/2021- 08/30/2021.

## 2021-04-19 ENCOUNTER — Ambulatory Visit: Payer: 59 | Admitting: Nurse Practitioner

## 2021-04-19 ENCOUNTER — Other Ambulatory Visit: Payer: Self-pay

## 2021-04-19 ENCOUNTER — Encounter: Payer: Self-pay | Admitting: Nurse Practitioner

## 2021-04-19 VITALS — BP 122/78 | HR 84 | Temp 97.3°F | Ht 67.0 in | Wt 193.4 lb

## 2021-04-19 DIAGNOSIS — M545 Low back pain, unspecified: Secondary | ICD-10-CM

## 2021-04-19 DIAGNOSIS — G8929 Other chronic pain: Secondary | ICD-10-CM | POA: Diagnosis not present

## 2021-04-19 DIAGNOSIS — E785 Hyperlipidemia, unspecified: Secondary | ICD-10-CM | POA: Diagnosis not present

## 2021-04-19 DIAGNOSIS — Z23 Encounter for immunization: Secondary | ICD-10-CM | POA: Diagnosis not present

## 2021-04-19 DIAGNOSIS — Z6830 Body mass index (BMI) 30.0-30.9, adult: Secondary | ICD-10-CM

## 2021-04-19 NOTE — Assessment & Plan Note (Addendum)
Chronic.  BMI > 30, hyperlipidemia will plan to continue liraglutide 3 mg daily.  Recent kidney function in the past 6 months normal.  Check HgbA1c.  Continue dietary and lifestyle changes as well including 30 minutes of physical activity 5 times weekly and eating meals high in lean protein.  Follow up in 3 months.

## 2021-04-19 NOTE — Assessment & Plan Note (Signed)
Chronic.  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  I wrote a work excuse for her today to not pull or push on machinery heavier than 50 lbs.  Continue as needed Flexeril 5 mg.  Also continue massages - I also wrote a note for this to see if insurance can help cover the cost.  Follow up if pain worsens.

## 2021-04-19 NOTE — Assessment & Plan Note (Signed)
Recent LDL 119.  Goal less than 100.  Patient is working on dietary and lifestyle changes to help lower LDL and reduce future CV risk.

## 2021-04-19 NOTE — Progress Notes (Signed)
Subjective:    Patient ID: Dominique Davis, female    DOB: [birth year], 35 y.o.   MRN: [id number]  HPI: Dominique Davis is a 35 y.o. female presenting for follow up.  Chief Complaint  Patient presents with   Follow-up    Follow up flu shot    ELEVATED BMI: 30 Duration: chronic Previous attempts at weight loss: yes Complications of obesity: hyperlipidemia Peak weight: 240 Weight loss to date: ~50 lbs Requesting obesity pharmacotherapy: yes Current weight loss supplements/medications: liraglutide 3 mg daily Previous weight loss supplements/meds: yes; semaglutide Water intake: trying to get in 64 oz daily  Patient reports she has changed to first shift at work.  This has been an adjustment for her, but she is excited about it and had been waiting for this role to open ip.  She now works 0430 am - 1 pm.  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  She is requesting a note for work today requesting she not have to do this.  She also started getting massages this month which has been beneficial for her chronic pain.  She is wondering if I can write a note for her insurance so they can be covered.  She has chronic back pain and does think this would be beneficial for her long-term.  Has appointment with Cardiologist next week.     Allergies  Allergen Reactions   Black Pepper [Piper] Rash    Outpatient Encounter Medications as of 04/19/2021  Medication Sig   cyclobenzaprine (FLEXERIL) 5 MG tablet Take 1 tablet (5 mg total) by mouth 3 (three) times daily as needed for muscle spasms.   EPINEPHrine 0.3 mg/0.3 mL IJ SOAJ injection INJECT 0.3 ML (0.3 MG) INTO THE MUSCLE ONCE AS NEEDED AS DIRECTED   ferrous sulfate 324 MG TBEC Take 324 mg by mouth.   fluticasone (FLONASE) 50 MCG/ACT nasal spray Place 2 sprays into both nostrils daily.   hydrOXYzine (ATARAX/VISTARIL) 25 MG tablet TAKE 1 TO 2  TABLETS BY  MOUTH DAILY AS NEEDED. FOR  SEVERE ANXIETY ONLY   Insulin Pen Needle 32G X 4 MM MISC Use as directed to inject Saxenda SQ QD.   Liraglutide -Weight Management (SAXENDA) 18 MG/3ML SOPN Inject 0.6mg  SQ QD. Increase dose by 0.6mg  QW until max dose of 3mg  SQ QD is reached.   naproxen (NAPROSYN) 500 MG tablet Take 1 tablet (500 mg total) by mouth 2 (two) times daily with a meal.   norethindrone-ethinyl estradiol (LOESTRIN) 1-20 MG-MCG tablet Take 1 tablet by mouth daily.   Omega-3 Fatty Acids (FISH OIL PO) Take by mouth daily.   Probiotic Product (CULTURELLE PROBIOTICS PO) Take by mouth daily.   RESTASIS 0.05 % ophthalmic emulsion    sertraline (ZOLOFT) 100 MG tablet Take 2 tablets (200 mg total) by mouth daily.   sodium chloride (OCEAN) 0.65 % SOLN nasal spray Place 1 spray into both nostrils as needed for congestion.   No facility-administered encounter medications on file as of 04/19/2021.    Patient Active Problem List   Diagnosis Date Noted   PCOS (polycystic ovarian syndrome) 09/02/2020   Mild hyperlipidemia 03/18/2020   Pre-diabetes 03/18/2020   Skin-picking disorder 10/08/2019   PTSD (post-traumatic stress disorder) 08/07/2019   Bloating 05/10/2019   Abdominal pain 05/10/2019   Small intestinal bacterial overgrowth 05/10/2019   Encounter for insertion of mirena IUD 03/01/2016   Insomnia  10/05/2015   MDD (major depressive disorder) 10/05/2015   Food allergy 03/04/2015   Chronic knee pain 03/31/2014   Environmental allergies 10/16/2013   Gluten intolerance 10/16/2013   Migraine headache 04/27/2013   GAD (generalized anxiety disorder) 04/27/2013   Chronic back pain 04/27/2013   BMI 29.0-29.9,adult 08/30/2011    Past Medical History:  Diagnosis Date   Anxiety    Depression    Dysmenorrhea    Loss of weight 11/21/2013   Migraines    Obesity    Small intestinal bacterial overgrowth AUG 2015   CIP/FLAG X7 DAYS(2X 2015)    Relevant past medical, surgical, family  and social history reviewed and updated as indicated. Interim medical history since our last visit reviewed.  Review of Systems Per HPI unless specifically indicated above     Objective:    BP 122/78   Pulse 84   Temp (!) 97.3 F (36.3 C)   Ht 5\' 7"  (1.702 m)   Wt 193 lb 6.4 oz (87.7 kg)   LMP  (LMP Unknown)   SpO2 96%   BMI 30.29 kg/m   Wt Readings from Last 3 Encounters:  04/19/21 193 lb 6.4 oz (87.7 kg)  01/25/21 190 lb 6.4 oz (86.4 kg)  11/27/20 192 lb 8 oz (87.3 kg)    Physical Exam Vitals and nursing note reviewed.  Constitutional:      General: She is not in acute distress.    Appearance: Normal appearance. She is obese. She is not toxic-appearing.  Cardiovascular:     Rate and Rhythm: Normal rate and regular rhythm.     Heart sounds: Normal heart sounds. No murmur heard. Pulmonary:     Effort: Pulmonary effort is normal. No respiratory distress.     Breath sounds: Normal breath sounds. No wheezing, rhonchi or rales.  Musculoskeletal:     Right lower leg: No edema.     Left lower leg: No edema.  Skin:    General: Skin is warm and dry.     Coloration: Skin is not jaundiced or pale.     Findings: No erythema.  Neurological:     Mental Status: She is alert and oriented to person, place, and time.     Motor: No weakness.     Gait: Gait normal.  Psychiatric:        Mood and Affect: Mood normal.        Behavior: Behavior normal.        Thought Content: Thought content normal.        Judgment: Judgment normal.      Assessment & Plan:   Problem List Items Addressed This Visit       Other   Mild hyperlipidemia    Recent LDL 119.  Goal less than 100.  Patient is working on dietary and lifestyle changes to help lower LDL and reduce future CV risk.        Chronic back pain    Chronic.  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  I wrote a work excuse for her today to not pull or push on machinery heavier than 50 lbs.   Continue as needed Flexeril 5 mg.  Also continue massages - I also wrote a note for this to see if insurance can help cover the cost.  Follow up if pain worsens.      BMI 29.0-29.9,adult - Primary    Chronic.  BMI > 30, hyperlipidemia will plan to  continue liraglutide 3 mg daily.  Recent kidney function in the past 6 months normal.  Check HgbA1c.  Continue dietary and lifestyle changes as well including 30 minutes of physical activity 5 times weekly and eating meals high in lean protein.  Follow up in 3 months.        Other Visit Diagnoses     Need for immunization against influenza       Relevant Orders   Flu Vaccine QUAD 26mo+IM (Fluarix, Fluzone & Alfiuria Quad PF) (Completed)        Follow up plan: Return in about 3 months (around 07/20/2021) for weight loss follow up.

## 2021-04-28 ENCOUNTER — Ambulatory Visit: Payer: 59 | Admitting: Internal Medicine

## 2021-04-28 ENCOUNTER — Other Ambulatory Visit: Payer: Self-pay

## 2021-04-28 ENCOUNTER — Ambulatory Visit (INDEPENDENT_AMBULATORY_CARE_PROVIDER_SITE_OTHER): Payer: 59

## 2021-04-28 ENCOUNTER — Encounter: Payer: Self-pay | Admitting: Internal Medicine

## 2021-04-28 VITALS — BP 108/64 | HR 84 | Ht 67.0 in | Wt 197.0 lb

## 2021-04-28 DIAGNOSIS — R55 Syncope and collapse: Secondary | ICD-10-CM

## 2021-04-28 DIAGNOSIS — R002 Palpitations: Secondary | ICD-10-CM

## 2021-04-28 NOTE — Progress Notes (Signed)
OFFICE CONSULT NOTE  Chief Complaint:  Near syncope, heart racing  Primary Care Physician: Valentino Nose, NP  HPI:  Dominique Davis is a 35 y.o. female who is being seen today for the evaluation of near syncope, heart racing at the request of Valentino Nose, NP.  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  She reports that she has been having episodes where she feels fatigued and that she gets lightheaded and may pass out particularly with squatting down or changing position.  She does run a low blood pressure at rest.  Today she was just over 100 systolic.  I did perform orthostatics in the office today were negative.  Family history includes MI in her mother.  She denies any chest pain.  She is concerned she might have POTS.  She says she has a diagnosis of fibromyalgia, anxiety and depression and is undergoing therapy.  She is also been diagnosed with SIBO, migraines and recently lost a significant amount of weight.  She was actually referred for palpitations although really notes that her heart races.  This is oftentimes inappropriately with exertion and not necessarily at rest.  EKG here is normal.  PMHx:  Past Medical History:  Diagnosis Date   Anxiety    Depression    Dysmenorrhea    Loss of weight 11/21/2013   Migraines    Obesity    Small intestinal bacterial overgrowth AUG 2015   CIP/FLAG X7 DAYS(2X 2015)    Past Surgical History:  Procedure Laterality Date   BACTERIAL OVERGROWTH TEST N/A 01/03/2014   CANCELLED   BACTERIAL OVERGROWTH TEST N/A 01/10/2014   0 PPM AT START, 65 PPM(75 MINS), 58 PPM(105 MINS)   WISDOM TOOTH EXTRACTION     x2    FAMHx:  Family History  Problem Relation Age of Onset   Diabetes Mother        gastroparesis   Hypertension Mother    Anxiety disorder Mother    Mental illness Mother        Bipolar Disorder    Alcohol abuse Mother    Depression Mother    Drug abuse Mother    Hypertension Father     Alcohol abuse Father    Diabetes Maternal Grandmother    Hypertension Maternal Grandmother    Hypertension Paternal Grandmother    Alcohol abuse Paternal Grandfather    Inflammatory bowel disease Neg Hx    Colon cancer Neg Hx     SOCHx:   reports that she has never smoked. She has never used smokeless tobacco. She reports current alcohol use. She reports that she does not use drugs.  ALLERGIES:  Allergies  Allergen Reactions   Black Pepper [Piper] Rash    ROS: Pertinent items noted in HPI and remainder of comprehensive ROS otherwise negative.  HOME MEDS: Current Outpatient Medications on File Prior to Visit  Medication Sig Dispense Refill   cyclobenzaprine (FLEXERIL) 5 MG tablet Take 1 tablet (5 mg total) by mouth 3 (three) times daily as needed for muscle spasms. 30 tablet 1   EPINEPHrine 0.3 mg/0.3 mL IJ SOAJ injection INJECT 0.3 ML (0.3 MG) INTO THE MUSCLE ONCE AS NEEDED AS DIRECTED 1 each 1   ferrous sulfate 324 MG TBEC Take 324 mg by mouth.     fluticasone (FLONASE) 50 MCG/ACT nasal spray Place 2 sprays into both nostrils daily. 16 g 6   hydrOXYzine (ATARAX/VISTARIL) 25 MG tablet TAKE 1 TO 2 TABLETS BY  MOUTH DAILY AS NEEDED. FOR  SEVERE ANXIETY ONLY 180 tablet 1   Insulin Pen Needle 32G X 4 MM MISC Use as directed to inject Saxenda SQ QD. 100 each 1   Liraglutide -Weight Management (SAXENDA) 18 MG/3ML SOPN Inject 0.6mg  SQ QD. Increase dose by 0.6mg  QW until max dose of 3mg  SQ QD is reached. 15 mL 3   naproxen (NAPROSYN) 500 MG tablet Take 1 tablet (500 mg total) by mouth 2 (two) times daily with a meal. 30 tablet 0   norethindrone-ethinyl estradiol (LOESTRIN) 1-20 MG-MCG tablet Take 1 tablet by mouth daily. 90 tablet 1   Omega-3 Fatty Acids (FISH OIL PO) Take by mouth daily.     Probiotic Product (CULTURELLE PROBIOTICS PO) Take by mouth daily.     RESTASIS 0.05 % ophthalmic emulsion      sertraline (ZOLOFT) 100 MG tablet Take 2 tablets (200 mg total) by mouth daily. 180  tablet 3   sodium chloride (OCEAN) 0.65 % SOLN nasal spray Place 1 spray into both nostrils as needed for congestion. 88 mL 0   No current facility-administered medications on file prior to visit.    LABS/IMAGING: No results found for this or any previous visit (from the past 48 hour(s)). No results found.  LIPID PANEL:    Component Value Date/Time   CHOL 195 01/28/2021 0000   CHOL 118 01/18/2018 1014   TRIG 103 01/28/2021 0000   HDL 58 01/28/2021 0000   HDL 48 01/18/2018 1014   CHOLHDL 2.5 01/18/2018 1014   CHOLHDL 3.1 06/14/2013 0840   VLDL 15 06/14/2013 0840   LDLCALC 119 01/28/2021 0000   LDLCALC 60 01/18/2018 1014    WEIGHTS: Wt Readings from Last 3 Encounters:  04/28/21 197 lb (89.4 kg)  04/19/21 193 lb 6.4 oz (87.7 kg)  01/25/21 190 lb 6.4 oz (86.4 kg)    VITALS: BP 108/64   Pulse 84   Ht 5\' 7"  (1.702 m)   Wt 197 lb (89.4 kg)   LMP  (LMP Unknown)   SpO2 97%   BMI 30.85 kg/m   EXAM: General appearance: alert and no distress Neck: no carotid bruit, no JVD, and thyroid not enlarged, symmetric, no tenderness/mass/nodules Lungs: clear to auscultation bilaterally Heart: regular rate and rhythm, S1, S2 normal, no murmur, click, rub or gallop Abdomen: soft, non-tender; bowel sounds normal; no masses,  no organomegaly Extremities: extremities normal, atraumatic, no cyanosis or edema Pulses: 2+ and symmetric Skin: Skin color, texture, turgor normal. No rashes or lesions Neurologic: Grossly normal : Pleasant  EKG: NSR at 92 - personally reviewed  ASSESSMENT: Fatigue, near syncope, tachycardia  Palpitations  PLAN: 1.   [name] is describing some fatigue, near syncope and tachycardia.  She is suspect that she might have POTS syndrome, which is actually a reasonable diagnosis.  Testing today however failed to reveal any orthostasis.  Nonetheless because she runs a lower blood pressure will be reasonable for her to try to stay well-hydrated, wear lower  extremity compression stockings and liberalize sodium in her diet.  This may help with some of her positional dizziness/near syncope.  I think her tachycardia may be inappropriate.  We will proceed with an echocardiogram to evaluate for any structural abnormalities and place a ZIO monitor to see if we can pick up on any palpitations.  Plan follow-up with me afterwards to discuss the findings.  Thanks again for the kind referral.  , MD, Robb Matar  [place]  [facility]  Medical Director of the [facility] Diplomate of the American Board of Clinical Lipidology Attending Cardiologist  Direct Dial: [phone]  Fax: [phone]  Website:  [email or web address] Nicely Rainah Kirshner 04/28/2021, 4:22 PM

## 2021-04-28 NOTE — Progress Notes (Unsigned)
Enrolled for [employer] to mail a ZIO XT long term holter monitor to the patients address on file.  

## 2021-04-28 NOTE — Patient Instructions (Signed)
Medication Instructions:  No Changes In Medications at this time.  *If you need a refill on your cardiac medications before your next appointment, please call your pharmacy*  Testing/Procedures: Your physician has requested that you have an echocardiogram. Echocardiography is a painless test that uses sound waves to create images of your heart. It provides your doctor with information about the size and shape of your heart and how well your heart's chambers and valves are working. You may receive an ultrasound enhancing agent through an IV if needed to better visualize your heart during the echo.This procedure takes approximately one hour. There are no restrictions for this procedure.   ZIO XT- Long Term Monitor Instructions   Your physician has requested you wear your ZIO patch monitor 7 days.   This is a single patch monitor.  [employer] supplies one patch monitor per enrollment.  Additional stickers are not available.   Please do not apply patch if you will be having a Nuclear Stress Test, Echocardiogram, Cardiac CT, MRI, or Chest Xray during the time frame you would be wearing the monitor. The patch cannot be worn during these tests.  You cannot remove and re-apply the ZIO XT patch monitor.   Your ZIO patch monitor will be sent USPS Priority mail from [facility] directly to your home address. The monitor may also be mailed to a PO BOX if home delivery is not available.   It may take 3-5 days to receive your monitor after you have been enrolled.   Once you have received you monitor, please review enclosed instructions.  Your monitor has already been registered assigning a specific monitor serial # to you.   Applying the monitor   Shave hair from upper left chest.   Hold abrader disc by orange tab.  Rub abrader in 40 strokes over left upper chest as indicated in your monitor instructions.   Clean area with 4 enclosed alcohol pads .  Use all pads to assure are is cleaned thoroughly.   Let dry.   Apply patch as indicated in monitor instructions.  Patch will be place under collarbone on left side of chest with arrow pointing upward.   Rub patch adhesive wings for 2 minutes.Remove white label marked "1".  Remove white label marked "2".  Rub patch adhesive wings for 2 additional minutes.   While looking in a mirror, press and release button in center of patch.  A small green light will flash 3-4 times .  This will be your only indicator the monitor has been turned on.     Do not shower for the first 24 hours.  You may shower after the first 24 hours.   Press button if you feel a symptom. You will hear a small click.  Record Date, Time and Symptom in the Patient Log Book.   When you are ready to remove patch, follow instructions on last 2 pages of Patient Log Book.  Stick patch monitor onto last page of Patient Log Book.   Place Patient Log Book in [place] box.  Use locking tab on box and tape box closed securely.  The [employer] and [employer] has [employer] on it.  Please place in mailbox as soon as possible.  Your physician should have your test results approximately 7 days after the monitor has been mailed back to [facility].   Call [facility] Customer Care at [phone] if you have questions regarding your ZIO XT patch monitor.  Call them immediately if you see  an orange light blinking on your monitor.   If your monitor falls off in less than 4 days contact our Monitor department at [phone].  If your monitor becomes loose or falls off after 4 days call [employer] at [phone] for suggestions on securing your monitor.    Follow-Up: At [facility], you and your health needs are our priority.  As part of our continuing mission to provide you with exceptional heart care, we have created designated Provider Care Teams.  These Care Teams include your primary Cardiologist (physician) and Advanced Practice Providers (APPs -  Physician Assistants and Nurse  Practitioners) who all work together to provide you with the care you need, when you need it.  Your next appointment:   AFTER TESTING   The format for your next appointment:   In Person  Provider: Dr. Rennis Golden

## 2021-05-03 DIAGNOSIS — R55 Syncope and collapse: Secondary | ICD-10-CM | POA: Diagnosis not present

## 2021-05-03 DIAGNOSIS — R002 Palpitations: Secondary | ICD-10-CM | POA: Diagnosis not present

## 2021-05-19 ENCOUNTER — Other Ambulatory Visit: Payer: Self-pay | Admitting: Family Medicine

## 2021-05-25 ENCOUNTER — Encounter: Payer: Self-pay | Admitting: Nurse Practitioner

## 2021-05-26 NOTE — Telephone Encounter (Signed)
That is fine 

## 2021-06-06 ENCOUNTER — Other Ambulatory Visit: Payer: Self-pay | Admitting: Family Medicine

## 2021-06-10 ENCOUNTER — Other Ambulatory Visit: Payer: Self-pay

## 2021-06-10 ENCOUNTER — Ambulatory Visit (HOSPITAL_COMMUNITY)
Admission: RE | Admit: 2021-06-10 | Discharge: 2021-06-10 | Disposition: A | Discharge status: Home / Self Care | Payer: 59 | Source: Ambulatory Visit | Attending: Internal Medicine | Admitting: Internal Medicine

## 2021-06-10 DIAGNOSIS — R55 Syncope and collapse: Secondary | ICD-10-CM | POA: Diagnosis not present

## 2021-06-10 DIAGNOSIS — R002 Palpitations: Secondary | ICD-10-CM | POA: Diagnosis not present

## 2021-06-10 LAB — ECHOCARDIOGRAM COMPLETE
AR max vel: 2.29 cm2
AV Area VTI: 2.36 cm2
AV Area mean vel: 2.09 cm2
AV Mean grad: 4 mmHg
AV Peak grad: 6.5 mmHg
Ao pk vel: 1.27 m/s
Area-P 1/2: 3.54 cm2
Calc EF: 57.6 %
MV VTI: 3.31 cm2
S' Lateral: 2.5 cm
Single Plane A2C EF: 50.7 %
Single Plane A4C EF: 61.8 %

## 2021-06-10 NOTE — Progress Notes (Signed)
*  PRELIMINARY RESULTS* Echocardiogram 2D Echocardiogram has been performed.  Elpidio Anis 06/10/2021, 4:09 PM

## 2021-07-21 ENCOUNTER — Ambulatory Visit: Payer: 59 | Admitting: Nurse Practitioner

## 2021-08-10 ENCOUNTER — Ambulatory Visit: Payer: 59 | Admitting: Adult Health

## 2021-08-30 ENCOUNTER — Encounter: Payer: Self-pay | Admitting: Family

## 2021-08-30 ENCOUNTER — Ambulatory Visit: Payer: 59 | Admitting: Family

## 2021-08-30 VITALS — BP 108/68 | HR 96 | Temp 99.7°F | Ht 67.0 in | Wt 200.2 lb

## 2021-08-30 DIAGNOSIS — M25552 Pain in left hip: Secondary | ICD-10-CM
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
## 2021-08-30 NOTE — Patient Instructions (Signed)
Welcome to [facility]! It was a pleasure meeting you today. ? ?As discussed, I have sent a referral to our Orthopedic office. They will call you directly for an appointment. ?Please schedule a 3 month follow up visit today for medication refills and to see how you are doing with the Saxenda. ? ? ? ?PLEASE NOTE: ? ?If you had any LAB tests please let us know if you have not heard back within a few days. You may see your results on MyChart before we have a chance to review them but we will give you a call once they are reviewed by [place]. If we ordered any REFERRALS today, please let us know if you have not heard from their office within the next week.  ?Let us know through MyChart if you are needing REFILLS, or have your pharmacy send [place] the request. You can also use MyChart to communicate with me or any office staff. ? ?Please try these tips to maintain a healthy lifestyle: ? ?Eat most of your calories during the day when you are active. Eliminate processed foods including packaged sweets (pies, cakes, cookies), reduce intake of potatoes, white bread, white pasta, and white rice. Look for whole grain options, oat flour or almond flour. ? ?Each meal should contain half fruits/vegetables, one quarter protein, and one quarter carbs (no bigger than a computer mouse). ? ?Cut down on sweet beverages. This includes juice, soda, and sweet tea. Also watch fruit intake, though this is a healthier sweet option, it still contains natural sugar! Limit to 3 servings daily. ? ?Drink at least 1 glass of water with each meal and aim for at least 8 glasses per day ? ?Exercise at least 150 minutes every week.  ? ?

## 2021-09-07 ENCOUNTER — Ambulatory Visit (INDEPENDENT_AMBULATORY_CARE_PROVIDER_SITE_OTHER): Payer: 59 | Admitting: Orthopaedic Surgery

## 2021-09-07 ENCOUNTER — Ambulatory Visit (INDEPENDENT_AMBULATORY_CARE_PROVIDER_SITE_OTHER): Payer: 59

## 2021-09-07 ENCOUNTER — Encounter: Payer: Self-pay | Admitting: Orthopaedic Surgery

## 2021-09-07 DIAGNOSIS — M25552 Pain in left hip: Secondary | ICD-10-CM | POA: Diagnosis not present

## 2021-09-07 DIAGNOSIS — M545 Low back pain, unspecified: Secondary | ICD-10-CM | POA: Diagnosis not present

## 2021-09-07 MED ORDER — DICLOFENAC SODIUM 75 MG PO TBEC: 75.0000 mg | DELAYED_RELEASE_TABLET | Freq: Two times a day (BID) | ORAL | 2 refills | Status: DC | PRN
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
## 2021-09-19 ENCOUNTER — Encounter: Payer: Self-pay | Admitting: Family

## 2021-09-19 DIAGNOSIS — E669 Obesity, unspecified: Secondary | ICD-10-CM

## 2021-09-19 DIAGNOSIS — R635 Abnormal weight gain: Secondary | ICD-10-CM

## 2021-09-22 ENCOUNTER — Encounter: Payer: Self-pay | Admitting: Family

## 2021-09-22 DIAGNOSIS — R635 Abnormal weight gain: Secondary | ICD-10-CM | POA: Insufficient documentation

## 2021-09-22 DIAGNOSIS — E669 Obesity, unspecified: Secondary | ICD-10-CM | POA: Insufficient documentation

## 2021-09-22 MED ORDER — SAXENDA 18 MG/3ML ~~LOC~~ SOPN: PEN_INJECTOR | SUBCUTANEOUS | 1 refills | Status: DC

## 2021-09-23 NOTE — Telephone Encounter (Signed)
PA has been started on 09/23/2021, Awaiting approval. ?Key: [id number] ?

## 2021-09-26 ENCOUNTER — Other Ambulatory Visit: Payer: Self-pay | Admitting: Family

## 2021-09-26 DIAGNOSIS — E669 Obesity, unspecified: Secondary | ICD-10-CM

## 2021-09-26 DIAGNOSIS — R635 Abnormal weight gain: Secondary | ICD-10-CM

## 2021-09-28 MED ORDER — SAXENDA 18 MG/3ML ~~LOC~~ SOPN: PEN_INJECTOR | SUBCUTANEOUS | 1 refills | Status: DC

## 2021-09-28 NOTE — Addendum Note (Signed)
Addended by: Candie Chroman on: 09/28/2021 10:34 AM ? ? Modules accepted: Orders ? ?

## 2021-09-28 NOTE — Telephone Encounter (Signed)
PA was approved, spoke with patient and pharmacy. Pt should be contacting us if she is able to pick up medication.  ?

## 2021-09-28 NOTE — Telephone Encounter (Signed)
I called and spoke with pt in regards. Expecting a call back. ?

## 2021-09-28 NOTE — Telephone Encounter (Signed)
Patient is calling in stating that the cost of med is still too high even with PA approval and copay card.  Does not understand what has happened with her ins b/c they use to cover with PA at a copay amount of $20.00.  Can we please reach out to ins to see what has changed?    Patient states that if this will not be covered at the $20.00 copay, she would like to have a script for metformin sent in to [facility] on [address]. In [place].    Patients phone number is [phone].

## 2021-10-11 ENCOUNTER — Ambulatory Visit (HOSPITAL_COMMUNITY): Payer: 59 | Attending: Family | Admitting: Physical Therapy

## 2021-10-11 ENCOUNTER — Encounter (HOSPITAL_COMMUNITY): Payer: Self-pay | Admitting: Physical Therapy

## 2021-10-11 DIAGNOSIS — M6281 Muscle weakness (generalized): Secondary | ICD-10-CM | POA: Diagnosis present

## 2021-10-11 DIAGNOSIS — M25552 Pain in left hip: Secondary | ICD-10-CM | POA: Diagnosis present
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
## 2021-10-19 ENCOUNTER — Encounter (HOSPITAL_COMMUNITY): Payer: Self-pay

## 2021-10-19 ENCOUNTER — Ambulatory Visit (HOSPITAL_COMMUNITY): Payer: 59

## 2021-10-19 ENCOUNTER — Other Ambulatory Visit: Payer: Self-pay | Admitting: Family

## 2021-10-19 ENCOUNTER — Other Ambulatory Visit: Payer: Self-pay | Admitting: Family Medicine

## 2021-10-19 DIAGNOSIS — R635 Abnormal weight gain: Secondary | ICD-10-CM

## 2021-10-19 DIAGNOSIS — E669 Obesity, unspecified: Secondary | ICD-10-CM

## 2021-10-19 DIAGNOSIS — M25552 Pain in left hip: Secondary | ICD-10-CM

## 2021-10-19 NOTE — Therapy (Signed)
?OUTPATIENT PHYSICAL THERAPY LOWER EXTREMITY EVALUATION ? ? ?Patient Name: Dominique Davis ?MRN: [id number] ?DOB:Jul 09, 1985, 36 y.o., female ?Today's Date: 10/19/2021 ? ? PT End of Session - 10/19/21 1515   ? ? Visit Number 2   ? Number of Visits 8   ? Date for PT Re-Evaluation 11/08/21   ? Authorization Type UHC (60 VL/ 0 used)   ? PT Start Time 1515   ? PT Stop Time [id number]   ? PT Time Calculation (min) 40 min   ? Activity Tolerance Patient tolerated treatment well   ? Behavior During Therapy [facility] for tasks assessed/performed   ? ?  ?  ? ?  ? ? ?Past Medical History:  ?Diagnosis Date  ? Abdominal pain 05/10/2019  ? Anxiety   ? Bloating 05/10/2019  ? BMI 29.0-29.9,adult 08/30/2011  ? Depression   ? Dysmenorrhea   ? Loss of weight 11/21/2013  ? Migraines   ? Obesity   ? Small intestinal bacterial overgrowth AUG 2015  ? CIP/FLAG X7 DAYS(2X 2015)  ? ?Past Surgical History:  ?Procedure Laterality Date  ? BACTERIAL OVERGROWTH TEST N/A 01/03/2014  ? CANCELLED  ? BACTERIAL OVERGROWTH TEST N/A 01/10/2014  ? 0 PPM AT START, 65 PPM(75 MINS), 58 PPM(105 MINS)  ? WISDOM TOOTH EXTRACTION    ? x2  ? ?Patient Active Problem List  ? Diagnosis Date Noted  ? Obesity (BMI 30-39.9) 09/22/2021  ? Abnormal weight gain 09/22/2021  ? PCOS (polycystic ovarian syndrome) 09/02/2020  ? Mild hyperlipidemia 03/18/2020  ? Pre-diabetes 03/18/2020  ? Skin-picking disorder 10/08/2019  ? PTSD (post-traumatic stress disorder) 08/07/2019  ? Encounter for insertion of mirena IUD 03/01/2016  ? Insomnia 10/05/2015  ? MDD (major depressive disorder) 10/05/2015  ? Food allergy 03/04/2015  ? Chronic knee pain 03/31/2014  ? Environmental allergies 10/16/2013  ? Gluten intolerance 10/16/2013  ? Migraine headache 04/27/2013  ? GAD (generalized anxiety disorder) 04/27/2013  ? Chronic back pain 04/27/2013  ? ? ?PCP: Jeanie Sewer NP ? ?REFERRING PROVIDER: Aundra Dubin, PA-C  ? ?REFERRING DIAG: M25.552 (ICD-10-CM) - Pain in left hip  ? ?THERAPY DIAG:  ?Pain in left  hip ? ?ONSET DATE: several months  ? ?SUBJECTIVE:  ? ?SUBJECTIVE STATEMENT: ? ?Pt reports she has soreness of left hip 3/10. Pt reports that she thinks when she lost weight she wasn't strengthening and lost a lot of muscle strength and tone.  ? ?PERTINENT HISTORY: ?Scoliosis, Fibro,  ? ?PAIN:  ?Are you having pain? Yes: NPRS scale: 3/10 ?Pain location: Lt anterior thigh  ?Pain description: unstable, tight  ?Aggravating factors: Walking, prolonged standing, laying on LT  ?Relieving factors: some stretching and exercise  ? ?PRECAUTIONS: None ? ?WEIGHT BEARING RESTRICTIONS No ? ?FALLS:  ?Has patient fallen in last 6 months? No ? ?LIVING ENVIRONMENT: ?Lives with: lives with their partner ?Lives in: House/apartment ? ?OCCUPATION: Med tech  ? ?PLOF: Independent ? ?PATIENT GOALS improve pain free mobility ? ? ?OBJECTIVE:  ? ?DIAGNOSTIC FINDINGS: xrays unremarkable  ? ?PATIENT SURVEYS:  ?FOTO 59% function  ? ?COGNITION: ? Overall cognitive status: Within functional limits for tasks assessed   ?  ? ?PALPATION: ?Min TTP about LT anterior hip joint (psoas/ rectus femoris) ? ?LE ROM: ?PROM WFL, pain free  ? ? ?LE MMT: ? ?MMT Right ?10/19/2021 Left ?10/19/2021  ?Hip flexion 4+ 4  ?Hip extension 4+ 4  ?Hip abduction 4+ 4  ?Hip adduction    ?Hip internal rotation    ?Hip external rotation    ?  Knee flexion    ?Knee extension 5 5  ?Ankle dorsiflexion 5 5  ?Ankle plantarflexion    ?Ankle inversion    ?Ankle eversion    ? (Blank rows = not tested) ? ? ?GAIT: ?Unremarkable ? ? ?TODAY'S TREATMENT: ? ? 10/19/21 ? Standing hip flexion, ext, abd ? Worlds greatest stretch with chest openers and ending with hamstring stretch ? Long sit, slr up and over yoga block x30 ? Fire hydrants 2x15 b/l ?Quadruped hip extension 3x10 B/L ? Wind shield wipers x10 each side-> into hip raises into kneeling x4 b/l  ?  HIP CARS on left with uni UE support x8  ?  ?10/11/21 ?SLR series (SLR/ abduction/ extension) ? ? ?PATIENT EDUCATION:  ?Education details: updated  HEP ?Person educated: Patient ?Education method: Explanation and Demonstration ?Education comprehension: verbalized understanding and returned demonstration ? ? ?HOME EXERCISE PROGRAM: ?10/11/21 ?SLR series (SLR/ abduction/ extension) ? ?10/19/21 ?Access Code: [email or web address] ?URL: [email or web address] ?Date: 10/19/2021 ?Prepared by: Leota Jacobsen ? ?Exercises ?- Hip Abduction with Resistance Loop  - 1 x daily - 7 x weekly - 3 sets - 10 reps ?- Hip Extension with Resistance Loop  - 1 x daily - 7 x weekly - 3 sets - 10 reps ?- Standing Hip Flexion with Resistance Loop  - 1 x daily - 7 x weekly - 3 sets - 10 reps ? ?ASSESSMENT:  ? ?CLINICAL IMPRESSION: ?Patient reports continued 2/10 left hip pain throughout session without increase of pain through mobility and strengthening exercises. Patient with more restriction in left hip IR/ER as compared to right. Pt will continue to benefit from hip mobility and strengthening oh hip flexors and extensors to return to prior unrestricted lifestyle free of pain.  ? ? ? ?OBJECTIVE IMPAIRMENTS decreased activity tolerance, decreased strength, increased fascial restrictions, and pain.  ? ?ACTIVITY LIMITATIONS cleaning, community activity, occupation, Medical sales representative, yard work, shopping, and yard work.  ? ?PERSONAL FACTORS  none  are also affecting patient's functional outcome.  ? ? ?REHAB POTENTIAL: Good ? ?CLINICAL DECISION MAKING: Stable/uncomplicated ? ?EVALUATION COMPLEXITY: Low ? ? ?GOALS: ?SHORT TERM GOALS: Target date: 11/02/2021 ? ?Patient will be independent with initial HEP and self-management strategies to improve functional outcomes ?Baseline:  ?Goal status: INITIAL  ? ?LONG TERM GOALS: Target date: 11/16/2021 ? ?Patient will be independent with advanced HEP and self-management strategies to improve functional outcomes ?Baseline:  ?Goal status: INITIAL ? ?2.  Patient will improve FOTO score to predicted value to indicate improvement in functional outcomes ?Baseline:  59% ?Goal status: INITIAL ? ?3.  Patient will report reduction of LT hip pain to less than 2/10 during WB for improved quality of life and ability to perform ADLs  ?Baseline: 8/10  ?Goal status: INITIAL ? ?4. Patient will have equal to or > 4+/5 MMT throughout LLE to improve ability to perform functional mobility, stair ambulation and ADLs.  ?Baseline: See MMT ?Goal status: INITIAL ? ? ? ?PLAN: ?PT FREQUENCY: 1-2x/week ? ?PT DURATION: 4 weeks ? ?PLANNED INTERVENTIONS: Therapeutic exercises, Therapeutic activity, Neuromuscular re-education, Balance training, Gait training, Patient/Family education, Joint manipulation, Joint mobilization, Stair training, Aquatic Therapy, Dry Needling, Electrical stimulation, Spinal manipulation, Spinal mobilization, Cryotherapy, Moist heat, scar mobilization, Taping, Traction, Ultrasound, Biofeedback, Ionotophoresis 4mg /ml Dexamethasone, and Manual therapy. ? ? ?PLAN FOR NEXT SESSION: Progress hip (glute) strengthening as tolerated with focus on WB and stabilization. Mobility and strengthen left hip flexor as tolerated. ? ?3:58 PM, 10/19/21 ?Fredna Stricker PT, DPT  ? ?

## 2021-10-20 ENCOUNTER — Ambulatory Visit (HOSPITAL_COMMUNITY): Payer: 59

## 2021-10-20 ENCOUNTER — Other Ambulatory Visit: Payer: Self-pay

## 2021-10-20 ENCOUNTER — Encounter (HOSPITAL_COMMUNITY): Payer: Self-pay

## 2021-10-20 ENCOUNTER — Encounter: Payer: Self-pay | Admitting: Family

## 2021-10-20 DIAGNOSIS — M6281 Muscle weakness (generalized): Secondary | ICD-10-CM

## 2021-10-20 DIAGNOSIS — M25552 Pain in left hip: Secondary | ICD-10-CM

## 2021-10-20 MED ORDER — INSULIN PEN NEEDLE 32G X 4 MM MISC: 1 refills | Status: DC

## 2021-10-20 NOTE — Therapy (Signed)
?OUTPATIENT PHYSICAL THERAPY LOWER EXTREMITY EVALUATION ? ? ?Patient Name: Dominique Davis ?MRN: [id number] ?DOB:29-Jan-1986, 36 y.o., female ?Today's Date: 10/20/2021 ? ? PT End of Session - 10/20/21 1433   ? ? Visit Number 3   ? Number of Visits 8   ? Date for PT Re-Evaluation 11/08/21   ? Authorization Type UHC (60 VL/ 0 used)   ? PT Start Time 1435   ? PT Stop Time 1515   ? PT Time Calculation (min) 40 min   ? Activity Tolerance Patient tolerated treatment well   ? Behavior During Therapy [facility] for tasks assessed/performed   ? ?  ?  ? ?  ? ? ?Past Medical History:  ?Diagnosis Date  ? Abdominal pain 05/10/2019  ? Anxiety   ? Bloating 05/10/2019  ? BMI 29.0-29.9,adult 08/30/2011  ? Depression   ? Dysmenorrhea   ? Loss of weight 11/21/2013  ? Migraines   ? Obesity   ? Small intestinal bacterial overgrowth AUG 2015  ? CIP/FLAG X7 DAYS(2X 2015)  ? ?Past Surgical History:  ?Procedure Laterality Date  ? BACTERIAL OVERGROWTH TEST N/A 01/03/2014  ? CANCELLED  ? BACTERIAL OVERGROWTH TEST N/A 01/10/2014  ? 0 PPM AT START, 65 PPM(75 MINS), 58 PPM(105 MINS)  ? WISDOM TOOTH EXTRACTION    ? x2  ? ?Patient Active Problem List  ? Diagnosis Date Noted  ? Obesity (BMI 30-39.9) 09/22/2021  ? Abnormal weight gain 09/22/2021  ? PCOS (polycystic ovarian syndrome) 09/02/2020  ? Mild hyperlipidemia 03/18/2020  ? Pre-diabetes 03/18/2020  ? Skin-picking disorder 10/08/2019  ? PTSD (post-traumatic stress disorder) 08/07/2019  ? Encounter for insertion of mirena IUD 03/01/2016  ? Insomnia 10/05/2015  ? MDD (major depressive disorder) 10/05/2015  ? Food allergy 03/04/2015  ? Chronic knee pain 03/31/2014  ? Environmental allergies 10/16/2013  ? Gluten intolerance 10/16/2013  ? Migraine headache 04/27/2013  ? GAD (generalized anxiety disorder) 04/27/2013  ? Chronic back pain 04/27/2013  ? ? ?PCP: Dulce Sellar NP ? ?REFERRING PROVIDER: Cristie Hem, PA-C  ? ?REFERRING DIAG: M25.552 (ICD-10-CM) - Pain in left hip  ? ?THERAPY DIAG:  ?Pain in left  hip ? ?Muscle weakness (generalized) ? ?ONSET DATE: several months  ? ?SUBJECTIVE:  ? ?SUBJECTIVE STATEMENT: ? ?Pt reports she is feeling some muscle soreness b/l hamstring, glutes and quads. Soreness kept pt from sleeping last night. Pt reports she did not take any OTC or heat/ice to try and alleviate pain.  ? ?PERTINENT HISTORY: ?Scoliosis, Fibro,  ? ?PAIN:  ?Are you having pain? Yes: NPRS scale: 4/10 ?Pain location: Lt anterior thigh  ?Pain description: unstable, tight  ?Aggravating factors: Walking, prolonged standing, laying on LT  ?Relieving factors: some stretching and exercise  ? ?PRECAUTIONS: None ? ?WEIGHT BEARING RESTRICTIONS No ? ?FALLS:  ?Has patient fallen in last 6 months? No ? ?LIVING ENVIRONMENT: ?Lives with: lives with their partner ?Lives in: House/apartment ? ?OCCUPATION: Med tech  ? ?PLOF: Independent ? ?PATIENT GOALS improve pain free mobility ? ? ?OBJECTIVE:  ? ?DIAGNOSTIC FINDINGS: xrays unremarkable  ? ?PATIENT SURVEYS:  ?FOTO 59% function  ? ?COGNITION: ? Overall cognitive status: Within functional limits for tasks assessed   ?  ? ?PALPATION: ?Min TTP about LT anterior hip joint (psoas/ rectus femoris) ? ?LE ROM: ?PROM WFL, pain free  ? ? ?LE MMT: ? ?MMT Right ?10/20/2021 Left ?10/20/2021  ?Hip flexion 4+ 4  ?Hip extension 4+ 4  ?Hip abduction 4+ 4  ?Hip adduction    ?Hip internal  rotation    ?Hip external rotation    ?Knee flexion    ?Knee extension 5 5  ?Ankle dorsiflexion 5 5  ?Ankle plantarflexion    ?Ankle inversion    ?Ankle eversion    ? (Blank rows = not tested) ? ? ?GAIT: ?Unremarkable ? ? ?TODAY'S TREATMENT: ? 10/20/21 ? Hamstring seated stretch 15 sec x3 b/l ?  Supine quad stretch/ hip flexor stretch with belt 15 secx3 ? Standing glute stretch/figure 4 with mat 15 sec x3 ? Stair stretch with b/l UE support ?  ? DKTC with adductor squeeze 2x10 with 2 sec hold ? Bridge single leg x20 b/l  ?Clamshell red tband x20 b/l  ?Retro 4 plates and lateral walkouts x3 plates x 5 laps  each ? ?  ? ? 10/19/21 ? Standing hip flexion, ext, abd ? Worlds greatest stretch with chest openers and ending with hamstring stretch ? Long sit, slr up and over yoga block x30 ? Fire hydrants 2x15 b/l ?Quadruped hip extension 3x10 B/L ? Wind shield wipers x10 each side-> into hip raises into kneeling x4 b/l  ?  HIP CARS on left with uni UE support x8  ?  ?10/11/21 ?SLR series (SLR/ abduction/ extension) ? ? ?PATIENT EDUCATION:  ?Education details: updated HEP ?Person educated: Patient ?Education method: Explanation and Demonstration ?Education comprehension: verbalized understanding and returned demonstration ? ? ?HOME EXERCISE PROGRAM: ?10/11/21 ?SLR series (SLR/ abduction/ extension) ? ?10/19/21 ?Access Code: [email or web address] ?URL: [email or web address] ?Date: 10/19/2021 ?Prepared by: Aleatha Borer ? ?Exercises ?- Hip Abduction with Resistance Loop  - 1 x daily - 7 x weekly - 3 sets - 10 reps ?- Hip Extension with Resistance Loop  - 1 x daily - 7 x weekly - 3 sets - 10 reps ?- Standing Hip Flexion with Resistance Loop  - 1 x daily - 7 x weekly - 3 sets - 10 reps ? ? ?10/20/21 ?Access Code: [email or web address] ?URL: [email or web address] ?Date: 10/20/2021 ?Prepared by: Aleatha Borer ? ?Exercises ?- Seated Table Hamstring Stretch  - 2 x daily - 7 x weekly - 1 sets - 3 reps - 15 hold ?- Supine Quadriceps Stretch with Strap on Table  - 2 x daily - 7 x weekly - 1 sets - 3 reps - 15 hold ?- Seated Piriformis Stretch  - 2 x daily - 7 x weekly - 1 sets - 3 reps - 15 hold ?- Standing Gastroc Stretch  - 2 x daily - 7 x weekly - 1 sets - 3 reps - 15 hold ? ?ASSESSMENT:  ? ?CLINICAL IMPRESSION: ?Patient reports pain from 4/10 at beginning of session to 3/10 post session. Pt tolerated treatment well. Pt reports stretching improving feeling of pain in front of hip. Continue with glute strengthening. Continue with poc to improve strength, mobility for return to pain free lifestyle.  ? ? ? ?OBJECTIVE IMPAIRMENTS decreased  activity tolerance, decreased strength, increased fascial restrictions, and pain.  ? ?ACTIVITY LIMITATIONS cleaning, community activity, occupation, Pharmacologist, yard work, shopping, and yard work.  ? ?PERSONAL FACTORS  none  are also affecting patient's functional outcome.  ? ? ?REHAB POTENTIAL: Good ? ?CLINICAL DECISION MAKING: Stable/uncomplicated ? ?EVALUATION COMPLEXITY: Low ? ? ?GOALS: ?SHORT TERM GOALS: Target date: 11/03/2021 ? ?Patient will be independent with initial HEP and self-management strategies to improve functional outcomes ?Baseline:  ?Goal status: INITIAL  ? ?LONG TERM GOALS: Target date: 11/17/2021 ? ?Patient will be independent with advanced HEP and self-management strategies to improve functional outcomes ?Baseline:  ?Goal status:  INITIAL ? ?2.  Patient will improve FOTO score to predicted value to indicate improvement in functional outcomes ?Baseline: 59% ?Goal status: INITIAL ? ?3.  Patient will report reduction of LT hip pain to less than 2/10 during WB for improved quality of life and ability to perform ADLs  ?Baseline: 8/10  ?Goal status: INITIAL ? ?4. Patient will have equal to or > 4+/5 MMT throughout LLE to improve ability to perform functional mobility, stair ambulation and ADLs.  ?Baseline: See MMT ?Goal status: INITIAL ? ? ? ?PLAN: ?PT FREQUENCY: 1-2x/week ? ?PT DURATION: 4 weeks ? ?PLANNED INTERVENTIONS: Therapeutic exercises, Therapeutic activity, Neuromuscular re-education, Balance training, Gait training, Patient/Family education, Joint manipulation, Joint mobilization, Stair training, Aquatic Therapy, Dry Needling, Electrical stimulation, Spinal manipulation, Spinal mobilization, Cryotherapy, Moist heat, scar mobilization, Taping, Traction, Ultrasound, Biofeedback, Ionotophoresis 4mg /ml Dexamethasone, and Manual therapy. ? ? ?PLAN FOR NEXT SESSION: Progress hip (glute) strengthening as tolerated with focus on WB and stabilization. Mobility and strengthen left hip flexor as  tolerated. ? ?2:34 PM, 10/20/21 ?Avyn Aden PT, DPT  ? ?

## 2021-10-28 ENCOUNTER — Ambulatory Visit (HOSPITAL_COMMUNITY): Payer: 59

## 2021-11-03 ENCOUNTER — Ambulatory Visit (HOSPITAL_COMMUNITY): Payer: 59 | Admitting: Physical Therapy

## 2021-11-03 DIAGNOSIS — M6281 Muscle weakness (generalized): Secondary | ICD-10-CM

## 2021-11-03 DIAGNOSIS — M25552 Pain in left hip: Secondary | ICD-10-CM

## 2021-11-03 NOTE — Therapy (Signed)
OUTPATIENT PHYSICAL THERAPY LOWER EXTREMITY  TREATMENT   Patient Name: EMALYN SCHOU MRN: [id number] DOB:17-Jul-1985, 36 y.o., female Today's Date: 11/03/2021   PT End of Session - 11/03/21 1533     Visit Number 4    Number of Visits 8    Date for PT Re-Evaluation 11/08/21    Authorization Type UHC (60 VL/ 0 used)    PT Start Time 1533    PT Stop Time 1612    PT Time Calculation (min) 39 min    Activity Tolerance Patient tolerated treatment well    Behavior During Therapy [facility] for tasks assessed/performed             Past Medical History:  Diagnosis Date   Abdominal pain 05/10/2019   Anxiety    Bloating 05/10/2019   BMI 29.0-29.9,adult 08/30/2011   Depression    Dysmenorrhea    Loss of weight 11/21/2013   Migraines    Obesity    Small intestinal bacterial overgrowth AUG 2015   CIP/FLAG X7 DAYS(2X 2015)   Past Surgical History:  Procedure Laterality Date   BACTERIAL OVERGROWTH TEST N/A 01/03/2014   CANCELLED   BACTERIAL OVERGROWTH TEST N/A 01/10/2014   0 PPM AT START, 65 PPM(75 MINS), 58 PPM(105 MINS)   WISDOM TOOTH EXTRACTION     x2   Patient Active Problem List   Diagnosis Date Noted   Obesity (BMI 30-39.9) 09/22/2021   Abnormal weight gain 09/22/2021   PCOS (polycystic ovarian syndrome) 09/02/2020   Mild hyperlipidemia 03/18/2020   Pre-diabetes 03/18/2020   Skin-picking disorder 10/08/2019   PTSD (post-traumatic stress disorder) 08/07/2019   Encounter for insertion of mirena IUD 03/01/2016   Insomnia 10/05/2015   MDD (major depressive disorder) 10/05/2015   Food allergy 03/04/2015   Chronic knee pain 03/31/2014   Environmental allergies 10/16/2013   Gluten intolerance 10/16/2013   Migraine headache 04/27/2013   GAD (generalized anxiety disorder) 04/27/2013   Chronic back pain 04/27/2013    PCP: Dulce Sellar NP  REFERRING PROVIDER: Cristie Hem, PA-C   REFERRING DIAG: [phone] (ICD-10-CM) - Pain in left hip   THERAPY DIAG:  Pain in left  hip  ONSET DATE: several months   SUBJECTIVE:   SUBJECTIVE STATEMENT:  Pt reports she is overall better with the new exercises.  Had to cancel last week so has been 2 weeks since last visit.  Reports sleeping is still not great with difficulty laying on her Lt side.   PERTINENT HISTORY: Scoliosis, Fibro,   PAIN:  Are you having pain? Yes: NPRS scale: 4/10 Pain location: Lt anterior thigh  Pain description: unstable, tight  Aggravating factors: Walking, prolonged standing, laying on LT  Relieving factors: some stretching and exercise   PRECAUTIONS: None  WEIGHT BEARING RESTRICTIONS No  FALLS:  Has patient fallen in last 6 months? No  LIVING ENVIRONMENT: Lives with: lives with their partner Lives in: House/apartment  OCCUPATION: Med tech   PLOF: Independent  PATIENT GOALS improve pain free mobility   OBJECTIVE:   DIAGNOSTIC FINDINGS: xrays unremarkable   PATIENT SURVEYS:  FOTO 59% function   COGNITION:  Overall cognitive status: Within functional limits for tasks assessed      PALPATION: Min TTP about LT anterior hip joint (psoas/ rectus femoris)  LE ROM: PROM WFL, pain free    LE MMT:  MMT Right 10/20/2021 Left 10/20/2021  Hip flexion 4+ 4  Hip extension 4+ 4  Hip abduction 4+ 4  Hip adduction    Hip internal  rotation    Hip external rotation    Knee flexion    Knee extension 5 5  Ankle dorsiflexion 5 5  Ankle plantarflexion    Ankle inversion    Ankle eversion     (Blank rows = not tested)   GAIT: Unremarkable   TODAY'S TREATMENT:  11/03/21  Standing hip flexion 10X2 sets each LE with HHA   Hip extension RTB 10X2   Hip abduction RTB 10X2   Forward lunge no UE 2X10 onto 4" step   Cable walk outs each direction 5RT 4PL    Squats 2X10   Vectors 10X5" each with 1 HHA   Slant board stretch 3X20"   10/20/21  Hamstring seated stretch 15 sec x3 b/l   Supine quad stretch/ hip flexor stretch with belt 15 secx3  Standing glute  stretch/figure 4 with mat 15 sec x3  Stair stretch with b/l UE support    DKTC with adductor squeeze 2x10 with 2 sec hold  Bridge single leg x20 b/l  Clamshell red tband x20 b/l  Retro 4 plates and lateral walkouts x3 plates x 5 laps each      10/19/21  Standing hip flexion, ext, abd  Worlds greatest stretch with chest openers and ending with hamstring stretch  Long sit, slr up and over yoga block x30  Fire hydrants 2x15 b/l Quadruped hip extension 3x10 B/L  Wind shield wipers x10 each side-> into hip raises into kneeling x4 b/l    HIP CARS on left with uni UE support x8    10/11/21 SLR series (SLR/ abduction/ extension)   PATIENT EDUCATION:  Education details: updated HEP Person educated: Patient Education method: Medical illustrator Education comprehension: verbalized understanding and returned demonstration   HOME EXERCISE PROGRAM: 10/11/21 SLR series (SLR/ abduction/ extension)  10/19/21 Access Code: [email or web address] URL: [email or web address] Date: 10/19/2021 Prepared by: Aleatha Borer  Exercises - Hip Abduction with Resistance Loop  - 1 x daily - 7 x weekly - 3 sets - 10 reps - Hip Extension with Resistance Loop  - 1 x daily - 7 x weekly - 3 sets - 10 reps - Standing Hip Flexion with Resistance Loop  - 1 x daily - 7 x weekly - 3 sets - 10 reps   10/20/21 Access Code: [email or web address] URL: [email or web address] Date: 10/20/2021 Prepared by: Aleatha Borer  Exercises - Seated Table Hamstring Stretch  - 2 x daily - 7 x weekly - 1 sets - 3 reps - 15 hold - Supine Quadriceps Stretch with Strap on Table  - 2 x daily - 7 x weekly - 1 sets - 3 reps - 15 hold - Seated Piriformis Stretch  - 2 x daily - 7 x weekly - 1 sets - 3 reps - 15 hold - Standing Gastroc Stretch  - 2 x daily - 7 x weekly - 1 sets - 3 reps - 15 hold  ASSESSMENT:   CLINICAL IMPRESSION: Continued with focus on improving LE strength.  Pt with decreasing pain and increasing  function.  Reports good challenge with thera band resistance for hips and admits to not doing these at home like she should.  Completed all strengthening exercises in standing today.  Progressed to vectors and squats for glute strengthening.  Concluded session with stretching activities. Pt did not have pain or difficulty with any exercise completed today. Pt will continue to benefit from continued strengthening and stretching activities for LE to improve condition.     OBJECTIVE IMPAIRMENTS decreased activity tolerance, decreased  strength, increased fascial restrictions, and pain.   ACTIVITY LIMITATIONS cleaning, community activity, occupation, Pharmacologistlaundry, yard work, shopping, and yard work.   PERSONAL FACTORS  none  are also affecting patient's functional outcome.    REHAB POTENTIAL: Good  CLINICAL DECISION MAKING: Stable/uncomplicated  EVALUATION COMPLEXITY: Low   GOALS: SHORT TERM GOALS: Target date: 11/17/2021  Patient will be independent with initial HEP and self-management strategies to improve functional outcomes Baseline:  Goal status: IN PROGRESS   LONG TERM GOALS: Target date: 12/01/2021  Patient will be independent with advanced HEP and self-management strategies to improve functional outcomes Baseline:  Goal status: IN PROGRESS  2.  Patient will improve FOTO score to predicted value to indicate improvement in functional outcomes Baseline: 59% Goal status: IN PROGRESS  3.  Patient will report reduction of LT hip pain to less than 2/10 during WB for improved quality of life and ability to perform ADLs  Baseline: 8/10  Goal status: IN PROGRESS  4. Patient will have equal to or > 4+/5 MMT throughout LLE to improve ability to perform functional mobility, stair ambulation and ADLs.  Baseline: See MMT Goal status: IN PROGRESS    PLAN: PT FREQUENCY: 1-2x/week  PT DURATION: 4 weeks  PLANNED INTERVENTIONS: Therapeutic exercises, Therapeutic activity, Neuromuscular  re-education, Balance training, Gait training, Patient/Family education, Joint manipulation, Joint mobilization, Stair training, Aquatic Therapy, Dry Needling, Electrical stimulation, Spinal manipulation, Spinal mobilization, Cryotherapy, Moist heat, scar mobilization, Taping, Traction, Ultrasound, Biofeedback, Ionotophoresis 4mg /ml Dexamethasone, and Manual therapy.   PLAN FOR NEXT SESSION: Progress hip (glute) strengthening as tolerated with focus on WB and stabilization. Mobility and strengthen left hip flexor as tolerated.  3:34 PM, 11/03/21 Lurena NidaAmy B Antonios Ostrow, PTA/CLT [facility] [place] Ph: [phone]

## 2021-11-04 ENCOUNTER — Ambulatory Visit (HOSPITAL_COMMUNITY): Payer: 59 | Attending: Family

## 2021-11-04 ENCOUNTER — Encounter (HOSPITAL_COMMUNITY): Payer: Self-pay

## 2021-11-04 DIAGNOSIS — M6281 Muscle weakness (generalized): Secondary | ICD-10-CM | POA: Diagnosis present

## 2021-11-04 DIAGNOSIS — M25552 Pain in left hip: Secondary | ICD-10-CM | POA: Insufficient documentation

## 2021-11-04 NOTE — Therapy (Signed)
OUTPATIENT PHYSICAL THERAPY LOWER EXTREMITY  TREATMENT   Patient Name: ZAELA GRALEY MRN: [id number] DOB:1985-12-16, 36 y.o., female Today's Date: 11/04/2021   PT End of Session - 11/04/21 1601     Visit Number 5    Number of Visits 8    Date for PT Re-Evaluation 11/08/21    Authorization Type [employer] (60 VL/ 0 used)    PT Start Time 1527    PT Stop Time 1610    PT Time Calculation (min) 43 min    Activity Tolerance Patient tolerated treatment well    Behavior During Therapy WFL for tasks assessed/performed              Past Medical History:  Diagnosis Date   Abdominal pain 05/10/2019   Anxiety    Bloating 05/10/2019   BMI 29.0-29.9,adult 08/30/2011   Depression    Dysmenorrhea    Loss of weight 11/21/2013   Migraines    Obesity    Small intestinal bacterial overgrowth AUG 2015   CIP/FLAG X7 DAYS(2X 2015)   Past Surgical History:  Procedure Laterality Date   BACTERIAL OVERGROWTH TEST N/A 01/03/2014   CANCELLED   BACTERIAL OVERGROWTH TEST N/A 01/10/2014   0 PPM AT START, 65 PPM(75 MINS), 58 PPM(105 MINS)   WISDOM TOOTH EXTRACTION     x2   Patient Active Problem List   Diagnosis Date Noted   Obesity (BMI 30-39.9) 09/22/2021   Abnormal weight gain 09/22/2021   PCOS (polycystic ovarian syndrome) 09/02/2020   Mild hyperlipidemia 03/18/2020   Pre-diabetes 03/18/2020   Skin-picking disorder 10/08/2019   PTSD (post-traumatic stress disorder) 08/07/2019   Encounter for insertion of mirena IUD 03/01/2016   Insomnia 10/05/2015   MDD (major depressive disorder) 10/05/2015   Food allergy 03/04/2015   Chronic knee pain 03/31/2014   Environmental allergies 10/16/2013   Gluten intolerance 10/16/2013   Migraine headache 04/27/2013   GAD (generalized anxiety disorder) 04/27/2013   Chronic back pain 04/27/2013    PCP: Dulce Sellar NP  REFERRING PROVIDER: Cristie Hem, PA-C   REFERRING DIAG: [phone] (ICD-10-CM) - Pain in left hip   THERAPY DIAG:  Pain in left  hip  Muscle weakness (generalized)  ONSET DATE: several months   SUBJECTIVE:   SUBJECTIVE STATEMENT:  Pt stated she is feeling good today.  Reports sometimes she feels pain during the movement in groin area, no longer has pain the day following.    PERTINENT HISTORY: Scoliosis, Fibro,   PAIN:  Are you having pain? Yes: NPRS scale: 4/10 Pain location: Lt anterior thigh  Pain description: unstable, tight  Aggravating factors: Walking, prolonged standing, laying on LT  Relieving factors: some stretching and exercise   PRECAUTIONS: None  WEIGHT BEARING RESTRICTIONS No  FALLS:  Has patient fallen in last 6 months? No  LIVING ENVIRONMENT: Lives with: lives with their partner Lives in: House/apartment  OCCUPATION: Med tech   PLOF: Independent  PATIENT GOALS improve pain free mobility   OBJECTIVE:   DIAGNOSTIC FINDINGS: xrays unremarkable   PATIENT SURVEYS:  FOTO 59% function   COGNITION:  Overall cognitive status: Within functional limits for tasks assessed      PALPATION: Min TTP about LT anterior hip joint (psoas/ rectus femoris)  LE ROM: PROM WFL, pain free    LE MMT:  MMT Right 10/20/2021 Left 10/20/2021  Hip flexion 4+ 4  Hip extension 4+ 4  Hip abduction 4+ 4  Hip adduction    Hip internal rotation    Hip external  rotation    Knee flexion    Knee extension 5 5  Ankle dorsiflexion 5 5  Ankle plantarflexion    Ankle inversion    Ankle eversion     (Blank rows = not tested)   GAIT: Unremarkable   TODAY'S TREATMENT:  11/04/21  Squat then heel raise 10x 5" no HHA  Forward lunge with UE flexion 2X10 onto 4" step  Vector stance 5x 5"on foam no HHA  Hip opener then plie  Hip flexor fast movement then hold  Cable walk outs each direction 5RT 4PL (forward, retro, sidestep each direction)  6in forward step up 10x no HHA  6in lateral step up 10x  6in step down 10x  Hip flexor stretch 3x 30" on 12in step  Hip flexion with RTB BLE 10x 5"  holds  11/03/21  Standing hip flexion 10X2 sets each LE with HHA   Hip extension RTB 10X2   Hip abduction RTB 10X2   Forward lunge no UE 2X10 onto 4" step   Cable walk outs each direction 5RT 4PL    Squats 2X10   Vectors 10X5" each with 1 HHA   Slant board stretch 3X20"   10/20/21  Hamstring seated stretch 15 sec x3 b/l   Supine quad stretch/ hip flexor stretch with belt 15 secx3  Standing glute stretch/figure 4 with mat 15 sec x3  Stair stretch with b/l UE support    DKTC with adductor squeeze 2x10 with 2 sec hold  Bridge single leg x20 b/l  Clamshell red tband x20 b/l  Retro 4 plates and lateral walkouts x3 plates x 5 laps each      10/19/21  Standing hip flexion, ext, abd  Worlds greatest stretch with chest openers and ending with hamstring stretch  Long sit, slr up and over yoga block x30  Fire hydrants 2x15 b/l Quadruped hip extension 3x10 B/L  Wind shield wipers x10 each side-> into hip raises into kneeling x4 b/l    HIP CARS on left with uni UE support x8    10/11/21 SLR series (SLR/ abduction/ extension)   PATIENT EDUCATION:  Education details: updated HEP Person educated: Patient Education method: Medical illustratorxplanation and Demonstration Education comprehension: verbalized understanding and returned demonstration   HOME EXERCISE PROGRAM: 10/11/21 SLR series (SLR/ abduction/ extension)  10/19/21 Access Code: [email or web address] URL: [email or web address] Date: 10/19/2021 Prepared by: Aleatha BorerJulianne Nugent  Exercises - Hip Abduction with Resistance Loop  - 1 x daily - 7 x weekly - 3 sets - 10 reps - Hip Extension with Resistance Loop  - 1 x daily - 7 x weekly - 3 sets - 10 reps - Standing Hip Flexion with Resistance Loop  - 1 x daily - 7 x weekly - 3 sets - 10 reps   10/20/21 Access Code: [email or web address] URL: [email or web address] Date: 10/20/2021 Prepared by: Aleatha BorerJulianne Nugent  Exercises - Seated Table Hamstring Stretch  - 2 x daily - 7 x weekly - 1 sets - 3  reps - 15 hold - Supine Quadriceps Stretch with Strap on Table  - 2 x daily - 7 x weekly - 1 sets - 3 reps - 15 hold - Seated Piriformis Stretch  - 2 x daily - 7 x weekly - 1 sets - 3 reps - 15 hold - Standing Gastroc Stretch  - 2 x daily - 7 x weekly - 1 sets - 3 reps - 15 hold  ASSESSMENT:   CLINICAL IMPRESSION: Pt tolerated well to session with increased challenging exercises.  Added foam  with vector stance with good stability noted.  Added mobility exercises with reports that felt good.  Added step up training following reports of pain ascending stairs, pt able to complete all exercises with no report of pain and good mechanics following initial demonstration.  Added hip flexion with theraband resistance to HEP, verbalized understanding.   OBJECTIVE IMPAIRMENTS decreased activity tolerance, decreased strength, increased fascial restrictions, and pain.   ACTIVITY LIMITATIONS cleaning, community activity, occupation, Pharmacologist, yard work, shopping, and yard work.   PERSONAL FACTORS  none  are also affecting patient's functional outcome.    REHAB POTENTIAL: Good  CLINICAL DECISION MAKING: Stable/uncomplicated  EVALUATION COMPLEXITY: Low   GOALS: SHORT TERM GOALS: Target date: 11/18/2021  Patient will be independent with initial HEP and self-management strategies to improve functional outcomes Baseline:  Goal status: IN PROGRESS   LONG TERM GOALS: Target date: 12/02/2021  Patient will be independent with advanced HEP and self-management strategies to improve functional outcomes Baseline:  Goal status: IN PROGRESS  2.  Patient will improve FOTO score to predicted value to indicate improvement in functional outcomes Baseline: 59% Goal status: IN PROGRESS  3.  Patient will report reduction of LT hip pain to less than 2/10 during WB for improved quality of life and ability to perform ADLs  Baseline: 8/10  Goal status: IN PROGRESS  4. Patient will have equal to or > 4+/5 MMT  throughout LLE to improve ability to perform functional mobility, stair ambulation and ADLs.  Baseline: See MMT Goal status: IN PROGRESS    PLAN: PT FREQUENCY: 1-2x/week  PT DURATION: 4 weeks  PLANNED INTERVENTIONS: Therapeutic exercises, Therapeutic activity, Neuromuscular re-education, Balance training, Gait training, Patient/Family education, Joint manipulation, Joint mobilization, Stair training, Aquatic Therapy, Dry Needling, Electrical stimulation, Spinal manipulation, Spinal mobilization, Cryotherapy, Moist heat, scar mobilization, Taping, Traction, Ultrasound, Biofeedback, Ionotophoresis 4mg /ml Dexamethasone, and Manual therapy.   PLAN FOR NEXT SESSION: Progress note next session.  Progress hip (glute) strengthening as tolerated with focus on WB and stabilization. Mobility and strengthen left hip flexor as tolerated.  , LPTA/CLT; CBIS [phone] 4:10 PM, 11/04/21

## 2021-11-09 ENCOUNTER — Encounter (HOSPITAL_COMMUNITY): Payer: Self-pay | Admitting: Physical Therapy

## 2021-11-09 ENCOUNTER — Ambulatory Visit (HOSPITAL_COMMUNITY): Payer: 59 | Attending: Family | Admitting: Physical Therapy

## 2021-11-09 DIAGNOSIS — M25552 Pain in left hip: Secondary | ICD-10-CM | POA: Diagnosis present

## 2021-11-09 DIAGNOSIS — M6281 Muscle weakness (generalized): Secondary | ICD-10-CM | POA: Diagnosis not present

## 2021-11-09 NOTE — Therapy (Signed)
OUTPATIENT PHYSICAL THERAPY LOWER EXTREMITY  TREATMENT   Patient Name: Dominique Davis MRN: [id number] DOB:17-Jan-1986, 36 y.o., female Today's Date: 11/09/2021 PHYSICAL THERAPY DISCHARGE SUMMARY  Visits from Start of Care: 6  Current functional level related to goals / functional outcomes: See below   Remaining deficits: See below   Education / Equipment: See assessment   Patient agrees to discharge. Patient goals were met. Patient is being discharged due to meeting the stated rehab goals.   PT End of Session - 11/09/21 1351     Visit Number 6    Number of Visits 8    Date for PT Re-Evaluation 11/09/21    Authorization Type [employer] (60 VL/ 0 used)    PT Start Time 1350    PT Stop Time 1425    PT Time Calculation (min) 35 min    Activity Tolerance Patient tolerated treatment well    Behavior During Therapy WFL for tasks assessed/performed              Past Medical History:  Diagnosis Date   Abdominal pain 05/10/2019   Anxiety    Bloating 05/10/2019   BMI 29.0-29.9,adult 08/30/2011   Depression    Dysmenorrhea    Loss of weight 11/21/2013   Migraines    Obesity    Small intestinal bacterial overgrowth AUG 2015   CIP/FLAG X7 DAYS(2X 2015)   Past Surgical History:  Procedure Laterality Date   BACTERIAL OVERGROWTH TEST N/A 01/03/2014   CANCELLED   BACTERIAL OVERGROWTH TEST N/A 01/10/2014   0 PPM AT START, 65 PPM(75 MINS), 57 PPM(105 MINS)   WISDOM TOOTH EXTRACTION     x2   Patient Active Problem List   Diagnosis Date Noted   Obesity (BMI 30-39.9) 09/22/2021   Abnormal weight gain 09/22/2021   PCOS (polycystic ovarian syndrome) 09/02/2020   Mild hyperlipidemia 03/18/2020   Pre-diabetes 03/18/2020   Skin-picking disorder 10/08/2019   PTSD (post-traumatic stress disorder) 08/07/2019   Encounter for insertion of mirena IUD 03/01/2016   Insomnia 10/05/2015   MDD (major depressive disorder) 10/05/2015   Food allergy 03/04/2015   Chronic knee pain 03/31/2014    Environmental allergies 10/16/2013   Gluten intolerance 10/16/2013   Migraine headache 04/27/2013   GAD (generalized anxiety disorder) 04/27/2013   Chronic back pain 04/27/2013    PCP: Jeanie Sewer NP  REFERRING PROVIDER: Aundra Dubin, PA-C   REFERRING DIAG: [phone] (ICD-10-CM) - Pain in left hip   THERAPY DIAG:  Pain in left hip  Muscle weakness (generalized)  ONSET DATE: several months   SUBJECTIVE:   SUBJECTIVE STATEMENT:  Patient says she is doing much better. Reports about 90% improvement overall since starting therapy.   PERTINENT HISTORY: Scoliosis, Fibro,   PAIN:  Are you having pain? No  PRECAUTIONS: None  WEIGHT BEARING RESTRICTIONS No  FALLS:  Has patient fallen in last 6 months? No  LIVING ENVIRONMENT: Lives with: lives with their partner Lives in: House/apartment  OCCUPATION: Med tech   PLOF: Independent  PATIENT GOALS improve pain free mobility   OBJECTIVE:   DIAGNOSTIC FINDINGS: xrays unremarkable   PATIENT SURVEYS:  FOTO 99% (was 59% function)   COGNITION:  Overall cognitive status: Within functional limits for tasks assessed      PALPATION: Min TTP about LT anterior hip joint (psoas/ rectus femoris)  LE ROM: PROM WFL, pain free    LE MMT:  MMT Right 10/20/2021 Right 11/09/2021 Left 10/20/2021 Left 11/09/2021  Hip flexion 4+ 5 4 4+  Hip extension 4+ _0 Hip abduction 4+ _1 Hip adduction      Hip internal rotation      Hip external rotation      Knee flexion      Knee extension 5  5   Ankle dorsiflexion 5  5   Ankle plantarflexion      Ankle inversion      Ankle eversion       (Blank rows = not tested)   GAIT: Unremarkable   TODAY'S TREATMENT:  11/09/21 Reassess MMT FOTO Band sidestepping GTB 1 RT Monster walk FWD/ Retro GTB 1RT  11/04/21  Squat then heel raise 10x 5" no HHA  Forward lunge with UE flexion 2X10 onto 4" step  Vector stance 5x 5"on foam no HHA  Hip opener then plie  Hip flexor  fast movement then hold  Cable walk outs each direction 5RT 4PL (forward, retro, sidestep each direction)  6in forward step up 10x no HHA  6in lateral step up 10x  6in step down 10x  Hip flexor stretch 3x 30" on 12in step  Hip flexion with RTB BLE 10x 5" holds  11/03/21  Standing hip flexion 10X2 sets each LE with HHA  Hip extension RTB 10X2  Hip abduction RTB 10X2  Forward lunge no UE 2X10 onto 4" step  Cable walk outs each direction 5RT 4PL   Squats 2X10  Vectors 10X5" each with 1 HHA  Slant board stretch 3X20"  PATIENT EDUCATION:  Education details: updated HEP Person educated: Patient Education method: Explanation and Demonstration Education comprehension: verbalized understanding and returned demonstration   HOME EXERCISE PROGRAM: 10/11/21 SLR series (SLR/ abduction/ extension)  10/19/21 Access Code: [email or web address] URL: [email or web address] Date: 10/19/2021 Prepared by: Leota Jacobsen  Exercises - Hip Abduction with Resistance Loop  - 1 x daily - 7 x weekly - 3 sets - 10 reps - Hip Extension with Resistance Loop  - 1 x daily - 7 x weekly - 3 sets - 10 reps - Standing Hip Flexion with Resistance Loop  - 1 x daily - 7 x weekly - 3 sets - 10 reps   10/20/21 Access Code: [email or web address] URL: [email or web address] Date: 10/20/2021 Prepared by: Leota Jacobsen  Exercises - Seated Table Hamstring Stretch  - 2 x daily - 7 x weekly - 1 sets - 3 reps - 15 hold - Supine Quadriceps Stretch with Strap on Table  - 2 x daily - 7 x weekly - 1 sets - 3 reps - 15 hold - Seated Piriformis Stretch  - 2 x daily - 7 x weekly - 1 sets - 3 reps - 15 hold - Standing Gastroc Stretch  - 2 x daily - 7 x weekly - 1 sets - 3 reps - 15 hold  ASSESSMENT:   CLINICAL IMPRESSION: Patient has made very good progress and has currently met all therapy goals. Reviewed HEP and answered all patient questions. Patient DC today with all goals met. Encouraged patient to follow up with  therapy services with any further questions or concerns.    OBJECTIVE IMPAIRMENTS decreased activity tolerance, decreased strength, increased fascial restrictions, and pain.   ACTIVITY LIMITATIONS cleaning, community activity, occupation, Medical sales representative, yard work, shopping, and yard work.   PERSONAL FACTORS  none  are also affecting patient's functional outcome.    REHAB POTENTIAL: Good  CLINICAL DECISION MAKING: Stable/uncomplicated  EVALUATION COMPLEXITY: Low   GOALS: SHORT TERM GOALS: Target date: 11/23/2021  Patient will be independent with initial  HEP and self-management strategies to improve functional outcomes Baseline:  Goal status: MET  LONG TERM GOALS: Target date: 12/07/2021  Patient will be independent with advanced HEP and self-management strategies to improve functional outcomes Baseline: Reviewed HEP and answered all questions  Goal status: MET  2.  Patient will improve FOTO score to predicted value to indicate improvement in functional outcomes Baseline: 99% Goal status: MET  3.  Patient will report reduction of LT hip pain to less than 2/10 during WB for improved quality of life and ability to perform ADLs  Baseline: 0/10  Goal status: MET  4. Patient will have equal to or > 4+/5 MMT throughout LLE to improve ability to perform functional mobility, stair ambulation and ADLs.  Baseline: See MMT Goal status: MET    PLAN: PT FREQUENCY: 1-2x/week  PT DURATION: 4 weeks  PLANNED INTERVENTIONS: Therapeutic exercises, Therapeutic activity, Neuromuscular re-education, Balance training, Gait training, Patient/Family education, Joint manipulation, Joint mobilization, Stair training, Aquatic Therapy, Dry Needling, Electrical stimulation, Spinal manipulation, Spinal mobilization, Cryotherapy, Moist heat, scar mobilization, Taping, Traction, Ultrasound, Biofeedback, Ionotophoresis 37m/ml Dexamethasone, and Manual therapy.   PLAN FOR NEXT SESSION:  DC to HEP   2:26 PM,  11/09/21 CJosue HectorPT DPT  [facility] [phone]

## 2021-11-11 ENCOUNTER — Ambulatory Visit (HOSPITAL_COMMUNITY): Payer: 59 | Admitting: Physical Therapy

## 2021-11-15 ENCOUNTER — Encounter (HOSPITAL_COMMUNITY): Payer: 59

## 2021-11-18 ENCOUNTER — Encounter (HOSPITAL_COMMUNITY): Payer: 59 | Admitting: Physical Therapy

## 2021-11-21 ENCOUNTER — Other Ambulatory Visit: Payer: Self-pay | Admitting: Nurse Practitioner

## 2021-11-21 ENCOUNTER — Other Ambulatory Visit: Payer: Self-pay | Admitting: Family

## 2021-11-21 DIAGNOSIS — E669 Obesity, unspecified: Secondary | ICD-10-CM

## 2021-11-21 DIAGNOSIS — F331 Major depressive disorder, recurrent, moderate: Secondary | ICD-10-CM

## 2021-11-21 DIAGNOSIS — F4310 Post-traumatic stress disorder, unspecified: Secondary | ICD-10-CM

## 2021-11-21 DIAGNOSIS — R635 Abnormal weight gain: Secondary | ICD-10-CM

## 2021-11-21 DIAGNOSIS — F424 Excoriation (skin-picking) disorder: Secondary | ICD-10-CM

## 2021-11-22 ENCOUNTER — Encounter (HOSPITAL_COMMUNITY): Payer: 59

## 2021-11-22 NOTE — Telephone Encounter (Signed)
ok to refill, thx

## 2021-11-25 ENCOUNTER — Encounter (HOSPITAL_COMMUNITY): Payer: 59 | Admitting: Physical Therapy

## 2021-11-29 ENCOUNTER — Encounter (HOSPITAL_COMMUNITY): Payer: 59 | Admitting: Physical Therapy

## 2021-12-02 ENCOUNTER — Encounter: Payer: Self-pay | Admitting: Family

## 2021-12-02 ENCOUNTER — Ambulatory Visit: Payer: 59 | Admitting: Family

## 2021-12-02 ENCOUNTER — Other Ambulatory Visit: Payer: Self-pay | Admitting: Family

## 2021-12-02 VITALS — BP 118/82 | HR 75 | Temp 97.8°F | Ht 67.0 in | Wt 205.1 lb

## 2021-12-02 DIAGNOSIS — E282 Polycystic ovarian syndrome: Secondary | ICD-10-CM

## 2021-12-02 DIAGNOSIS — F5102 Adjustment insomnia: Secondary | ICD-10-CM

## 2021-12-02 MED ORDER — PRAZOSIN HCL 1 MG PO CAPS: 1.0000 mg | ORAL_CAPSULE | Freq: Every day | ORAL | 0 refills | Status: DC

## 2021-12-02 MED ORDER — METFORMIN HCL ER 500 MG PO TB24: 500.0000 mg | ORAL_TABLET | Freq: Every day | ORAL | 0 refills | Status: DC

## 2021-12-02 NOTE — Progress Notes (Signed)
Subjective:     Patient ID: Dominique Davis, female    DOB: 1985-12-14, 36 y.o.   MRN: [id number]  Chief Complaint  Patient presents with   Hip Pain    Follow up for patients left hip pain that is off and on. Pt states her medication does help it a little but it is hard to sleep.    Anxiety    Pt states she has been very anxious lately and she would like to discuss medications.    HPI: PCOS:  pt reports having no menses for over a year, has been dx in past by GYN, advised that Metformin could help and she is wanting to discuss this today. Anxiety/Depression: Patient complains of anxiety disorder.   She has the following symptoms: difficulty concentrating, fatigue, insomnia, irritable, racing thoughts, nightmares .  Onset of symptoms was approximately  years ago, She denies current suicidal and homicidal ideation.  Possible organic causes contributing are: none.  Risk factors: negative life event childhood trauma and previous episode of depression  Previous treatment includes Xanax, Zoloft and individual therapy.  She complains of the following side effects from the treatment: none.    12/02/2021    4:05 PM  Depression screen PHQ 2/9  Decreased Interest 1  Down, Depressed, Hopeless 1  PHQ - 2 Score 2  Altered sleeping 3  Tired, decreased energy 3  Change in appetite 2  Feeling bad or failure about yourself  2  Trouble concentrating 2  Moving slowly or fidgety/restless 2  Suicidal thoughts 0  PHQ-9 Score 16  Difficult doing work/chores Somewhat difficult    Assessment & Plan:   Problem List Items Addressed This Visit       Endocrine   PCOS (polycystic ovarian syndrome) - Primary    Chronic - advised in the past that Meformin could help with her menstrual cycles, she reports no menses in over a year. Also may help with mild wt loss.      Relevant Medications   metFORMIN (GLUCOPHAGE-XR) 500 MG 24 hr tablet     Other   Insomnia due to stress    Chronic - had long  discussion on pt symptoms, anxiety, stressful past & current relationship with her mother. Reports PTSD type symptoms with recurrent nightmares that wake her up during the night and the she is anxious and tired all the next day. Starting Prazosin qhs, advised on use & SE.       Relevant Medications   prazosin (MINIPRESS) 1 MG capsule    Outpatient Medications Prior to Visit  Medication Sig Dispense Refill   BD PEN NEEDLE NANO 2ND GEN 32G X 4 MM MISC USE TO INJECT SAXENDA UNDER THE SKIN ONCE DAILY 100 each 1   cyclobenzaprine (FLEXERIL) 5 MG tablet Take 1 tablet (5 mg total) by mouth 3 (three) times daily as needed for muscle spasms. 30 tablet 1   diclofenac (VOLTAREN) 75 MG EC tablet Take 1 tablet (75 mg total) by mouth 2 (two) times daily as needed. 60 tablet 2   EPINEPHrine 0.3 mg/0.3 mL IJ SOAJ injection INJECT 0.3 ML (0.3 MG) INTO THE MUSCLE ONCE AS NEEDED AS DIRECTED 1 each 1   ferrous sulfate 324 MG TBEC Take 324 mg by mouth.     fluticasone (FLONASE) 50 MCG/ACT nasal spray Place 2 sprays into both nostrils daily. 16 g 6   Insulin Pen Needle 32G X 4 MM MISC Use as directed to inject Saxenda SQ QD. 100 each  1   JUNEL 1/20 1-20 MG-MCG tablet TAKE 1 TABLET BY MOUTH  DAILY AS DIRECTED 63 tablet 3   Liraglutide -Weight Management (SAXENDA) 18 MG/3ML SOPN INJECT SUBCUTANEOUSLY 0.6MG  FOR  1 WEEK. INCREASE DOSE BY 0.6MG   WEEKLY UNTIL MAX DOSE OF 3MG   DAILY IS REACHED 30 mL 5   Omega-3 Fatty Acids (FISH OIL PO) Take by mouth daily.     Probiotic Product (CULTURELLE PROBIOTICS PO) Take by mouth daily.     RESTASIS 0.05 % ophthalmic emulsion      sertraline (ZOLOFT) 100 MG tablet TAKE 2 TABLETS BY MOUTH  DAILY 180 tablet 3   sodium chloride (OCEAN) 0.65 % SOLN nasal spray Place 1 spray into both nostrils as needed for congestion. 88 mL 0   naproxen (NAPROSYN) 500 MG tablet Take 1 tablet (500 mg total) by mouth 2 (two) times daily with a meal. (Patient not taking: Reported on 12/02/2021) 30 tablet 0    No facility-administered medications prior to visit.    Past Medical History:  Diagnosis Date   Abdominal pain 05/10/2019   Anxiety    Bloating 05/10/2019   BMI 29.0-29.9,adult 08/30/2011   Depression    Dysmenorrhea    Loss of weight 11/21/2013   Migraines    Obesity    Small intestinal bacterial overgrowth AUG 2015   CIP/FLAG X7 DAYS(2X 2015)    Past Surgical History:  Procedure Laterality Date   BACTERIAL OVERGROWTH TEST N/A 01/03/2014   CANCELLED   BACTERIAL OVERGROWTH TEST N/A 01/10/2014   0 PPM AT START, 65 PPM(75 MINS), 40 PPM(105 MINS)   WISDOM TOOTH EXTRACTION     x2    Allergies  Allergen Reactions   Black Pepper [[place]] Rash       Objective:    Physical Exam Vitals and nursing note reviewed.  Constitutional:      Appearance: Normal appearance.  Cardiovascular:     Rate and Rhythm: Normal rate and regular rhythm.  Pulmonary:     Effort: Pulmonary effort is normal.     Breath sounds: Normal breath sounds.  Musculoskeletal:        General: Normal range of motion.  Skin:    General: Skin is warm and dry.  Neurological:     Mental Status: She is alert.  Psychiatric:        Mood and Affect: Mood normal.        Behavior: Behavior normal.     BP 118/82 (BP Location: Left Arm, Patient Position: Sitting, Cuff Size: Large)   Pulse 75   Temp 97.8 F (36.6 C) (Temporal)   Ht 5\' 7"  (1.702 m)   Wt 205 lb 2 oz (93 kg)   LMP  (LMP Unknown)   SpO2 99%   BMI 32.13 kg/m  Wt Readings from Last 3 Encounters:  12/02/21 205 lb 2 oz (93 kg)  08/30/21 200 lb 3.2 oz (90.8 kg)  04/28/21 197 lb (89.4 kg)   *Extra time (60min) spent with patient today which consisted of chart review, discussing diagnoses, work up, treatment, answering questions, and documentation.   Meds ordered this encounter  Medications   metFORMIN (GLUCOPHAGE-XR) 500 MG 24 hr tablet    Sig: Take 1 tablet (500 mg total) by mouth daily with breakfast.    Dispense:  30 tablet    Refill:  0     Order Specific Question:   Supervising Provider    Answer:   ANDY, CAMILLE L [[id number]]   prazosin (MINIPRESS) 1 MG  capsule    Sig: Take 1 capsule (1 mg total) by mouth at bedtime. To help with sleep and prevent nightmares.    Dispense:  30 capsule    Refill:  0    Order Specific Question:   Supervising Provider    Answer:   ANDY, CAMILLE L [[id number]]    Dulce Sellar, NP

## 2021-12-03 ENCOUNTER — Encounter (HOSPITAL_COMMUNITY): Payer: 59

## 2021-12-03 NOTE — Assessment & Plan Note (Signed)
Chronic - advised in the past that Meformin could help with her menstrual cycles, she reports no menses in over a year. Also may help with mild wt loss.

## 2021-12-03 NOTE — Assessment & Plan Note (Signed)
Chronic - had long discussion on pt symptoms, anxiety, stressful past & current relationship with her mother. Reports PTSD type symptoms with recurrent nightmares that wake her up during the night and the she is anxious and tired all the next day. Starting Prazosin qhs, advised on use & SE.

## 2021-12-04 ENCOUNTER — Encounter: Payer: Self-pay | Admitting: Family

## 2021-12-22 ENCOUNTER — Ambulatory Visit (INDEPENDENT_AMBULATORY_CARE_PROVIDER_SITE_OTHER): Payer: 59 | Admitting: Family

## 2021-12-22 ENCOUNTER — Encounter: Payer: Self-pay | Admitting: Family

## 2021-12-22 VITALS — BP 110/72 | HR 82 | Temp 98.3°F | Ht 67.0 in | Wt 202.8 lb

## 2021-12-22 DIAGNOSIS — F5102 Adjustment insomnia: Secondary | ICD-10-CM

## 2021-12-22 DIAGNOSIS — Z0001 Encounter for general adult medical examination with abnormal findings: Secondary | ICD-10-CM | POA: Diagnosis not present

## 2021-12-22 DIAGNOSIS — E282 Polycystic ovarian syndrome: Secondary | ICD-10-CM | POA: Diagnosis not present

## 2021-12-22 LAB — LIPID PANEL
Cholesterol: 182 mg/dL (ref 0–200)
HDL: 61.1 mg/dL (ref >39.00)
LDL Cholesterol: 99 mg/dL (ref 0–99)
NonHDL: 121.36
Total CHOL/HDL Ratio: 3
Triglycerides: 111 mg/dL (ref 0.0–149.0)
VLDL: 22.2 mg/dL (ref 0.0–40.0)

## 2021-12-22 LAB — COMPREHENSIVE METABOLIC PANEL
ALT: 14 U/L (ref 0–35)
AST: 14 U/L (ref 0–37)
Albumin: 4.2 g/dL (ref 3.5–5.2)
Alkaline Phosphatase: 33 U/L — ABNORMAL LOW (ref 39–117)
BUN: 12 mg/dL (ref 6–23)
CO2: 27 mEq/L (ref 19–32)
Calcium: 9.1 mg/dL (ref 8.4–10.5)
Chloride: 102 mEq/L (ref 96–112)
Creatinine, Ser: 0.87 mg/dL (ref 0.40–1.20)
GFR: 86.57 mL/min (ref >60.00)
Glucose, Bld: 86 mg/dL (ref 70–99)
Potassium: 4.8 mEq/L (ref 3.5–5.1)
Sodium: 136 mEq/L (ref 135–145)
Total Bilirubin: 0.3 mg/dL (ref 0.2–1.2)
Total Protein: 7 g/dL (ref 6.0–8.3)

## 2021-12-22 LAB — CBC WITH DIFFERENTIAL/PLATELET
Basophils Absolute: 0 10*3/uL (ref 0.0–0.1)
Basophils Relative: 0.4 % (ref 0.0–3.0)
Eosinophils Absolute: 0.1 10*3/uL (ref 0.0–0.7)
Eosinophils Relative: 1.3 % (ref 0.0–5.0)
HCT: 40.5 % (ref 36.0–46.0)
Hemoglobin: 13.5 g/dL (ref 12.0–15.0)
Lymphocytes Relative: 27.2 % (ref 12.0–46.0)
Lymphs Abs: 1.4 10*3/uL (ref 0.7–4.0)
MCHC: 33.3 g/dL (ref 30.0–36.0)
MCV: 85.6 fl (ref 78.0–100.0)
Monocytes Absolute: 0.4 10*3/uL (ref 0.1–1.0)
Monocytes Relative: 8 % (ref 3.0–12.0)
Neutro Abs: 3.3 10*3/uL (ref 1.4–7.7)
Neutrophils Relative %: 63.1 % (ref 43.0–77.0)
Platelets: 263 10*3/uL (ref 150.0–400.0)
RBC: 4.74 Mil/uL (ref 3.87–5.11)
RDW: 13.3 % (ref 11.5–15.5)
WBC: 5.3 10*3/uL (ref 4.0–10.5)

## 2021-12-22 LAB — TSH: TSH: 2.2 u[IU]/mL (ref 0.35–5.50)

## 2021-12-22 MED ORDER — METFORMIN HCL ER 500 MG PO TB24: 500.0000 mg | ORAL_TABLET | Freq: Two times a day (BID) | ORAL | 2 refills | Status: DC

## 2021-12-22 MED ORDER — PRAZOSIN HCL 2 MG PO CAPS: 2.0000 mg | ORAL_CAPSULE | Freq: Every day | ORAL | 2 refills | Status: DC

## 2021-12-22 NOTE — Assessment & Plan Note (Signed)
   chronic  tolerating metformin ER 500mg  qd  has not started menses, only taken for 3w, advised may take 2-3 mos to see a change  increasing dose to bid, refill sent  f/u in 3 mos.

## 2021-12-22 NOTE — Patient Instructions (Addendum)
It was very nice to see you today!   I will review your lab results via MyChart in a few days.   I have sent a higher dose of your Prazosin to try. You can increase the Metformin to twice a day, I have sent in a refill.  Schedule a follow up visit for 3 mos or sooner if needed.  HAPPY BIRTHDAY!!  Have a great rest of the summer :-)      PLEASE NOTE:  If you had any lab tests please let us know if you have not heard back within a few days. You may see your results on MyChart before we have a chance to review them but we will give you a call once they are reviewed by [place]. If we ordered any referrals today, please let us know if you have not heard from their office within the next week.

## 2021-12-22 NOTE — Assessment & Plan Note (Signed)
   new  started Prazosin last visit, taken for about 3w  doing better, helping w/sleep & has reduced nightmares, requesting dose increase  increasing to 2mg  qhs  f/u in 3 mos or prn

## 2021-12-22 NOTE — Progress Notes (Signed)
Phone [phone]   Subjective:   Patient is a 36 y.o. female presenting for annual physical.    Chief Complaint  Patient presents with   Annual Exam    Not Fasting W/ Labs   HPI: PCOS:  pt reports having no menses for over a year, has been dx in past by GYN, started Metformin last visit, tolerating, taking for 3 weeks, no change in menses yet. Anxiety/Depression: Patient complains of anxiety disorder.   She has the following symptoms: difficulty concentrating, fatigue, insomnia, irritable, racing thoughts, nightmares.  Onset of symptoms was approximately  years ago, She denies current suicidal and homicidal ideation.  Possible organic causes contributing are: none.  Risk factors: negative life event childhood trauma and previous episode of depression  Previous treatment includes Xanax, Zoloft and individual therapy.  She complains of the following side effects from the treatment: none. Started Prazosin last visit and med has helped her sleep and decrease number of nightmares.   See problem oriented charting- ROS- full  review of systems was completed and negative except for HPI above.   The following were reviewed and entered/updated in epic: Past Medical History:  Diagnosis Date   Abdominal pain 05/10/2019   Anxiety    Bloating 05/10/2019   BMI 29.0-29.9,adult 08/30/2011   Depression    Dysmenorrhea    Encounter for insertion of mirena IUD 03/01/2016   03/01/16    Loss of weight 11/21/2013   Migraines    Obesity    Small intestinal bacterial overgrowth AUG 2015   CIP/FLAG X7 DAYS(2X 2015)   Patient Active Problem List   Diagnosis Date Noted   Obesity (BMI 30-39.9) 09/22/2021   Abnormal weight gain 09/22/2021   PCOS (polycystic ovarian syndrome) 09/02/2020   Mild hyperlipidemia 03/18/2020   Pre-diabetes 03/18/2020   Skin-picking disorder 10/08/2019   PTSD (post-traumatic stress disorder) 08/07/2019   Insomnia due to stress 10/05/2015   MDD (major depressive  disorder) 10/05/2015   Food allergy 03/04/2015   Chronic knee pain 03/31/2014   Environmental allergies 10/16/2013   Gluten intolerance 10/16/2013   Migraine headache 04/27/2013   GAD (generalized anxiety disorder) 04/27/2013   Chronic back pain 04/27/2013   Past Surgical History:  Procedure Laterality Date   BACTERIAL OVERGROWTH TEST N/A 01/03/2014   CANCELLED   BACTERIAL OVERGROWTH TEST N/A 01/10/2014   0 PPM AT START, 46 PPM(75 MINS), 61 PPM(105 MINS)   WISDOM TOOTH EXTRACTION     x2    Family History  Problem Relation Age of Onset   Diabetes Mother        gastroparesis   Hypertension Mother    Anxiety disorder Mother    Mental illness Mother        Bipolar Disorder    Alcohol abuse Mother    Depression Mother    Drug abuse Mother    Hypertension Father    Alcohol abuse Father    Diabetes Maternal Grandmother    Hypertension Maternal Grandmother    Hypertension Paternal Grandmother    Alcohol abuse Paternal Grandfather    Inflammatory bowel disease Neg Hx    Colon cancer Neg Hx     Medications- reviewed and updated Current Outpatient Medications  Medication Sig Dispense Refill   BD PEN NEEDLE NANO 2ND GEN 32G X 4 MM MISC USE TO INJECT SAXENDA UNDER THE SKIN ONCE DAILY 100 each 1   cyclobenzaprine (FLEXERIL) 5 MG tablet Take 1 tablet (5 mg total) by mouth 3 (three) times daily as needed for  muscle spasms. 30 tablet 1   diclofenac (VOLTAREN) 75 MG EC tablet Take 1 tablet (75 mg total) by mouth 2 (two) times daily as needed. 60 tablet 2   EPINEPHrine 0.3 mg/0.3 mL IJ SOAJ injection INJECT 0.3 ML (0.3 MG) INTO THE MUSCLE ONCE AS NEEDED AS DIRECTED 1 each 1   ferrous sulfate 324 MG TBEC Take 324 mg by mouth.     fluticasone (FLONASE) 50 MCG/ACT nasal spray Place 2 sprays into both nostrils daily. 16 g 6   Insulin Pen Needle 32G X 4 MM MISC Use as directed to inject Saxenda SQ QD. 100 each 1   JUNEL 1/20 1-20 MG-MCG tablet TAKE 1 TABLET BY MOUTH  DAILY AS DIRECTED 63  tablet 3   Liraglutide -Weight Management (SAXENDA) 18 MG/3ML SOPN INJECT SUBCUTANEOUSLY 0.6MG  FOR  1 WEEK. INCREASE DOSE BY 0.6MG   WEEKLY UNTIL MAX DOSE OF 3MG   DAILY IS REACHED 30 mL 5   Omega-3 Fatty Acids (FISH OIL PO) Take by mouth daily.     Probiotic Product (CULTURELLE PROBIOTICS PO) Take by mouth daily.     RESTASIS 0.05 % ophthalmic emulsion      sertraline (ZOLOFT) 100 MG tablet TAKE 2 TABLETS BY MOUTH  DAILY 180 tablet 3   sodium chloride (OCEAN) 0.65 % SOLN nasal spray Place 1 spray into both nostrils as needed for congestion. 88 mL 0   metFORMIN (GLUCOPHAGE-XR) 500 MG 24 hr tablet Take 1 tablet (500 mg total) by mouth 2 (two) times daily after a meal. OK to take 2 pills in am instead of bid if desired. 60 tablet 2   prazosin (MINIPRESS) 2 MG capsule Take 1 capsule (2 mg total) by mouth at bedtime. To help with sleep and prevent nightmares. 30 capsule 2   No current facility-administered medications for this visit.    Allergies-reviewed and updated Allergies  Allergen Reactions   Black Pepper [Piper] Rash    Social History   Social History Narrative   Not on file   Objective  Objective:  BP 110/72 (BP Location: Left Arm, Patient Position: Sitting, Cuff Size: Large)   Pulse 82   Temp 98.3 F (36.8 C) (Temporal)   Ht 5\' 7"  (1.702 m)   Wt 202 lb 12.8 oz (92 kg)   LMP  (LMP Unknown)   SpO2 98%   BMI 31.76 kg/m  Physical Exam Vitals and nursing note reviewed.  Constitutional:      Appearance: Normal appearance.  HENT:     Head: Normocephalic.     Right Ear: Tympanic membrane normal.     Left Ear: Tympanic membrane normal.     Nose: Nose normal.     Mouth/Throat:     Mouth: Mucous membranes are moist.  Eyes:     Pupils: Pupils are equal, round, and reactive to light.  Cardiovascular:     Rate and Rhythm: Normal rate and regular rhythm.  Pulmonary:     Effort: Pulmonary effort is normal.     Breath sounds: Normal breath sounds.  Musculoskeletal:         General: Normal range of motion.     Cervical back: Normal range of motion.  Lymphadenopathy:     Cervical: No cervical adenopathy.  Skin:    General: Skin is warm and dry.  Neurological:     Mental Status: She is alert.  Psychiatric:        Mood and Affect: Mood normal.        Behavior: Behavior  normal.      Assessment and Plan   Health Maintenance counseling: 1. Anticipatory guidance: Patient counseled regarding regular dental exams q6 months, eye exams,  avoiding smoking and second hand smoke, limiting alcohol to 1 beverage per day, no illicit drugs.   2. Risk factor reduction:  Advised patient of need for regular exercise and diet rich with fruits and vegetables to reduce risk of heart attack and stroke. Wt Readings from Last 3 Encounters:  12/22/21 202 lb 12.8 oz (92 kg)  12/02/21 205 lb 2 oz (93 kg)  08/30/21 200 lb 3.2 oz (90.8 kg)   3. Immunizations/screenings/ancillary studies Immunization History  Administered Date(s) Administered   DTaP 06/15/1987, 04/12/1988, 12/11/1991   Hepatitis B 03/18/1998, 04/22/1998, 09/09/1998   IPV 04/12/1988, 12/11/1991   Influenza,inj,Quad PF,6+ Mos 04/26/2013, 03/04/2015, 06/20/2016, 07/07/2017, 01/23/2019, 03/18/2020, 04/19/2021   MMR 04/12/1988, 12/11/1991   PFIZER(Purple Top)SARS-COV-2 Vaccination 08/19/2019, 09/10/2019   Tdap 12/30/2014   Health Maintenance Due  Topic Date Due   COVID-19 Vaccine (3 - Pfizer series) 11/05/2019   PAP SMEAR-Modifier  01/22/2022    4. Cervical cancer screening: 2021 5. Skin cancer screening- advised regular sunscreen use. Denies worrisome, changing, or new skin lesions.  6. Birth control/STD check: OCPs, N/A 7. Smoking associated screening: non- smoker 8. Alcohol screening: 1-2 drinks per month  Problem List Items Addressed This Visit       Endocrine   PCOS (polycystic ovarian syndrome)    chronic tolerating metformin ER $RemoveBefo'500mg'ddJIaPYLopR$  qd has not started menses, only taken for 3w, advised may take  2-3 mos to see a change increasing dose to bid, refill sent f/u in 3 mos.      Relevant Medications   metFORMIN (GLUCOPHAGE-XR) 500 MG 24 hr tablet     Other   Insomnia due to stress    new started Prazosin last visit, taken for about 3w doing better, helping w/sleep & has reduced nightmares, requesting dose increase increasing to $RemoveBefor'2mg'WyjwjSffzkqa$  qhs f/u in 3 mos or prn      Relevant Medications   prazosin (MINIPRESS) 2 MG capsule   Other Visit Diagnoses     Encounter for general adult medical examination with abnormal findings    -  Primary   Relevant Orders   Comprehensive metabolic panel (Completed)   TSH (Completed)   Lipid panel (Completed)   CBC with Differential/Platelet (Completed)       Recommended follow up: Return in about 3 months (around 03/24/2022). Future Appointments  Date Time Provider [place]  03/24/2022  3:00 PM Jeanie Sewer, NP [facility]    Lab/Order associations: not fasting    Jeanie Sewer, NP

## 2021-12-23 NOTE — Progress Notes (Signed)
Hi [name],  All of your labs are within normal range.  Glucose (sugar), electrolytes, kidney and liver function, blood count, thyroid, and cholesterol numbers are all good.  Your alkaline phosphatase measures liver activity and not too concerned with it a little low. Keep up the good work with controlling your diet and continue to try and shoot for 30 minutes of exercise daily!

## 2021-12-27 ENCOUNTER — Ambulatory Visit: Payer: 59 | Admitting: Family

## 2022-01-08 ENCOUNTER — Other Ambulatory Visit: Payer: Self-pay | Admitting: Family

## 2022-01-08 DIAGNOSIS — F5102 Adjustment insomnia: Secondary | ICD-10-CM

## 2022-01-28 ENCOUNTER — Encounter: Payer: Self-pay | Admitting: Family

## 2022-02-14 NOTE — Telephone Encounter (Signed)
Pt returned my call in regards to [facility] paperwork. Let pt know correct dates have been put on the new sheet and faxed over. Pt gave a verbalized understanding.

## 2022-02-28 ENCOUNTER — Encounter: Payer: Self-pay | Admitting: *Deleted

## 2022-03-15 ENCOUNTER — Other Ambulatory Visit: Payer: Self-pay | Admitting: Nurse Practitioner

## 2022-03-16 NOTE — Telephone Encounter (Signed)
Unable to refill per protocol, last refill by another provider. Provider no at this practice. Will refuse.  Requested Prescriptions  Pending Prescriptions Disp Refills  . JUNEL 1/20 1-20 MG-MCG tablet [Pharmacy Med Name: Junel 1/20 1-20 MG-MCG Oral Tablet] 63 tablet 3    Sig: TAKE 1 TABLET BY MOUTH DAILY AS  DIRECTED     OB/GYN:  Contraceptives Passed - 03/15/2022 11:18 PM      Passed - Last BP in normal range    BP Readings from Last 1 Encounters:  12/22/21 110/72         Passed - Valid encounter within last 12 months    Recent Outpatient Visits          11 months ago BMI 30.0-30.9,adult   [place] Eulogio Bear, NP   1 year ago Annual physical exam   [facility] Eulogio Bear, NP   1 year ago Class 1 obesity   [place] Eulogio Bear, NP   1 year ago Left forearm pain   [place] Eulogio Bear, NP   1 year ago Class 1 obesity   [place] Eulogio Bear, NP      Future Appointments            In 1 week Jeanie Sewer, NP [place], [place] - Patient is not a smoker

## 2022-03-20 ENCOUNTER — Other Ambulatory Visit: Payer: Self-pay | Admitting: Family

## 2022-03-20 DIAGNOSIS — F5102 Adjustment insomnia: Secondary | ICD-10-CM

## 2022-03-20 DIAGNOSIS — E282 Polycystic ovarian syndrome: Secondary | ICD-10-CM

## 2022-03-24 ENCOUNTER — Ambulatory Visit: Payer: 59 | Admitting: Family

## 2022-03-28 ENCOUNTER — Ambulatory Visit: Payer: 59 | Admitting: Family

## 2022-03-28 NOTE — Progress Notes (Deleted)
Subjective:     Patient ID: Dominique Davis, female    DOB: [birth year], 36 y.o.   MRN: [id number]  No chief complaint on file.   HPI: PCOS:  pt reports having no menses for over a year, has been dx in past by GYN, advised that Metformin could help and she is wanting to discuss this today. Anxiety/Depression: Patient complains of anxiety disorder.   She has the following symptoms: difficulty concentrating, fatigue, insomnia, irritable, racing thoughts, nightmares .  Onset of symptoms was approximately  years ago, She denies current suicidal and homicidal ideation.  Possible organic causes contributing are: none.  Risk factors: negative life event childhood trauma and previous episode of depression  Previous treatment includes Xanax, Zoloft and individual therapy.  She complains of the following side effects from the treatment: none.  Assessment & Plan:   Problem List Items Addressed This Visit   None  Outpatient Medications Prior to Visit  Medication Sig Dispense Refill   BD PEN NEEDLE NANO 2ND GEN 32G X 4 MM MISC USE TO INJECT SAXENDA UNDER THE SKIN ONCE DAILY 100 each 1   cyclobenzaprine (FLEXERIL) 5 MG tablet Take 1 tablet (5 mg total) by mouth 3 (three) times daily as needed for muscle spasms. 30 tablet 1   diclofenac (VOLTAREN) 75 MG EC tablet Take 1 tablet (75 mg total) by mouth 2 (two) times daily as needed. 60 tablet 2   EPINEPHrine 0.3 mg/0.3 mL IJ SOAJ injection INJECT 0.3 ML (0.3 MG) INTO THE MUSCLE ONCE AS NEEDED AS DIRECTED 1 each 1   ferrous sulfate 324 MG TBEC Take 324 mg by mouth.     fluticasone (FLONASE) 50 MCG/ACT nasal spray Place 2 sprays into both nostrils daily. 16 g 6   Insulin Pen Needle 32G X 4 MM MISC Use as directed to inject Saxenda SQ QD. 100 each 1   JUNEL 1/20 1-20 MG-MCG tablet TAKE 1 TABLET BY MOUTH  DAILY AS DIRECTED 63 tablet 3   Liraglutide -Weight Management (SAXENDA) 18 MG/3ML SOPN INJECT SUBCUTANEOUSLY 0.6MG  FOR  1 WEEK. INCREASE DOSE BY 0.6MG    WEEKLY UNTIL MAX DOSE OF 3MG   DAILY IS REACHED 30 mL 5   metFORMIN (GLUCOPHAGE-XR) 500 MG 24 hr tablet TAKE 1 TABLET(500 MG) BY MOUTH TWICE DAILY AFTER A MEAL. OK TO TAKE 2 TABLETS IN THE MORNING INSTEAD OF TWICE DAILY IF DESIRED 60 tablet 2   Omega-3 Fatty Acids (FISH OIL PO) Take by mouth daily.     prazosin (MINIPRESS) 2 MG capsule TAKE 1 CAPSULE BY MOUTH EVERY NIGHT AT BEDTIME TO HELP WITH SLEEP AND PREVENT MIGRAINES 30 capsule 2   Probiotic Product (CULTURELLE PROBIOTICS PO) Take by mouth daily.     RESTASIS 0.05 % ophthalmic emulsion      sertraline (ZOLOFT) 100 MG tablet TAKE 2 TABLETS BY MOUTH  DAILY 180 tablet 3   sodium chloride (OCEAN) 0.65 % SOLN nasal spray Place 1 spray into both nostrils as needed for congestion. 88 mL 0   No facility-administered medications prior to visit.    Past Medical History:  Diagnosis Date   Abdominal pain 05/10/2019   Anxiety    Bloating 05/10/2019   BMI 29.0-29.9,adult 08/30/2011   Depression    Dysmenorrhea    Encounter for insertion of mirena IUD 03/01/2016   03/01/16    Loss of weight 11/21/2013   Migraines    Obesity    Small intestinal bacterial overgrowth AUG 2015   CIP/FLAG X7 DAYS(2X  2015)    Past Surgical History:  Procedure Laterality Date   BACTERIAL OVERGROWTH TEST N/A 01/03/2014   CANCELLED   BACTERIAL OVERGROWTH TEST N/A 01/10/2014   0 PPM AT START, 65 PPM(75 MINS), 58 PPM(105 MINS)   WISDOM TOOTH EXTRACTION     x2    Allergies  Allergen Reactions   Black Pepper [Piper] Rash       Objective:    Physical Exam Vitals and nursing note reviewed.  Constitutional:      Appearance: Normal appearance.  Cardiovascular:     Rate and Rhythm: Normal rate and regular rhythm.  Pulmonary:     Effort: Pulmonary effort is normal.     Breath sounds: Normal breath sounds.  Musculoskeletal:        General: Normal range of motion.  Skin:    General: Skin is warm and dry.  Neurological:     Mental Status: She is alert.   Psychiatric:        Mood and Affect: Mood normal.        Behavior: Behavior normal.     There were no vitals taken for this visit. Wt Readings from Last 3 Encounters:  12/22/21 202 lb 12.8 oz (92 kg)  12/02/21 205 lb 2 oz (93 kg)  08/30/21 200 lb 3.2 oz (90.8 kg)   No orders of the defined types were placed in this encounter.   Jeanie Sewer, NP

## 2022-03-29 ENCOUNTER — Encounter: Payer: Self-pay | Admitting: Family Medicine

## 2022-03-29 ENCOUNTER — Ambulatory Visit: Payer: 59 | Admitting: Family Medicine

## 2022-03-29 VITALS — BP 108/60 | HR 91 | Temp 98.2°F | Ht 67.0 in | Wt 209.1 lb

## 2022-03-29 DIAGNOSIS — Z6830 Body mass index (BMI) 30.0-30.9, adult: Secondary | ICD-10-CM

## 2022-03-29 DIAGNOSIS — R112 Nausea with vomiting, unspecified: Secondary | ICD-10-CM

## 2022-03-29 DIAGNOSIS — R635 Abnormal weight gain: Secondary | ICD-10-CM

## 2022-03-29 DIAGNOSIS — E669 Obesity, unspecified: Secondary | ICD-10-CM

## 2022-03-29 DIAGNOSIS — R197 Diarrhea, unspecified: Secondary | ICD-10-CM

## 2022-03-29 LAB — POC COVID19 BINAXNOW: SARS Coronavirus 2 Ag: NEGATIVE

## 2022-03-29 MED ORDER — DIPHENOXYLATE-ATROPINE 2.5-0.025 MG PO TABS: 1.0000 | ORAL_TABLET | Freq: Four times a day (QID) | ORAL | 0 refills | Status: DC | PRN

## 2022-03-29 MED ORDER — SAXENDA 18 MG/3ML ~~LOC~~ SOPN: PEN_INJECTOR | SUBCUTANEOUS | 5 refills | Status: DC

## 2022-03-29 NOTE — Patient Instructions (Addendum)
Electrolytes, small amounts of fluids frequently  If heart racing, less urine output, dry, ER

## 2022-03-29 NOTE — Addendum Note (Signed)
Addended by: Verne Grain on: 03/29/2022 02:29 PM   Modules accepted: Orders

## 2022-03-29 NOTE — Progress Notes (Signed)
Subjective:     Patient ID: Dominique Davis, female    DOB: [birth year], 36 y.o.   MRN: [id number]  Chief Complaint  Patient presents with   Diarrhea    Started on Saturday    Nausea    Vomited and nausea on Saturday    Sinus Problem    Sinus pressure around both eyes   Dizziness    Had some dizziness off and on since Saturday    HPI  Diarrhea since 10/21-a lot-very watery.  and some n/v x1 as well.  Intermitt dizziness since then as well.  Pepto not working.  Drinking fluids.  Eating.   Stools were dark before pepto.  Strange smell.   Sinus [place] around both [place]  Does have seasonal allergies.  Using flonase.  Runny nose.  No f/c.  No sore throat/cough.  .  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 30 tablet 1   diclofenac (VOLTAREN) 75 MG EC tablet Take 1 tablet (75 mg total) by mouth 2 (two) times daily as needed. 60 tablet 2   EPINEPHrine 0.3 mg/0.3 mL IJ SOAJ injection INJECT 0.3 ML (0.3 MG)  INTO THE MUSCLE ONCE AS NEEDED AS DIRECTED 1 each 1   ferrous sulfate 324 MG TBEC Take 324 mg by mouth.     fluticasone (FLONASE) 50 MCG/ACT nasal spray Place 2 sprays into both nostrils daily. 16 g 6   Insulin Pen Needle 32G X 4 MM MISC Use as directed to inject Saxenda SQ QD. 100 each 1   JUNEL 1/20 1-20 MG-MCG tablet TAKE 1 TABLET BY MOUTH  DAILY AS DIRECTED 63 tablet 3   Liraglutide -Weight Management (SAXENDA) 18 MG/3ML SOPN INJECT SUBCUTANEOUSLY 0.6MG  FOR  1 WEEK. INCREASE DOSE BY 0.6MG   WEEKLY UNTIL MAX DOSE OF 3MG   DAILY IS REACHED 30 mL 5   metFORMIN (GLUCOPHAGE-XR) 500 MG 24 hr tablet TAKE 1 TABLET(500 MG) BY MOUTH TWICE DAILY AFTER A MEAL. OK TO TAKE 2 TABLETS IN THE MORNING INSTEAD OF TWICE DAILY IF DESIRED 60 tablet 2   Omega-3 Fatty Acids (FISH OIL PO) Take by mouth daily.     prazosin (MINIPRESS) 2 MG capsule TAKE 1 CAPSULE BY MOUTH EVERY NIGHT AT BEDTIME TO HELP WITH SLEEP AND PREVENT MIGRAINES 30 capsule 2   Probiotic Product (CULTURELLE  PROBIOTICS PO) Take by mouth daily.     RESTASIS 0.05 % ophthalmic emulsion      sertraline (ZOLOFT) 100 MG tablet TAKE 2 TABLETS BY MOUTH  DAILY 180 tablet 3   sodium chloride (OCEAN) 0.65 % SOLN nasal spray Place 1 spray into both nostrils as needed for congestion. 88 mL 0   No facility-administered medications prior to visit.    Allergies  Allergen Reactions   Black Pepper [Piper] Rash   ROS neg/noncontributory except as noted HPI/below      Objective:     BP 108/60   Pulse 91   Temp 98.2 F (36.8 C) (Temporal)   Ht 5\' 7"  (1.702 m)   Wt 209 lb 2 oz (94.9 kg)   LMP 03/22/2022 (Exact Date)   SpO2 98%   BMI 32.75 kg/m  Wt Readings from Last 3 Encounters:  03/29/22 209 lb 2 oz (94.9 kg)  12/22/21 202 lb 12.8 oz (92 kg)  12/02/21 205 lb 2 oz (93 kg)    Physical Exam   Gen: WDWN NAD HEENT: NCAT, conjunctiva not injected, sclera nonicteric TM WNL B, OP moist, no exudates  NECK:  supple, no thyromegaly, no nodes, no  carotid bruits CARDIAC: RRR, S1S2+, no murmur. DP 2+B LUNGS: CTAB. No wheezes ABDOMEN:  BS+, soft, NTND, No HSM, no masses EXT:  no edema MSK: no gross abnormalities.  NEURO: A&O x3.  CN II-XII intact.  PSYCH: normal mood. Good eye contact  Results for orders placed or performed in visit on 03/29/22  POC COVID-19  Result Value Ref Range   SARS Coronavirus 2 Ag Negative Negative        Assessment & Plan:   Problem List Items Addressed This Visit   None Visit Diagnoses     Diarrhea, unspecified type    -  Primary   Relevant Orders   POC COVID-19 (Completed)   Comprehensive metabolic panel   Magnesium   CBC with Differential/Platelet   Nausea and vomiting, unspecified vomiting type       Relevant Orders   POC COVID-19 (Completed)   Comprehensive metabolic panel   Magnesium   CBC with Differential/Platelet      Diarrhea-suspect viral probably.  D/t dizziness-check cbc,cmp,mg.  Inc fluids.  Brat diet. If getting more dizzy, less urination, dehydrated, ER  No orders of the defined types were placed in this encounter.   03/31/22, MD

## 2022-03-29 NOTE — Addendum Note (Signed)
Addended by: Loura Back on: 03/29/2022 02:08 PM   Modules accepted: Orders

## 2022-03-30 LAB — SPECIMEN STATUS REPORT

## 2022-04-04 ENCOUNTER — Ambulatory Visit: Payer: 59 | Admitting: Family

## 2022-04-04 ENCOUNTER — Encounter: Payer: Self-pay | Admitting: Family

## 2022-04-04 VITALS — BP 110/73 | HR 83 | Temp 97.6°F | Ht 67.0 in | Wt 208.1 lb

## 2022-04-04 DIAGNOSIS — Z23 Encounter for immunization: Secondary | ICD-10-CM

## 2022-04-04 DIAGNOSIS — F411 Generalized anxiety disorder: Secondary | ICD-10-CM | POA: Diagnosis not present

## 2022-04-04 DIAGNOSIS — R635 Abnormal weight gain: Secondary | ICD-10-CM

## 2022-04-04 DIAGNOSIS — E282 Polycystic ovarian syndrome: Secondary | ICD-10-CM | POA: Diagnosis not present

## 2022-04-04 MED ORDER — METFORMIN HCL ER 500 MG PO TB24: 1000.0000 mg | ORAL_TABLET | Freq: Every day | ORAL | 3 refills | Status: DC

## 2022-04-04 MED ORDER — SAXENDA 18 MG/3ML ~~LOC~~ SOPN: PEN_INJECTOR | SUBCUTANEOUS | 5 refills | Status: DC

## 2022-04-04 MED ORDER — BUPROPION HCL ER (SR) 100 MG PO TB12: 100.0000 mg | ORAL_TABLET | Freq: Every morning | ORAL | 3 refills | Status: DC

## 2022-04-04 NOTE — Assessment & Plan Note (Signed)
   chronic  pt started Metformin, increased to 2 pills qd and she also did an herbal tx that she thinks helped, but her cycle has restarted  advised to continue the Metformin  refilling today, f/u in 4 mos

## 2022-04-04 NOTE — Assessment & Plan Note (Signed)
   chronic  also hx of Depression & PTSD  Taking Zoloft 200mg  qhs, Prazosin 2mg  qhs  active in therapy  having low labido & wt gain  discussed options of switching to new med (SNRI or Prozac) vs. augmenting with Bupropion  sending Bupropion 100mg  SR qam, advised on use & SE  f/u in 1 month with Mychart update on med, 3 mos in office

## 2022-04-04 NOTE — Progress Notes (Signed)
Patient ID: Dominique Davis, female    DOB: 1986/05/08, 36 y.o.   MRN: [id number]  Chief Complaint  Patient presents with   Follow-up    HPI: PCOS:  pt reports having no menses for over a year, has been dx in past by GYN, advised that Metformin could help and she is wanting to discuss this today. Anxiety/Depression: Patient complains of anxiety disorder.   She has the following symptoms: difficulty concentrating, fatigue, insomnia, irritable, racing thoughts, nightmares. Onset of symptoms was approximately  years ago, She denies current suicidal and homicidal ideation. Possible organic causes contributing are: none.  Risk factors: negative life event childhood trauma and previous episode of depression  Previous treatment includes Xanax, Zoloft and individual therapy.  She complains of the following side effects from the treatment: lack of labido and weight gain. Abnormal wt gain:  pt has been taking Saxenda 3mg  qd, ran out a few days as [facility] has not had in stock. She wants to see if we can send to [facility] and they may be able to fill RX.    Assessment & Plan:   Problem List Items Addressed This Visit       Endocrine   PCOS (polycystic ovarian syndrome)    chronic pt started Metformin, increased to 2 pills qd and she also did an herbal tx that she thinks helped, but her cycle has restarted advised to continue the Metformin refilling today, f/u in 4 mos      Relevant Medications   metFORMIN (GLUCOPHAGE-XR) 500 MG 24 hr tablet     Other   GAD (generalized anxiety disorder) - Primary    chronic also hx of Depression & PTSD Taking Zoloft 200mg  qhs, Prazosin 2mg  qhs active in therapy having low labido & wt gain discussed options of switching to new med (SNRI or Prozac) vs. augmenting with Bupropion sending Bupropion 100mg  SR qam, advised on use & SE f/u in 1 month with Mychart update on med, 3 mos in office      Relevant Medications   buPROPion ER (WELLBUTRIN SR) 100 MG  12 hr tablet   Abnormal weight gain    chronic hx of using Ozempic, but then could no longer get this, now on Saxenda she does not think it is working as well, on max dose of 3mg  qd ran out a few days ago d/t supply refilling with [facility] f/u in 4 mos      Relevant Medications   Liraglutide -Weight Management (SAXENDA) 18 MG/3ML SOPN   Other Visit Diagnoses     Need for immunization against influenza       Relevant Orders   Flu Vaccine QUAD 6+ mos PF IM (Fluarix Quad PF) (Completed)       Subjective:    Outpatient Medications Prior to Visit  Medication Sig Dispense Refill   BD PEN NEEDLE NANO 2ND GEN 32G X 4 MM MISC USE TO INJECT SAXENDA UNDER THE SKIN ONCE DAILY 100 each 1   cyclobenzaprine (FLEXERIL) 5 MG tablet Take 1 tablet (5 mg total) by mouth 3 (three) times daily as needed for muscle spasms. 30 tablet 1   diclofenac (VOLTAREN) 75 MG EC tablet Take 1 tablet (75 mg total) by mouth 2 (two) times daily as needed. 60 tablet 2   diphenoxylate-atropine (LOMOTIL) 2.5-0.025 MG tablet Take 1 tablet by mouth 4 (four) times daily as needed for diarrhea or loose stools. 10 tablet 0   EPINEPHrine 0.3 mg/0.3 mL IJ SOAJ injection INJECT 0.3  ML (0.3 MG) INTO THE MUSCLE ONCE AS NEEDED AS DIRECTED 1 each 1   ferrous sulfate 324 MG TBEC Take 324 mg by mouth.     fluticasone (FLONASE) 50 MCG/ACT nasal spray Place 2 sprays into both nostrils daily. 16 g 6   Insulin Pen Needle 32G X 4 MM MISC Use as directed to inject Saxenda SQ QD. 100 each 1   JUNEL 1/20 1-20 MG-MCG tablet TAKE 1 TABLET BY MOUTH  DAILY AS DIRECTED 63 tablet 3   Omega-3 Fatty Acids (FISH OIL PO) Take by mouth daily.     prazosin (MINIPRESS) 2 MG capsule TAKE 1 CAPSULE BY MOUTH EVERY NIGHT AT BEDTIME TO HELP WITH SLEEP AND PREVENT MIGRAINES 30 capsule 2   Probiotic Product (CULTURELLE PROBIOTICS PO) Take by mouth daily.     RESTASIS 0.05 % ophthalmic emulsion      sertraline (ZOLOFT) 100 MG tablet TAKE 2 TABLETS BY MOUTH   DAILY 180 tablet 3   sodium chloride (OCEAN) 0.65 % SOLN nasal spray Place 1 spray into both nostrils as needed for congestion. 88 mL 0   Liraglutide -Weight Management (SAXENDA) 18 MG/3ML SOPN INJECT SUBCUTANEOUSLY 0.6MG  FOR  1 WEEK. INCREASE DOSE BY 0.6MG   WEEKLY UNTIL MAX DOSE OF 3MG   DAILY IS REACHED 30 mL 5   metFORMIN (GLUCOPHAGE-XR) 500 MG 24 hr tablet TAKE 1 TABLET(500 MG) BY MOUTH TWICE DAILY AFTER A MEAL. OK TO TAKE 2 TABLETS IN THE MORNING INSTEAD OF TWICE DAILY IF DESIRED 60 tablet 2   No facility-administered medications prior to visit.   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  Constitutional:      Appearance: Normal appearance.  Cardiovascular:     Rate and Rhythm: Normal rate and regular rhythm.  Pulmonary:     Effort: Pulmonary effort is normal.     Breath sounds: Normal breath sounds.  Musculoskeletal:        General: Normal range of motion.  Skin:    General: Skin is warm and dry.  Neurological:     Mental Status: She is alert.  Psychiatric:        Mood and Affect: Mood normal.        Behavior: Behavior normal.    BP 110/73 (BP Location: Left Arm, Patient Position: Sitting, Cuff Size: Large)   Pulse 83   Temp 97.6 F (36.4 C) (Temporal)   Ht 5\' 7"  (1.702 m)   Wt 208 lb 2 oz (94.4  kg)   LMP 03/22/2022 (Exact Date)   SpO2 97%   BMI 32.60 kg/m  Wt Readings from Last 3 Encounters:  04/04/22 208 lb 2 oz (94.4 kg)  03/29/22  209 lb 2 oz (94.9 kg)  12/22/21 202 lb 12.8 oz (92 kg)       Dulce Sellar, NP

## 2022-04-04 NOTE — Assessment & Plan Note (Signed)
   chronic  hx of using Ozempic, but then could no longer get this, now on Saxenda  she does not think it is working as well, on max dose of 3mg  qd  ran out a few days ago d/t supply  refilling with [facility]  f/u in 4 mos

## 2022-04-05 LAB — HEMOGLOBIN A1C
Est. average glucose Bld gHb Est-mCnc: 105 mg/dL
Hgb A1c MFr Bld: 5.3 % (ref 4.8–5.6)

## 2022-04-05 LAB — SPECIMEN STATUS REPORT

## 2022-04-14 ENCOUNTER — Encounter: Payer: Self-pay | Admitting: Orthopaedic Surgery

## 2022-04-30 ENCOUNTER — Other Ambulatory Visit: Payer: Self-pay | Admitting: Family

## 2022-04-30 ENCOUNTER — Other Ambulatory Visit: Payer: Self-pay | Admitting: Nurse Practitioner

## 2022-04-30 DIAGNOSIS — F5102 Adjustment insomnia: Secondary | ICD-10-CM

## 2022-04-30 DIAGNOSIS — E282 Polycystic ovarian syndrome: Secondary | ICD-10-CM

## 2022-05-03 ENCOUNTER — Encounter: Payer: Self-pay | Admitting: Family

## 2022-05-05 ENCOUNTER — Telehealth (INDEPENDENT_AMBULATORY_CARE_PROVIDER_SITE_OTHER): Payer: 59 | Admitting: Family

## 2022-05-05 ENCOUNTER — Encounter: Payer: Self-pay | Admitting: Family

## 2022-05-05 VITALS — Ht 67.0 in | Wt 208.0 lb

## 2022-05-05 DIAGNOSIS — Z3041 Encounter for surveillance of contraceptive pills: Secondary | ICD-10-CM | POA: Diagnosis not present

## 2022-05-05 DIAGNOSIS — E669 Obesity, unspecified: Secondary | ICD-10-CM

## 2022-05-05 DIAGNOSIS — R635 Abnormal weight gain: Secondary | ICD-10-CM

## 2022-05-05 MED ORDER — NORETHINDRONE ACET-ETHINYL EST 1-20 MG-MCG PO TABS: 1.0000 | ORAL_TABLET | Freq: Every day | ORAL | 3 refills | Status: DC

## 2022-05-05 NOTE — Assessment & Plan Note (Signed)
chronic hx of using Ozempic, but then could no longer get this, then Saxenda, but she regained some weight after reaching max dose and ins has told her they will no longer cover  will refer to wt mgt clinic  continue to advise on wt loss strategies f/u prn

## 2022-05-05 NOTE — Progress Notes (Signed)
MyChart Video Visit    Virtual Visit via Video Note   This format is felt to be most appropriate for this patient at this time. Physical exam was limited by quality of the video and audio technology used for the visit. CMA was able to get the patient set up on a video visit.  Patient location: Home. Patient and provider in visit Provider location: Office  I discussed the limitations of evaluation and management by telemedicine and the availability of in person appointments. The patient expressed understanding and agreed to proceed.  Visit Date: 05/05/2022  Today's healthcare provider: Dulce Sellar, NP     Subjective:   Patient ID: Dominique Davis, female    DOB: Nov 15, 1985, 36 y.o.   MRN: [id number]  Chief Complaint  Patient presents with   Medication Refill    Needing birth control refilled in cart and wanting to discuss coming in office to let PCP do her Pap for this year;     HPI Abnormal wt gain:  pt has been taking Saxenda 3mg  qd, ran out as her pharmacy did not have in stock, then heard from her ins that they are no longer covering as she gained a little weight while taking and did not lose the percentage needed to remain on medication. Pt asking about other options.   Birth control pill refill: has been taking Junel for over a year, no concerns. Will schedule appt for PAP smear in office.  Assessment & Plan:   Problem List Items Addressed This Visit       Other   Obesity (BMI 30-39.9)   Relevant Orders   Amb Ref to Medical Weight Management   Abnormal weight gain - Primary    chronic hx of using Ozempic, but then could no longer get this, then Saxenda, but she regained some weight after reaching max dose and ins has told her they will no longer cover  will refer to wt mgt clinic  continue to advise on wt loss strategies f/u prn      Relevant Orders   Amb Ref to Medical Weight Management   Other Visit Diagnoses     Encounter for birth control  pills maintenance       Relevant Medications   norethindrone-ethinyl estradiol (JUNEL 1/20) 1-20 MG-MCG tablet      Past Medical History:  Diagnosis Date   Abdominal pain 05/10/2019   Anxiety    Bloating 05/10/2019   BMI 29.0-29.9,adult 08/30/2011   Depression    Dysmenorrhea    Encounter for insertion of mirena IUD 03/01/2016   03/01/16    Loss of weight 11/21/2013   Migraines    Obesity    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 30 tablet 3   diclofenac (VOLTAREN) 75 MG EC tablet Take 1 tablet (75 mg total) by mouth 2 (two) times daily as needed. 60 tablet 2   EPINEPHrine 0.3 mg/0.3 mL IJ SOAJ injection INJECT 0.3 ML (0.3 MG)  INTO THE MUSCLE ONCE AS NEEDED AS DIRECTED 1 each 1   ferrous sulfate 324 MG TBEC Take 324 mg by mouth.     fluticasone (FLONASE) 50 MCG/ACT nasal spray Place 2 sprays into both nostrils daily. 16 g 6   metFORMIN (GLUCOPHAGE-XR) 500 MG 24 hr tablet TAKE 1 TABLET BY MOUTH TWICE  DAILY AFTER A MEAL.OK TO TAKE 2  TABLETS IN THE MORNING INSTEAD  OF TWICE DAILY IF DESIRED. 180 tablet 3   Omega-3 Fatty Acids (FISH OIL PO) Take by mouth daily.     prazosin (MINIPRESS) 2 MG capsule TAKE 1 CAPSULE BY MOUTH AT  BEDTIME TO HELP WITH SLEEP AND  PREVENT NIGHTMARES 90 capsule 3   Probiotic Product (CULTURELLE PROBIOTICS PO) Take by mouth daily.     RESTASIS 0.05 % ophthalmic emulsion      sertraline (ZOLOFT) 100 MG tablet TAKE 2 TABLETS BY MOUTH  DAILY 180 tablet 3   sodium chloride (OCEAN) 0.65 % SOLN nasal spray Place 1 spray  into both nostrils as needed for congestion. 88 mL 0   BD PEN NEEDLE NANO 2ND GEN 32G X 4 MM MISC USE TO INJECT SAXENDA UNDER THE SKIN ONCE DAILY 100 each 1   cyclobenzaprine (FLEXERIL) 5 MG tablet Take 1 tablet (5 mg total) by mouth 3 (three) times daily as needed for muscle spasms. 30 tablet 1   diphenoxylate-atropine (LOMOTIL) 2.5-0.025 MG tablet Take 1 tablet by mouth 4 (four) times daily as needed for diarrhea or loose stools. 10 tablet 0   Insulin Pen Needle 32G X 4 MM MISC Use as directed to inject Saxenda SQ QD. 100 each 1   JUNEL 1/20 1-20 MG-MCG tablet TAKE 1 TABLET BY MOUTH  DAILY AS DIRECTED 63 tablet 3   Liraglutide -Weight Management (SAXENDA) 18 MG/3ML SOPN INJECT SUBCUTANEOUSLY 0.6MG  FOR  1 WEEK. INCREASE DOSE BY 0.6MG   WEEKLY UNTIL MAX DOSE OF 3MG   DAILY IS REACHED (Patient not taking: Reported on 05/05/2022) 30 mL 5   No facility-administered medications prior to visit.    Allergies  Allergen Reactions   Black Pepper [Piper] Rash       Objective:   Physical Exam Vitals and nursing note reviewed.  Constitutional:      General: Pt is not in acute distress.    Appearance: Normal appearance.  HENT:     Head: Normocephalic.  Pulmonary:     Effort: No respiratory distress.  Musculoskeletal:     Cervical back: Normal range of motion.  Skin:    General: Skin is dry.     Coloration: Skin is not pale.  Neurological:     Mental Status: Pt is alert and oriented to person, place, and time.  Psychiatric:        Mood and Affect: Mood normal.   Ht 5\' 7"  (1.702 m)   Wt 208 lb (94.3 kg)   BMI 32.58 kg/m   Wt Readings from Last 3 Encounters:  05/05/22 208 lb (94.3 kg)  04/04/22 208 lb 2 oz (94.4 kg)  03/29/22 209 lb 2 oz (94.9 kg)        I discussed the assessment and treatment plan with the patient. The patient was provided an opportunity to ask questions and all were answered. The patient agreed with the plan and demonstrated an understanding of the instructions.    The patient was advised to call back or seek an in-person evaluation if the symptoms worsen or if the condition fails to improve as anticipated.  Dulce Sellar, NP [facility] [place] [phone] (phone) [phone] (fax)  [facility]

## 2022-06-20 ENCOUNTER — Ambulatory Visit: Payer: 59 | Admitting: Family

## 2022-06-20 ENCOUNTER — Encounter: Payer: Self-pay | Admitting: Family

## 2022-06-20 ENCOUNTER — Other Ambulatory Visit (HOSPITAL_COMMUNITY)
Admission: RE | Admit: 2022-06-20 | Discharge: 2022-06-20 | Disposition: A | Discharge status: Home / Self Care | Payer: 59 | Source: Ambulatory Visit | Attending: Family | Admitting: Family

## 2022-06-20 VITALS — BP 128/84 | HR 83 | Temp 98.3°F | Ht 67.0 in | Wt 218.4 lb

## 2022-06-20 DIAGNOSIS — Z124 Encounter for screening for malignant neoplasm of cervix: Secondary | ICD-10-CM

## 2022-06-20 DIAGNOSIS — R7303 Prediabetes: Secondary | ICD-10-CM

## 2022-06-20 DIAGNOSIS — R635 Abnormal weight gain: Secondary | ICD-10-CM

## 2022-06-20 DIAGNOSIS — E282 Polycystic ovarian syndrome: Secondary | ICD-10-CM | POA: Diagnosis not present

## 2022-06-20 DIAGNOSIS — E669 Obesity, unspecified: Secondary | ICD-10-CM

## 2022-06-20 DIAGNOSIS — F411 Generalized anxiety disorder: Secondary | ICD-10-CM

## 2022-06-20 LAB — POCT GLYCOSYLATED HEMOGLOBIN (HGB A1C): Hemoglobin A1C: 5.2 % (ref 4.0–5.6)

## 2022-06-20 MED ORDER — BUPROPION HCL ER (SR) 100 MG PO TB12: 100.0000 mg | ORAL_TABLET | Freq: Every morning | ORAL | 1 refills | Status: DC

## 2022-06-20 MED ORDER — OZEMPIC (0.25 OR 0.5 MG/DOSE) 2 MG/3ML ~~LOC~~ SOPN: 0.2500 mg | PEN_INJECTOR | Freq: Every day | SUBCUTANEOUS | 2 refills | Status: DC

## 2022-06-20 NOTE — Assessment & Plan Note (Signed)
chronic also hx of Depression & PTSD Taking Zoloft 200mg  qhs, Prazosin 2mg  qhs active in therapy having low labido & wt gain started Bupropion 100mg  SR qam last visit, but pt states she has not been able to get after first 30d due to pharmacy unable to fill? called pharmacy and they need a 90d supply resending , advised pt again on use & SE f/u prn

## 2022-06-20 NOTE — Progress Notes (Signed)
Patient ID: Dominique Davis, female    DOB: 12-15-1985, 37 y.o.   MRN: [id number]  Chief Complaint  Patient presents with   Gynecologic Exam   Obesity   Depression    HPI:      PAP smear testing:  last PAP 3 years ago. pt here for retesting. Denies any concerns, would like STI testing as well. Abnormal wt gain:  pt has been taking Saxenda 3mg  qd, ran out as her pharmacy did not have in stock, then heard from her ins that they are no longer covering as she gained a little weight while taking and did not lose the percentage needed to remain on medication. Pt asking about other options.    Anxiety/Depression: Patient complains of anxiety disorder.   She has the following symptoms: difficulty concentrating, fatigue, insomnia, irritable, racing thoughts, nightmares. Onset of symptoms was approximately  years ago, She denies current suicidal and homicidal ideation. Possible organic causes contributing are: none.  Risk factors: negative life event childhood trauma and previous episode of depression  She is currently taking Zoloft and Wellbutrin. She states the pharmacy was unable to fill her Wellbutrin & she has not had for 2 months. Pt reports having continued weight gain despite Wellbutrin, but has helped her lack of labido some.   Assessment & Plan:   Problem List Items Addressed This Visit       Endocrine   PCOS (polycystic ovarian syndrome)   Relevant Medications   Semaglutide,0.25 or 0.5MG /DOS, (OZEMPIC, 0.25 OR 0.5 MG/DOSE,) 2 MG/3ML SOPN     Other   GAD (generalized anxiety disorder)    chronic also hx of Depression & PTSD Taking Zoloft 200mg  qhs, Prazosin 2mg  qhs active in therapy having low labido & wt gain started Bupropion 100mg  SR qam last visit, but pt states she has not been able to get after first 30d due to pharmacy unable to fill? called pharmacy and they need a 90d supply resending , advised pt again on use & SE f/u prn      Relevant Medications   buPROPion ER  (WELLBUTRIN SR) 100 MG 12 hr tablet   Pre-diabetes   Relevant Orders   POCT HgB A1C (Completed)   Obesity (BMI 30-39.9)   Relevant Medications   Semaglutide,0.25 or 0.5MG /DOS, (OZEMPIC, 0.25 OR 0.5 MG/DOSE,) 2 MG/3ML SOPN   Abnormal weight gain    chronic hx of using Ozempic, but then could no longer get this, then Saxenda, but regained weight after reaching max dose and no longer covered referred to wt loss last visit continue to advise on wt loss strategies will try and resend Ozempic since several months have passed, new year check A1C f/u prn      Relevant Medications   Semaglutide,0.25 or 0.5MG /DOS, (OZEMPIC, 0.25 OR 0.5 MG/DOSE,) 2 MG/3ML SOPN   Other Visit Diagnoses     Encounter for Pap smear of cervix with HPV DNA cotesting    -  Primary   Relevant Orders   Cytology - PAP       Subjective:    Outpatient Medications Prior to Visit  Medication Sig Dispense Refill   diclofenac (VOLTAREN) 75 MG EC tablet Take 1 tablet (75 mg total) by mouth 2 (two) times daily as needed. 60 tablet 2   EPINEPHrine 0.3 mg/0.3 mL IJ SOAJ injection INJECT 0.3 ML (0.3 MG) INTO THE MUSCLE ONCE AS NEEDED AS DIRECTED 1 each 1   ferrous sulfate 324 MG TBEC Take 324 mg by mouth.  fluticasone (FLONASE) 50 MCG/ACT nasal spray Place 2 sprays into both nostrils daily. 16 g 6   metFORMIN (GLUCOPHAGE-XR) 500 MG 24 hr tablet TAKE 1 TABLET BY MOUTH TWICE  DAILY AFTER A MEAL.OK TO TAKE 2  TABLETS IN THE MORNING INSTEAD  OF TWICE DAILY IF DESIRED. 180 tablet 3   norethindrone-ethinyl estradiol (JUNEL 1/20) 1-20 MG-MCG tablet Take 1 tablet by mouth daily. as directed 63 tablet 3   Omega-3 Fatty Acids (FISH OIL PO) Take by mouth daily.     prazosin (MINIPRESS) 2 MG capsule TAKE 1 CAPSULE BY MOUTH AT  BEDTIME TO HELP WITH SLEEP AND  PREVENT NIGHTMARES 90 capsule 3   Probiotic Product (CULTURELLE PROBIOTICS PO) Take by mouth daily.     RESTASIS 0.05 % ophthalmic emulsion      sertraline (ZOLOFT) 100 MG  tablet TAKE 2 TABLETS BY MOUTH  DAILY 180 tablet 3   sodium chloride (OCEAN) 0.65 % SOLN nasal spray Place 1 spray into both nostrils as needed for congestion. 88 mL 0   buPROPion ER (WELLBUTRIN SR) 100 MG 12 hr tablet Take 1 tablet (100 mg total) by mouth in the morning. 30 tablet 3   No facility-administered medications prior to visit.   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 Exam conducted with a chaperone present.  Constitutional:      Appearance: Normal appearance.  Cardiovascular:     Rate and Rhythm: Normal rate and regular rhythm.  Pulmonary:     Effort: Pulmonary effort is normal.     Breath sounds: Normal breath sounds.  Genitourinary:    Exam position: Lithotomy position.     Pubic Area: No rash or pubic lice.      Labia:        Right: No rash or tenderness.        Left: No rash or tenderness.      Vagina: Normal.     Cervix: Normal.     Comments: PAP smear specimen obtained. Musculoskeletal:        General: Normal range of motion.  Skin:    General: Skin is warm and dry.  Neurological:     Mental Status: She is alert.  Psychiatric:        Mood and Affect: Mood normal.        Behavior: Behavior normal.    BP 128/84 (BP Location: Left Arm, Patient Position: Sitting, Cuff Size: Large)    Pulse 83   Temp 98.3 F (36.8 C) (Temporal)   Ht 5\' 7"  (1.702 m)   Wt 218 lb 6 oz (99.1 kg)   LMP 05/25/2022 (Exact Date)   SpO2 99%   BMI  34.20 kg/m  Wt Readings from Last 3 Encounters:  06/20/22 218 lb 6 oz (99.1 kg)  05/05/22 208 lb (94.3 kg)  04/04/22 208 lb 2 oz (94.4 kg)      Dulce Sellar, NP

## 2022-06-20 NOTE — Assessment & Plan Note (Signed)
chronic hx of using Ozempic, but then could no longer get this, then Saxenda, but regained weight after reaching max dose and no longer covered referred to wt loss last visit continue to advise on wt loss strategies will try and resend Ozempic since several months have passed, new year check A1C f/u prn

## 2022-06-21 NOTE — Telephone Encounter (Signed)
PA Approved on 06/21/2022.  I called pt and pt gave a verbalized understanding.

## 2022-06-21 NOTE — Telephone Encounter (Signed)
-----  Message from Jeanie Sewer, NP sent at 06/20/2022  5:38 PM EST ----- Regarding: PA for Ozempic Can you start a new PA for Ozempic since it has been months since last tried, wanting to see if will cover now. Can't use her prediabetes dx since A1C normal - they refused last time d/t normal reading. Use her obesity, abnormal wt gain & PCOS dx codes, thx

## 2022-06-22 LAB — CYTOLOGY - PAP
Adequacy: ABSENT
Chlamydia: NEGATIVE
Comment: NEGATIVE
Comment: NEGATIVE
Comment: NEGATIVE
Comment: NORMAL
Diagnosis: NEGATIVE
High risk HPV: NEGATIVE
Neisseria Gonorrhea: NEGATIVE
Trichomonas: NEGATIVE

## 2022-06-23 NOTE — Progress Notes (Signed)
PAP smear normal, HPV negative, and STD testing negative. Recheck PAP in 3-5 years.

## 2022-06-30 ENCOUNTER — Encounter (INDEPENDENT_AMBULATORY_CARE_PROVIDER_SITE_OTHER): Payer: Self-pay | Admitting: Family Medicine

## 2022-06-30 ENCOUNTER — Encounter: Payer: Self-pay | Admitting: Family

## 2022-06-30 ENCOUNTER — Ambulatory Visit (INDEPENDENT_AMBULATORY_CARE_PROVIDER_SITE_OTHER): Payer: 59 | Admitting: Family Medicine

## 2022-06-30 VITALS — BP 120/81 | HR 76 | Temp 98.6°F | Ht 67.0 in

## 2022-06-30 DIAGNOSIS — E669 Obesity, unspecified: Secondary | ICD-10-CM | POA: Diagnosis not present

## 2022-06-30 DIAGNOSIS — F319 Bipolar disorder, unspecified: Secondary | ICD-10-CM | POA: Diagnosis not present

## 2022-06-30 DIAGNOSIS — Z0289 Encounter for other administrative examinations: Secondary | ICD-10-CM

## 2022-06-30 DIAGNOSIS — Z6833 Body mass index (BMI) 33.0-33.9, adult: Secondary | ICD-10-CM | POA: Diagnosis not present

## 2022-06-30 DIAGNOSIS — E282 Polycystic ovarian syndrome: Secondary | ICD-10-CM

## 2022-06-30 HISTORY — DX: Bipolar disorder, unspecified: F31.9

## 2022-07-18 NOTE — Progress Notes (Signed)
Office: [phone]  /  Fax: [phone]  Initial Visit  Dominique Davis was seen in clinic today to evaluate for obesity. She is interested in losing weight to improve overall health and reduce the risk of weight related complications. She presents today to review program treatment options, initial physical assessment, and evaluation.     She was referred by: PCP  When asked what else they would they like to accomplish? She states: Lose a target amount of weight : 165  weight up and down since childhood  When asked how has your weight affected you? She states: Other:  Some associated conditions: PCOS and Other: fibromyalgia, bipolar disorder  Contributing factors: Family history, Stress, and Other: worked 3rd shift in the past; poor sleep; skips meals  Weight promoting medications identified: None  Current nutrition plan: None  Current level of physical activity: None  Current or previous pharmacotherapy: GLP-1, Metformin, Phentermine, and Other: lost from 240 to 190 lbs. Failed Saxenda. Did well with Ozempic  Response to medication: Lost weight initially but was unable to sustain weight loss   Past medical history includes:   Past Medical History:  Diagnosis Date   Abdominal pain 05/10/2019   Anxiety    Bloating 05/10/2019   BMI 29.0-29.9,adult 08/30/2011   Depression    Dysmenorrhea    Encounter for insertion of mirena IUD 03/01/2016   03/01/16    Loss of weight 11/21/2013   Migraines    Obesity    Small intestinal bacterial overgrowth AUG 2015   CIP/FLAG X7 DAYS(2X 2015)     Objective:   BP 120/81   Pulse 76   Temp 98.6 F (37 C)   Ht 5' 7"$  (1.702 m)   LMP 05/25/2022 (Exact Date)   SpO2 98%   BMI 34.20 kg/m  She was weighed on the bioimpedance scale: Body mass index is 34.2 kg/m.  Visceral Fat Rating: 8, Body Fat%: 41.4  General:  Alert, oriented and cooperative. Patient is in no acute distress.  Respiratory: Normal respiratory effort, no problems with  respiration noted  Extremities: Normal range of motion.    Mental Status: Normal mood and affect. Normal behavior. Normal judgment and thought content.   Assessment and Plan:  1. Bipolar affective disorder, remission status unspecified ([place]) Dominique Davis is in therapy and takes Wellbutrin SR 100 mg daily. Reports stable mood.  Plan: Continue current meds per PCP. Consider CBT with Dr. Mallie Mussel for  emotional eating.  2. PCOS (polycystic ovarian syndrome) On OCPs and Metformin XR 500 mg BID. Denies adverse side effects. Recently restarted Ozempic through her PCP.  Plan: Continue current meds. Obtain labs next visit to evaluate insulin resistance.  3. Obesity,current BMI 33.3 Continue Ozempic 0.25 mg weekly through PCP. Reviewed bioimpedance results.     Obesity Treatment Plan:  She will work on garnering support from family and friends to begin weight loss journey. Work on eliminating or reducing the presence of highly processed, calorie dense foods in the home. Complete provided nutritional and psychosocial assessment questionnaire prior to her next visit.  Assess for cardiometabolic complications and nutritional deficiencies via fasting serologies.  Assess REE via indirect calorimetry to guide the creation of a reduced calorie, high protein meal plan to promote loss of fat mass.   She will think about ideas on how to incorporate physical activity in their daily routine.  Dominique Davis will follow up in the next 1-2 weeks to review the above steps and continue evaluation and treatment.  Obesity Education  Performed Today:  She was weighed on the bioimpedance scale and results were discussed and documented in the synopsis.  We discussed obesity as a disease and the importance of a more detailed evaluation of all the factors contributing to the disease.  We discussed the importance of long term lifestyle changes which include nutrition, exercise and behavioral modifications as  well as the importance of customizing this to her specific health and social needs.  We discussed the benefits of reaching a healthier weight to alleviate the symptoms of existing conditions and reduce the risks of the biomechanical, metabolic and psychological effects of obesity.  Dominique Davis appears to be in the action stage of change and states they are ready to start intensive lifestyle modifications and behavioral modifications.  30 minutes was spent today on this visit including the above counseling, pre-visit chart review, and post-visit documentation.  I, Kathlene November, BS, CMA, am acting as transcriptionist for Loyal Gambler, DO.   I have reviewed the above documentation for accuracy and completeness, and I agree with the above. Dell Ponto, DO

## 2022-08-03 ENCOUNTER — Ambulatory Visit (INDEPENDENT_AMBULATORY_CARE_PROVIDER_SITE_OTHER): Payer: 59 | Admitting: Family Medicine

## 2022-08-03 ENCOUNTER — Encounter (INDEPENDENT_AMBULATORY_CARE_PROVIDER_SITE_OTHER): Payer: Self-pay | Admitting: Family Medicine

## 2022-08-03 VITALS — BP 114/73 | HR 88 | Temp 98.7°F | Ht 67.0 in | Wt 206.0 lb

## 2022-08-03 DIAGNOSIS — F39 Unspecified mood [affective] disorder: Secondary | ICD-10-CM

## 2022-08-03 DIAGNOSIS — R0602 Shortness of breath: Secondary | ICD-10-CM | POA: Diagnosis not present

## 2022-08-03 DIAGNOSIS — E669 Obesity, unspecified: Secondary | ICD-10-CM | POA: Diagnosis not present

## 2022-08-03 DIAGNOSIS — Z6832 Body mass index (BMI) 32.0-32.9, adult: Secondary | ICD-10-CM

## 2022-08-03 DIAGNOSIS — Z1331 Encounter for screening for depression: Secondary | ICD-10-CM

## 2022-08-03 DIAGNOSIS — E282 Polycystic ovarian syndrome: Secondary | ICD-10-CM | POA: Diagnosis not present

## 2022-08-03 DIAGNOSIS — R5383 Other fatigue: Secondary | ICD-10-CM

## 2022-08-03 DIAGNOSIS — F5089 Other specified eating disorder: Secondary | ICD-10-CM

## 2022-08-03 DIAGNOSIS — F509 Eating disorder, unspecified: Secondary | ICD-10-CM | POA: Insufficient documentation

## 2022-08-03 HISTORY — DX: Encounter for screening for depression: Z13.31

## 2022-08-03 HISTORY — DX: Shortness of breath: R06.02

## 2022-08-03 HISTORY — DX: Other fatigue: R53.83

## 2022-08-04 ENCOUNTER — Encounter: Payer: Self-pay | Admitting: Radiology

## 2022-08-04 ENCOUNTER — Ambulatory Visit: Payer: 59 | Admitting: Family

## 2022-08-04 LAB — COMPREHENSIVE METABOLIC PANEL
ALT: 9 IU/L (ref 0–32)
AST: 13 IU/L (ref 0–40)
Albumin/Globulin Ratio: 1.7 (ref 1.2–2.2)
Albumin: 4.4 g/dL (ref 3.9–4.9)
Alkaline Phosphatase: 32 IU/L — ABNORMAL LOW (ref 44–121)
BUN/Creatinine Ratio: 14 (ref 9–23)
BUN: 13 mg/dL (ref 6–20)
Bilirubin Total: 0.4 mg/dL (ref 0.0–1.2)
CO2: 21 mmol/L (ref 20–29)
Calcium: 9 mg/dL (ref 8.7–10.2)
Chloride: 102 mmol/L (ref 96–106)
Creatinine, Ser: 0.92 mg/dL (ref 0.57–1.00)
Globulin, Total: 2.6 g/dL (ref 1.5–4.5)
Glucose: 78 mg/dL (ref 70–99)
Potassium: 4.1 mmol/L (ref 3.5–5.2)
Sodium: 140 mmol/L (ref 134–144)
Total Protein: 7 g/dL (ref 6.0–8.5)
eGFR: 83 mL/min/{1.73_m2} (ref >59)

## 2022-08-04 LAB — CBC WITH DIFFERENTIAL/PLATELET
Basophils Absolute: 0 10*3/uL (ref 0.0–0.2)
Basos: 0 %
EOS (ABSOLUTE): 0.1 10*3/uL (ref 0.0–0.4)
Eos: 1 %
Hematocrit: 42 % (ref 34.0–46.6)
Hemoglobin: 13.7 g/dL (ref 11.1–15.9)
Immature Grans (Abs): 0 10*3/uL (ref 0.0–0.1)
Immature Granulocytes: 0 %
Lymphocytes Absolute: 1.8 10*3/uL (ref 0.7–3.1)
Lymphs: 28 %
MCH: 27.8 pg (ref 26.6–33.0)
MCHC: 32.6 g/dL (ref 31.5–35.7)
MCV: 85 fL (ref 79–97)
Monocytes Absolute: 0.4 10*3/uL (ref 0.1–0.9)
Monocytes: 7 %
Neutrophils Absolute: 4 10*3/uL (ref 1.4–7.0)
Neutrophils: 64 %
Platelets: 246 10*3/uL (ref 150–450)
RBC: 4.92 x10E6/uL (ref 3.77–5.28)
RDW: 13 % (ref 11.7–15.4)
WBC: 6.3 10*3/uL (ref 3.4–10.8)

## 2022-08-04 LAB — LIPID PANEL
Chol/HDL Ratio: 3.2 ratio (ref 0.0–4.4)
Cholesterol, Total: 209 mg/dL — ABNORMAL HIGH (ref 100–199)
HDL: 66 mg/dL (ref >39)
LDL Chol Calc (NIH): 125 mg/dL — ABNORMAL HIGH (ref 0–99)
Triglycerides: 102 mg/dL (ref 0–149)
VLDL Cholesterol Cal: 18 mg/dL (ref 5–40)

## 2022-08-04 LAB — VITAMIN B12: Vitamin B-12: 343 pg/mL (ref 232–1245)

## 2022-08-04 LAB — T4, FREE: Free T4: 0.98 ng/dL (ref 0.82–1.77)

## 2022-08-04 LAB — VITAMIN D 25 HYDROXY (VIT D DEFICIENCY, FRACTURES): Vit D, 25-Hydroxy: 30.1 ng/mL (ref 30.0–100.0)

## 2022-08-04 LAB — TSH: TSH: 2.01 u[IU]/mL (ref 0.450–4.500)

## 2022-08-04 LAB — FOLATE: Folate: 6.9 ng/mL (ref >3.0)

## 2022-08-04 LAB — INSULIN, RANDOM: INSULIN: 5.2 u[IU]/mL (ref 2.6–24.9)

## 2022-08-08 ENCOUNTER — Encounter: Payer: Self-pay | Admitting: Family

## 2022-08-08 ENCOUNTER — Ambulatory Visit: Payer: 59 | Admitting: Family

## 2022-08-08 VITALS — BP 106/72 | HR 79 | Temp 98.2°F | Ht 67.0 in | Wt 210.8 lb

## 2022-08-08 DIAGNOSIS — E669 Obesity, unspecified: Secondary | ICD-10-CM

## 2022-08-08 DIAGNOSIS — R635 Abnormal weight gain: Secondary | ICD-10-CM

## 2022-08-08 MED ORDER — OZEMPIC (0.25 OR 0.5 MG/DOSE) 2 MG/3ML ~~LOC~~ SOPN: 0.2500 mg | PEN_INJECTOR | Freq: Every day | SUBCUTANEOUS | 2 refills | Status: DC

## 2022-08-08 NOTE — Assessment & Plan Note (Signed)
chronic started Ozempic after last visit, tolerating well, decreased appetite, up to 0.'5mg'$  qweek seen by wt loss clinic and started on new eating plan, had been not eating enough calories -1 meal/day advised by clinic may need to cut dose back to 0.25 to have enough appetite to eat thru the day f/u 2 months

## 2022-08-08 NOTE — Progress Notes (Signed)
Patient ID: Dominique Davis, female    DOB: Mar 12, 1986, 37 y.o.   MRN: [id number]  Chief Complaint  Patient presents with   Follow-up    Pt here to be seen due to her starting ozempic again   ozempic    HPI: Abnormal wt gain:  pt has been taking Saxenda '3mg'$  qd, ran out as her pharmacy did not have in stock, then heard from her ins that they are no longer covering as she gained a little weight while taking and did not lose the percentage needed to remain on medication. Started last visit on Ozempic.   Assessment & Plan:  Abnormal weight gain Assessment & Plan: chronic started Ozempic after last visit, tolerating well, decreased appetite, up to 0.'5mg'$  qweek seen by wt loss clinic and started on new eating plan, had been not eating enough calories -1 meal/day advised by clinic may need to cut dose back to 0.25 to have enough appetite to eat thru the day f/u 2 months  Orders: -     Ozempic (0.25 or 0.5 MG/DOSE); Inject 0.25-0.5 mg into the skin daily at 12 noon.  Dispense: 3 mL; Refill: 2  Obesity (BMI 30-39.9) -     Ozempic (0.25 or 0.5 MG/DOSE); Inject 0.25-0.5 mg into the skin daily at 12 noon.  Dispense: 3 mL; Refill: 2    Subjective:    Outpatient Medications Prior to Visit  Medication Sig Dispense Refill   buPROPion ER (WELLBUTRIN SR) 100 MG 12 hr tablet Take 1 tablet (100 mg total) by mouth in the morning. 90 tablet 1   diclofenac (VOLTAREN) 75 MG EC tablet Take 1 tablet (75 mg total) by mouth 2 (two) times daily as needed. 60 tablet 2   EPINEPHrine 0.3 mg/0.3 mL IJ SOAJ injection INJECT 0.3 ML (0.3 MG) INTO THE MUSCLE ONCE AS NEEDED AS DIRECTED 1 each 1   ferrous sulfate 324 MG TBEC Take 324 mg by mouth.     fluticasone (FLONASE) 50 MCG/ACT nasal spray Place 2 sprays into both nostrils daily. 16 g 6   metFORMIN (GLUCOPHAGE-XR) 500 MG 24 hr tablet TAKE 1 TABLET BY MOUTH TWICE  DAILY AFTER A MEAL.OK TO TAKE 2  TABLETS IN THE MORNING INSTEAD  OF TWICE DAILY IF DESIRED. 180  tablet 3   norethindrone-ethinyl estradiol (JUNEL 1/20) 1-20 MG-MCG tablet Take 1 tablet by mouth daily. as directed 63 tablet 3   Omega-3 Fatty Acids (FISH OIL PO) Take by mouth daily.     prazosin (MINIPRESS) 2 MG capsule TAKE 1 CAPSULE BY MOUTH AT  BEDTIME TO HELP WITH SLEEP AND  PREVENT NIGHTMARES 90 capsule 3   Probiotic Product (CULTURELLE PROBIOTICS PO) Take by mouth daily.     RESTASIS 0.05 % ophthalmic emulsion      sertraline (ZOLOFT) 100 MG tablet TAKE 2 TABLETS BY MOUTH  DAILY 180 tablet 3   sodium chloride (OCEAN) 0.65 % SOLN nasal spray Place 1 spray into both nostrils as needed for congestion. 88 mL 0   Semaglutide,0.25 or 0.'5MG'$ /DOS, (OZEMPIC, 0.25 OR 0.5 MG/DOSE,) 2 MG/3ML SOPN Inject 0.25 mg into the skin daily at 12 noon. 3 mL 2   No facility-administered medications prior to visit.   Past Medical History:  Diagnosis Date   Abdominal pain 05/10/2019   Anxiety    Back pain    Bloating 05/10/2019   BMI 29.0-29.9,adult 08/30/2011   Depression    Dysmenorrhea    Edema    Encounter for insertion of  mirena IUD 03/01/2016   03/01/16    High cholesterol    Joint pain    Loss of weight 11/21/2013   Migraines    Obesity    Pre-diabetes    Small intestinal bacterial overgrowth 01/2014   CIP/FLAG X7 DAYS(2X 2015)   Past Surgical History:  Procedure Laterality Date   BACTERIAL OVERGROWTH TEST N/A 01/03/2014   CANCELLED   BACTERIAL OVERGROWTH TEST N/A 01/10/2014   0 PPM AT START, 65 PPM(75 MINS), 58 PPM(105 MINS)   WISDOM TOOTH EXTRACTION     x2   Allergies  Allergen Reactions   Black Pepper [[place]] Rash      Objective:    Physical Exam Vitals and nursing note reviewed.  Constitutional:      Appearance: Normal appearance. She is obese.  Cardiovascular:     Rate and Rhythm: Normal rate and regular rhythm.  Pulmonary:     Effort: Pulmonary effort is normal.     Breath sounds: Normal breath sounds.  Musculoskeletal:        General: Normal range of motion.   Skin:    General: Skin is warm and dry.  Neurological:     Mental Status: She is alert.  Psychiatric:        Mood and Affect: Mood normal.        Behavior: Behavior normal.    BP 106/72   Pulse 79   Temp 98.2 F (36.8 C) (Temporal)   Ht '5\' 7"'$  (1.702 m)   Wt 210 lb 12.8 oz (95.6 kg)   LMP 08/04/2022 (Approximate)   SpO2 98%   BMI 33.02 kg/m  Wt Readings from Last 3 Encounters:  08/08/22 210 lb 12.8 oz (95.6 kg)  08/03/22 206 lb (93.4 kg)  06/20/22 218 lb 6 oz (99.1 kg)      Jeanie Sewer, NP

## 2022-08-12 ENCOUNTER — Other Ambulatory Visit: Payer: Self-pay | Admitting: Family

## 2022-08-12 DIAGNOSIS — E669 Obesity, unspecified: Secondary | ICD-10-CM

## 2022-08-12 DIAGNOSIS — R635 Abnormal weight gain: Secondary | ICD-10-CM

## 2022-08-15 NOTE — Progress Notes (Unsigned)
Chief Complaint:   Dominique Davis (MR# [id number]) is a 37 y.o. female who presents for evaluation and treatment of obesity and related comorbidities. Current BMI is Body mass index is 32.26 kg/m. Dominique Davis has been struggling with her weight for many years and has been unsuccessful in either losing weight, maintaining weight loss, or reaching her healthy weight goal.  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  Lives with her boyfriend, no children.  She is taking Ozempic 0.25 mg weekly, metformin 1,000 mg daily.  Disordered eating from childhood.  She gets 10,000 steps 5 times per week, bought a walking pad, she used to like to lift weights.   Dominique Davis is currently in the action stage of change and ready to dedicate time achieving and maintaining a healthier weight. Dominique Davis is interested in becoming our patient and working on intensive lifestyle modifications including (but not limited to) diet and exercise for weight loss.  Dominique Davis's habits were reviewed today and are as follows: Her family eats meals together, she thinks her family will eat healthier with her, her desired weight loss is 41 lbs, she has been heavy most of her life, she started gaining weight in [employer], her heaviest weight ever was 250 pounds, she has significant food cravings issues, she skips meals frequently, she is frequently drinking liquids with calories, she frequently eats larger portions than normal, she has binge eating behaviors, and she struggles with emotional eating.  Depression Screen Dominique Davis's Food and Mood (modified PHQ-9) score was 15.  Subjective:   1. Other fatigue Dominique Davis admits to daytime somnolence and admits to waking up still tired. Patient has a history of symptoms of daytime fatigue, morning fatigue, and morning headache. Dominique Davis generally gets  4-6  hours of sleep per night, and states that she has nightime awakenings. Snoring is present.  Apneic episodes are present. Epworth Sleepiness Score is 9. EKG NSR.  2. SOBOE (shortness of breath on exertion) Dominique Davis notes increasing shortness of breath with exercising and seems to be worsening over time with weight gain. She notes getting out of breath sooner with activity than she used to. This has not gotten worse recently. Dominique Davis denies shortness of breath at rest or orthopnea.  3. PCOS (polycystic ovarian syndrome) Patient is on metformin XR 500 mg 1 tab twice daily.  Patient denies GI upset.  4. Other disorder of eating Long history of emotional eating, restrictive food behaviors and binge eating.  Patient is currently in therapy with a good support system.  5. Mood disorder ([place]) Patient is taking bupropion ER 100 mg daily, sertraline 200 mg daily.  Bariatric PHQ-9:15.  Mood is stable.  Boyfriend is supportive.  Assessment/Plan:   1. Other fatigue Dominique Davis does feel that her weight is causing her energy to be lower than it should be. Fatigue may be related to obesity, depression or many other causes. Labs will be ordered, and in the meanwhile, Dominique Davis will focus on self care including making healthy food choices, increasing physical activity and focusing on stress reduction.  Update labs today.  - EKG 12-Lead - VITAMIN D 25 Hydroxy (Vit-D Deficiency, Fractures) - TSH - T4, free - Lipid panel - Insulin, random - Folate - Comprehensive metabolic panel - Vitamin 123456 - CBC with Differential/Platelet  2. SOBOE (shortness of breath on exertion) Dominique Davis does feel that she gets out of breath more easily that she used to when she exercises. Dominique Davis's shortness of  breath appears to be obesity related and exercise induced. She has agreed to work on weight loss and gradually increase exercise to treat her exercise induced shortness of breath. Will continue to monitor closely.  3. PCOS (polycystic ovarian syndrome) Begin prescribed dietary plan.  Continue metformin per  PCP.  4. Other disorder of eating Begin prescribed meal plan, eating on a schedule with protein and fiber at all meals.  Keep junk food triggers out of the house.  5. Mood disorder ([place]) Consider adding CBT with Dr. Mallie Mussel, but she is in therapy currently.  6. Depression screen Dominique Davis had a positive depression screening. Depression is commonly associated with obesity and often results in emotional eating behaviors. We will monitor this closely and work on CBT to help improve the non-hunger eating patterns. Referral to Psychology may be required if no improvement is seen as she continues in our clinic.  7. Generalized obesity  8. Obesity,current BMI 32.3 Recently increased Ozempic to 0.5 mg weekly with improved satiety, continue per PCP without history of T2DM.   Dominique Davis is currently in the action stage of change and her goal is to continue with weight loss efforts. I recommend Dominique Davis begin the structured treatment plan as follows:  She has agreed to the Category 3 Plan.  Exercise goals:  10,000 steps per day.     Behavioral modification strategies: increasing lean protein intake, increasing water intake, decreasing liquid calories, decreasing eating out, meal planning and cooking strategies, keeping healthy foods in the home, better snacking choices, planning for success, and decreasing junk food.  She was informed of the importance of frequent follow-up visits to maximize her success with intensive lifestyle modifications for her multiple health conditions. She was informed we would discuss her lab results at her next visit unless there is a critical issue that needs to be addressed sooner. Dominique Davis agreed to keep her next visit at the agreed upon time to discuss these results.  Objective:   Blood pressure 114/73, pulse 88, temperature 98.7 F (37.1 C), height '5\' 7"'$  (1.702 m), weight 206 lb (93.4 kg), SpO2 97 %. Body mass index is 32.26 kg/m.  EKG: Normal sinus rhythm, rate 92  bpm.  Indirect Calorimeter completed today shows a VO2 of 287 and a REE of 1987.  Her calculated basal metabolic rate is [id number] thus her basal metabolic rate is better than expected.  General: Cooperative, alert, well developed, in no acute distress. HEENT: Conjunctivae and lids unremarkable. Cardiovascular: Regular rhythm.  Lungs: Normal work of breathing. Neurologic: No focal deficits.   Lab Results  Component Value Date   CREATININE 0.92 08/03/2022   BUN 13 08/03/2022   NA 140 08/03/2022   K 4.1 08/03/2022   CL 102 08/03/2022   CO2 21 08/03/2022   Lab Results  Component Value Date   ALT 9 08/03/2022   AST 13 08/03/2022   ALKPHOS 32 (L) 08/03/2022   BILITOT 0.4 08/03/2022   Lab Results  Component Value Date   HGBA1C 5.2 06/20/2022   HGBA1C 5.3 03/29/2022   HGBA1C 5.0 09/01/2020   HGBA1C 5.3 12/23/2015   HGBA1C 5.3 03/11/2013   Lab Results  Component Value Date   INSULIN 5.2 08/03/2022   Lab Results  Component Value Date   TSH 2.010 08/03/2022   Lab Results  Component Value Date   CHOL 209 (H) 08/03/2022   HDL 66 08/03/2022   LDLCALC 125 (H) 08/03/2022   TRIG 102 08/03/2022   CHOLHDL 3.2 08/03/2022   Lab Results  Component Value Date   WBC 6.3 08/03/2022   HGB 13.7 08/03/2022   HCT 42.0 08/03/2022   MCV 85 08/03/2022   PLT 246 08/03/2022   No results found for: "IRON", "TIBC", "FERRITIN"  Attestation Statements:   Reviewed by clinician on day of visit: allergies, medications, problem list, medical history, surgical history, family history, social history, and previous encounter notes.  Time spent on visit including pre-visit chart review and post-visit charting and care was 40 minutes.   I, Davy Pique, am acting as Location manager for Loyal Gambler, DO.

## 2022-08-17 ENCOUNTER — Encounter: Payer: Self-pay | Admitting: Family

## 2022-08-17 ENCOUNTER — Other Ambulatory Visit: Payer: Self-pay

## 2022-08-17 ENCOUNTER — Encounter (INDEPENDENT_AMBULATORY_CARE_PROVIDER_SITE_OTHER): Payer: Self-pay | Admitting: Family Medicine

## 2022-08-17 ENCOUNTER — Ambulatory Visit (INDEPENDENT_AMBULATORY_CARE_PROVIDER_SITE_OTHER): Payer: 59 | Admitting: Family Medicine

## 2022-08-17 VITALS — BP 108/76 | HR 78 | Temp 98.7°F | Ht 67.0 in | Wt 203.0 lb

## 2022-08-17 DIAGNOSIS — E282 Polycystic ovarian syndrome: Secondary | ICD-10-CM | POA: Diagnosis not present

## 2022-08-17 DIAGNOSIS — F5089 Other specified eating disorder: Secondary | ICD-10-CM

## 2022-08-17 DIAGNOSIS — M79604 Pain in right leg: Secondary | ICD-10-CM

## 2022-08-17 DIAGNOSIS — E669 Obesity, unspecified: Secondary | ICD-10-CM

## 2022-08-17 DIAGNOSIS — R7303 Prediabetes: Secondary | ICD-10-CM | POA: Diagnosis not present

## 2022-08-17 DIAGNOSIS — F411 Generalized anxiety disorder: Secondary | ICD-10-CM

## 2022-08-17 DIAGNOSIS — Z6831 Body mass index (BMI) 31.0-31.9, adult: Secondary | ICD-10-CM

## 2022-08-17 MED ORDER — VITAMIN D (ERGOCALCIFEROL) 1.25 MG (50000 UNIT) PO CAPS: 50000.0000 [IU] | ORAL_CAPSULE | ORAL | 0 refills | Status: DC

## 2022-08-17 MED ORDER — BUPROPION HCL ER (SR) 100 MG PO TB12: 100.0000 mg | ORAL_TABLET | Freq: Every morning | ORAL | 1 refills | Status: DC

## 2022-08-17 NOTE — Assessment & Plan Note (Addendum)
Right anterior thigh pain x 2 mos after falling off the porch and fractured her L foot Uses heating pad with some relief.  Rarely taking NSAIDs. Hurts with sitting to standing position change and with external rotation Thigh does not hurt while walking but does hurt once getting off the treadmill  Plan to stretch daily, use foam roller, activity as tolerated.  If pain does worsen, will refer to sports medicine.

## 2022-08-17 NOTE — Assessment & Plan Note (Signed)
Lab Results  Component Value Date   HGBA1C 5.2 06/20/2022   Most recent A1c in January was normal at 5.2.  Her PCP has her on Ozempic 0.25 mg once weekly injection for PCOS.  She is doing well with a low sugar diet and her prescribed meal plan, adding in walking 30 minutes 3 times a week.

## 2022-08-17 NOTE — Progress Notes (Signed)
Office: [phone]  /  Fax: [place]  Starting Date: 08/03/22  Starting Weight: 206lb   Weight Lost Since Last Visit: 3lb   Vitals Temp: 98.7 F (37.1 C) BP: 108/76 Pulse Rate: 78 SpO2: 98 %   Body Composition  Body Fat %: 39 % Fat Mass (lbs): 79.4 lbs Muscle Mass (lbs): 118 lbs Total Body Water (lbs): 84.2 lbs Visceral Fat Rating : 7   HPI  Chief Complaint: OBESITY  Dominique Davis is here to discuss her progress with her obesity treatment plan. She is on the the Category 3 Plan and states she is following her eating plan approximately 90 % of the time. She states she is exercising 30 minutes 3 times per week.  Interval History:  Since last office visit she is down 3 lb She is getting most of the foods on her plan She reduced her Ozempic to 0.25 mg [place] weekly Starting to meal prep  She feels better with her energy level Walking dogs more and started using treadmill  Pharmacotherapy: on Ozempic for prediabetes  PHYSICAL EXAM:  Blood pressure 108/76, pulse 78, temperature 98.7 F (37.1 C), height '5\' 7"'$  (1.702 m), weight 203 lb (92.1 kg), last menstrual period 08/04/2022, SpO2 98 %. Body mass index is 31.79 kg/m.  General: She is overweight, cooperative, alert, well developed, and in no acute distress. PSYCH: Has normal mood, affect and thought process.   Lungs: Normal breathing effort, no conversational dyspnea. Gait normal   ASSESSMENT AND PLAN  TREATMENT PLAN FOR OBESITY:  Recommended Dietary Goals  Dominique Davis is currently in the action stage of change. As such, her goal is to continue weight management plan. She has agreed to the Category 3 Plan.  Behavioral Intervention  We discussed the following Behavioral Modification Strategies today: increasing lean protein intake, increasing vegetables, avoiding skipping meals, increasing water intake, work on meal planning and easy cooking plans, and reading food labels  .  Additional resources provided today: NA  Recommended Physical Activity Goals  Dominique Davis has been advised to work up to 150 minutes of moderate intensity aerobic activity a week and strengthening exercises 2-3 times per week for cardiovascular health, weight loss maintenance and preservation of muscle mass.   She has agreed to continue physical activity as is.   Pharmacotherapy changes for the treatment of obesity:   ASSOCIATED CONDITIONS ADDRESSED TODAY  PCOS (polycystic ovarian syndrome) Assessment & Plan: Reviewed labs including a normal fasting insulin Currently on metformin XR 500 mg once daily and Ozempic 0.25 mg once weekly injection per PCP Doing well reducing intake of added sugar and refined carbohydrates  Continue prescribed meal plan   Generalized obesity  BMI 31.0-31.9,adult  Other disorder of eating Assessment & Plan: She denies having current issues with emotional eating, restrictive eating habits or binge eating.  She has a good support system and has been improving her relationship with food.  We discussed adding in cognitive behavioral therapy with Dr. Mallie Mussel.  She declined at this point in time.  Continue to work on eating on a schedule getting in lean protein and fiber with each meal and snack   Pre-diabetes Assessment & Plan: Lab Results  Component Value Date   HGBA1C 5.2 06/20/2022   Most recent A1c in January was normal at 5.2.  Her PCP has her on Ozempic 0.25 mg once weekly injection for PCOS.  She is doing well with a low sugar diet and her prescribed meal plan, adding in walking 30  minutes 3 times a week.   Right leg pain Assessment & Plan: Right anterior thigh pain x 2 mos after falling off the porch and fractured her L foot Uses heating pad with some relief.  Rarely taking NSAIDs. Hurts with sitting to standing position change and with external rotation Thigh does not hurt while walking but does hurt once getting off the treadmill  Plan  to stretch daily, use foam roller, activity as tolerated.  If pain does worsen, will refer to sports medicine.     Other orders -     Vitamin D (Ergocalciferol); Take 1 capsule (50,000 Units total) by mouth every 7 (seven) days.  Dispense: 5 capsule; Refill: 0      She was informed of the importance of frequent follow up visits to maximize her success with intensive lifestyle modifications for her multiple health conditions.   ATTESTASTION STATEMENTS:  Reviewed by clinician on day of visit: allergies, medications, problem list, medical history, surgical history, family history, social history, and previous encounter notes pertinent to obesity diagnosis.   I have personally spent 30 minutes total time today in preparation, patient care, nutritional counseling and documentation for this visit, including the following: review of clinical lab tests; review of medical tests/procedures/services.      Dell Ponto, DO DABFM, DABOM [facility] [address] [place], [place] [address] [phone]

## 2022-08-17 NOTE — Assessment & Plan Note (Signed)
Reviewed labs including a normal fasting insulin Currently on metformin XR 500 mg once daily and Ozempic 0.25 mg once weekly injection per PCP Doing well reducing intake of added sugar and refined carbohydrates  Continue prescribed meal plan

## 2022-08-17 NOTE — Assessment & Plan Note (Signed)
She denies having current issues with emotional eating, restrictive eating habits or binge eating.  She has a good support system and has been improving her relationship with food.  We discussed adding in cognitive behavioral therapy with Dr. Mallie Mussel.  She declined at this point in time.  Continue to work on eating on a schedule getting in lean protein and fiber with each meal and snack

## 2022-08-21 ENCOUNTER — Encounter (INDEPENDENT_AMBULATORY_CARE_PROVIDER_SITE_OTHER): Payer: Self-pay

## 2022-09-08 ENCOUNTER — Encounter: Payer: Self-pay | Admitting: Family

## 2022-09-09 NOTE — Telephone Encounter (Signed)
I called pt and left a voicemail in regards to [facility] paper work.

## 2022-09-14 NOTE — Telephone Encounter (Signed)
Patient requests to be called with status of FMLA forms

## 2022-09-16 NOTE — Telephone Encounter (Signed)
I called and spoke with pt to go over FMLA questions. Pt states FMLA Is due by 09/26/2022.  Form placed on providers desk.

## 2022-09-19 ENCOUNTER — Ambulatory Visit (INDEPENDENT_AMBULATORY_CARE_PROVIDER_SITE_OTHER): Payer: 59 | Admitting: Family Medicine

## 2022-09-19 ENCOUNTER — Encounter (INDEPENDENT_AMBULATORY_CARE_PROVIDER_SITE_OTHER): Payer: Self-pay | Admitting: Family Medicine

## 2022-09-19 VITALS — BP 110/77 | HR 85 | Temp 98.6°F | Ht 67.0 in | Wt 201.0 lb

## 2022-09-19 DIAGNOSIS — F5089 Other specified eating disorder: Secondary | ICD-10-CM | POA: Diagnosis not present

## 2022-09-19 DIAGNOSIS — Z6831 Body mass index (BMI) 31.0-31.9, adult: Secondary | ICD-10-CM

## 2022-09-19 DIAGNOSIS — K5901 Slow transit constipation: Secondary | ICD-10-CM

## 2022-09-19 DIAGNOSIS — E282 Polycystic ovarian syndrome: Secondary | ICD-10-CM

## 2022-09-19 DIAGNOSIS — E668 Other obesity: Secondary | ICD-10-CM | POA: Diagnosis not present

## 2022-09-19 DIAGNOSIS — E669 Obesity, unspecified: Secondary | ICD-10-CM

## 2022-09-19 MED ORDER — VITAMIN D (ERGOCALCIFEROL) 1.25 MG (50000 UNIT) PO CAPS: 50000.0000 [IU] | ORAL_CAPSULE | ORAL | 0 refills | Status: DC

## 2022-09-19 NOTE — Assessment & Plan Note (Signed)
She has reduced her metformin XR to 500 mg ONCE daily.  This is prescribed by her PCP for PCOS along with off label use of Ozempic 0.25 mg weekly.  She is tolerating both medications well.  She has reduced her intake of sugar and starches and added in more consistent walking.  Her menstrual cycle came after a long period of amenorrhea.  Continue current medications along with an active plan for weight reduction.

## 2022-09-19 NOTE — Assessment & Plan Note (Signed)
She is doing a better job of eating on a schedule and not skipping meals.  She is focused more on protein and fiber with meals using her prescribed meal plan as an outline.  She has declined CBT with Dr Dewaine Conger and has a good support system at home though stress levels remain high at work.  Continue to work on stress reduction, mindful eating and eating on a schedule.

## 2022-09-19 NOTE — Progress Notes (Signed)
Office: [phone]  /  Fax: [phone]  WEIGHT SUMMARY AND BIOMETRICS  Starting Date: 08/03/22  Starting Weight: 206lb   Weight Lost Since Last Visit: 2lb   Vitals Temp: 98.6 F (37 C) BP: 110/77 Pulse Rate: 85 SpO2: 97 %   Body Composition  Body Fat %: 39.8 % Fat Mass (lbs): 80 lbs Muscle Mass (lbs): 114.8 lbs Total Body Water (lbs): 88.8 lbs Visceral Fat Rating : 8     HPI  Chief Complaint: OBESITY  Dominique Davis is here to discuss her progress with her obesity treatment plan. She is on the the Category 3 Plan and states she is following her eating plan approximately 85 % of the time. She states she is exercising 30-60 minutes 7 times per week.   Interval History:  Since last office visit she is is down 2 lb Net weight loss 3 lb in 6 wks She is doing better with eating on a schedule She has been more constipated lately She has some work related stress   Plant based protein/ meat substitute handout given  Pharmacotherapy: none  PHYSICAL EXAM:  Blood pressure 110/77, pulse 85, temperature 98.6 F (37 C), height  (1.702 m), weight 201 lb (91.2 kg), SpO2 97 %. Body mass index is 31.48 kg/m.  General: She is overweight, cooperative, alert, well developed, and in no acute distress. PSYCH: Has normal mood, affect and thought process.   Lungs: Normal breathing effort, no conversational dyspnea.   ASSESSMENT AND PLAN  TREATMENT PLAN FOR OBESITY:  Recommended Dietary Goals  Dominique Davis is currently in the action stage of change. As such, her goal is to continue weight management plan. She has agreed to the Category 3 Plan.  Behavioral Intervention  We discussed the following Behavioral Modification Strategies today: increasing lean protein intake, increasing vegetables, increasing fiber rich foods, avoiding skipping meals, increasing water intake, work on meal planning and preparation, decreasing eating out or consumption of processed foods, and  making healthy choices when eating convenient foods, work on managing stress, creating time for self-care and relaxation measures, and continue to work on implementation of reduced calorie nutritional plan.  Additional resources provided today: NA  Recommended Physical Activity Goals  Dominique Davis has been advised to work up to 150 minutes of moderate intensity aerobic activity a week and strengthening exercises 2-3 times per week for cardiovascular health, weight loss maintenance and preservation of muscle mass.   She has agreed to Work on scheduling and tracking physical activity.  Walking the dogs more; workout challenge with friends  Pharmacotherapy changes for the treatment of obesity: on Ozempic 0.25 mg weekly  ASSOCIATED CONDITIONS ADDRESSED TODAY  PCOS (polycystic ovarian syndrome) Assessment & Plan: She has reduced her metformin XR to 500 mg ONCE daily.  This is prescribed by her PCP for PCOS along with off label use of Ozempic 0.25 mg weekly.  She is tolerating both medications well.  She has reduced her intake of sugar and starches and added in more consistent walking.  Her menstrual cycle came after a long period of amenorrhea.  Continue current medications along with an active plan for weight reduction.   Generalized obesity Assessment & Plan: Overall, she has lost 3 lb in 6 weeks, losing some skeletal muscle mass following a fall She is back to regular exercise and has done a better job getting in foods We reviewed some gluten free items and meat alternatives handout given Also reviewed some plant based protein items  Continue cat 3 meal plan  with slight variatrions   BMI 31.0-31.9,adult  Other disorder of eating Assessment & Plan: She is doing a better job of eating on a schedule and not skipping meals.  She is focused more on protein and fiber with meals using her prescribed meal plan as an outline.  She has declined CBT with Dr Dewaine Conger and has a good support system at  home though stress levels remain high at work.  Continue to work on stress reduction, mindful eating and eating on a schedule.   Other orders -     Vitamin D (Ergocalciferol); Take 1 capsule (50,000 Units total) by mouth every 7 (seven) days.  Dispense: 5 capsule; Refill: 0      She was informed of the importance of frequent follow up visits to maximize her success with intensive lifestyle modifications for her multiple health conditions.   ATTESTASTION STATEMENTS:  Reviewed by clinician on day of visit: allergies, medications, problem list, medical history, surgical history, family history, social history, and previous encounter notes pertinent to obesity diagnosis.   I have personally spent 30 minutes total time today in preparation, patient care, nutritional counseling and documentation for this visit, including the following: review of clinical lab tests; review of medical tests/procedures/services.      Dominique Brink, DO DABFM, DABOM [facility] [address] [place], [place] [address] [phone]

## 2022-09-19 NOTE — Assessment & Plan Note (Signed)
Overall, she has lost 3 lb in 6 weeks, losing some skeletal muscle mass following a fall She is back to regular exercise and has done a better job getting in foods We reviewed some gluten free items and meat alternatives handout given Also reviewed some plant based protein items  Continue cat 3 meal plan with slight variatrions

## 2022-09-19 NOTE — Assessment & Plan Note (Signed)
Worsened by Ozempic and higher protein diet Has Duloclax but is rarely taking it  Plan: can add OTC Miralax 17 mg daily as needed.  Increase water intake to 90 + oz/ day.  Increase intake of veggies >> fruit. Limit intake of cheese

## 2022-09-23 DIAGNOSIS — Z0279 Encounter for issue of other medical certificate: Secondary | ICD-10-CM

## 2022-10-10 ENCOUNTER — Ambulatory Visit: Payer: 59 | Admitting: Family

## 2022-10-10 ENCOUNTER — Encounter: Payer: Self-pay | Admitting: Family

## 2022-10-10 VITALS — BP 109/74 | HR 77 | Temp 98.0°F | Ht 67.0 in | Wt 202.1 lb

## 2022-10-10 DIAGNOSIS — R635 Abnormal weight gain: Secondary | ICD-10-CM

## 2022-10-10 DIAGNOSIS — F4310 Post-traumatic stress disorder, unspecified: Secondary | ICD-10-CM

## 2022-10-10 DIAGNOSIS — F5102 Adjustment insomnia: Secondary | ICD-10-CM | POA: Diagnosis not present

## 2022-10-10 DIAGNOSIS — E669 Obesity, unspecified: Secondary | ICD-10-CM

## 2022-10-10 DIAGNOSIS — F411 Generalized anxiety disorder: Secondary | ICD-10-CM

## 2022-10-10 MED ORDER — PRAZOSIN HCL 2 MG PO CAPS: 4.0000 mg | ORAL_CAPSULE | Freq: Every day | ORAL | 3 refills | Status: DC

## 2022-10-10 MED ORDER — OZEMPIC (0.25 OR 0.5 MG/DOSE) 2 MG/3ML ~~LOC~~ SOPN: 0.5000 mg | PEN_INJECTOR | SUBCUTANEOUS | 3 refills | Status: DC

## 2022-10-10 NOTE — Assessment & Plan Note (Deleted)
chronic also hx of Depression & PTSD Taking Zoloft 200mg qhs, Prazosin 2mg qhs, Bupropion 100mg qd to help offset low labido & wt gain from Zoloft active in therapy pt reports having some trouble w/sleep and waking up yelling, doesn't remember specific nightmares advised ok to try 2 caps of Prazosin and see if helps - monitor how BP doing during day & not too drowsy in am, let me know if ok and I will send more pills no refills needed today, Bupropion sent to local [facility] in jan for 90d, then sent to [facility] in March for 90d as this is preferred pharm but advised pt they will not send until closer to finishing local RX f/u 6 mos 

## 2022-10-10 NOTE — Progress Notes (Signed)
Patient ID: Dominique Davis, female    DOB: April 24, 1986, 37 y.o.   MRN: [id number]  Chief Complaint  Patient presents with   Weight Loss    HPI: Abnormal wt gain:  pt has been taking Saxenda 3mg  qd, ran out as her pharmacy did not have in stock, then heard from her ins that they are no longer covering as she gained a little weight while taking and did not lose the percentage needed to remain on medication. Pt is on 0.5mg  of  Ozempic, wt is stable, no gain or loss. Anxiety/Depression: Patient complains of anxiety disorder.   She has the following symptoms: difficulty concentrating, fatigue, insomnia, irritable, racing thoughts, nightmares. Onset of symptoms was approximately  years ago, She denies current suicidal and homicidal ideation. Possible organic causes contributing are: none.  Risk factors: negative life event childhood trauma and previous episode of depression  She is currently taking Zoloft and Wellbutrin, Prazosin 2mg  qhs for PTSD sx.  Assessment & Plan:  Abnormal weight gain Assessment & Plan: chronic taking Ozempic 0.5mg  qweek but just started recently and wants to stay on this dose still seeing [facility] wt is holding steady, continue to advised on wt loss strategies no refill needed today f/u 2-3 months  Orders: -     Ozempic (0.25 or 0.5 MG/DOSE); Inject 0.5 mg into the skin once a week.  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  Dispense: 90 capsule; Refill: 3  Obesity (BMI 30-39.9) -     Ozempic (0.25 or  0.5 MG/DOSE); Inject 0.5 mg into the skin once a week.  Dispense: 9 mL; Refill: 3  PTSD (post-traumatic stress disorder) Assessment & Plan: chronic, Unstable also hx of Depression, insomnia & Anxiety Taking Zoloft 200mg  qhs, Prazosin 2mg  qhs, Bupropion 100mg  qd to help offset low labido & wt gain from Zoloft active in therapy pt reports having some trouble w/sleep and waking up yelling, doesn't remember specific nightmares advised ok to try 2 caps of Prazosin and see if helps - monitor how BP doing during day & not too drowsy in am, let me know if ok and I will send more pills f/u 6 mos  Orders: -     Prazosin HCl; Take 2 capsules (4 mg total) by mouth at bedtime.  Dispense: 90 capsule; Refill: 3  GAD (generalized anxiety disorder) Assessment & Plan: chronic also hx of Depression & PTSD Taking Zoloft 200mg  qhs, Prazosin 2mg  qhs, Bupropion 100mg  qd to help offset low labido & wt gain from Zoloft active in therapy pt reports having some trouble w/sleep and waking up yelling, doesn't remember specific nightmares advised ok to try 2 caps of Prazosin and see if helps - monitor how BP doing during day & not too drowsy in am, let me know if ok and I will send more pills no refills needed today, Bupropion sent to local [facility] in jan for 90d, then sent to [facility] in March  for 90d as this is preferred pharm but advised pt they will not send until closer to finishing local RX f/u 6 mos    Subjective:    Outpatient Medications Prior to Visit  Medication Sig Dispense Refill   buPROPion ER (WELLBUTRIN SR) 100 MG 12 hr tablet Take 1 tablet (100 mg total) by mouth in the morning. 90 tablet 1   EPINEPHrine 0.3 mg/0.3 mL IJ SOAJ injection INJECT 0.3 ML (0.3 MG) INTO THE MUSCLE ONCE AS NEEDED AS DIRECTED 1 each 1   fluticasone (FLONASE) 50 MCG/ACT nasal spray Place 2 sprays into both nostrils daily. 16 g 6   norethindrone-ethinyl estradiol (JUNEL 1/20) 1-20 MG-MCG tablet Take 1 tablet by mouth  daily. as directed 63 tablet 3   Probiotic Product (CULTURELLE PROBIOTICS PO) Take by mouth daily.     RESTASIS 0.05 % ophthalmic emulsion      sertraline (ZOLOFT) 100 MG tablet TAKE 2 TABLETS BY MOUTH  DAILY 180 tablet 3   sodium chloride (OCEAN) 0.65 % SOLN nasal spray Place 1 spray into both nostrils as needed for congestion. 88 mL 0   Vitamin D, Ergocalciferol, (DRISDOL) 1.25 MG (50000 UNIT) CAPS capsule Take 1 capsule (50,000 Units total) by mouth every 7 (seven) days. 5 capsule 0   OZEMPIC, 0.25 OR 0.5 MG/DOSE, 2 MG/3ML SOPN INJECT 0.25 MG SUBCUTANEOUSLY  WEEKLY FOR 4 WEEKS, THEN 0.5 MG  WEEKLY THEREAFTER (INJECT AT 12  NOON) 9 mL 3   prazosin (MINIPRESS) 2 MG capsule TAKE 1 CAPSULE BY MOUTH AT  BEDTIME TO HELP WITH SLEEP AND  PREVENT NIGHTMARES 90 capsule 3   diclofenac (VOLTAREN) 75 MG EC tablet Take 1 tablet (75 mg total) by mouth 2 (two) times daily as needed. (Patient not taking: Reported on 10/10/2022) 60 tablet 2   ferrous sulfate 324 MG TBEC Take 324 mg by mouth. (Patient not taking: Reported on 10/10/2022)     metFORMIN (GLUCOPHAGE-XR) 500 MG 24 hr tablet TAKE 1 TABLET BY MOUTH TWICE  DAILY AFTER A MEAL.OK TO TAKE 2  TABLETS IN THE MORNING INSTEAD  OF TWICE DAILY IF DESIRED. (Patient not taking: Reported on 10/10/2022) 180 tablet 3   Omega-3 Fatty Acids (FISH OIL PO) Take by mouth daily. (Patient not taking: Reported on 10/10/2022)     No facility-administered medications prior to visit.   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  Constitutional:      Appearance: Normal appearance. She is obese.  Cardiovascular:  Rate and Rhythm: Normal rate and regular rhythm.  Pulmonary:     Effort: Pulmonary effort is normal.     Breath sounds: Normal breath sounds.  Musculoskeletal:        General: Normal range of motion.  Skin:    General: Skin is warm and dry.  Neurological:     Mental Status: She is alert.  Psychiatric:        Mood and Affect: Mood normal.        Behavior: Behavior normal.    BP 109/74   Pulse 77   Temp 98 F (36.7 C) (Temporal)   Ht 5\' 7"  (1.702 m)   Wt 202 lb 2 oz (91.7 kg)   SpO2 98%   BMI 31.66 kg/m  Wt Readings from Last 3 Encounters:  10/10/22 202 lb 2 oz (91.7 kg)  09/19/22 201 lb (91.2 kg)  08/17/22 203 lb (92.1 kg)       Dulce Sellar, NP

## 2022-10-10 NOTE — Assessment & Plan Note (Addendum)
chronic taking Ozempic 0.5mg  qweek but just started recently and wants to stay on this dose still seeing [facility] wt is holding steady, continue to advised on wt loss strategies no refill needed today f/u 2-3 months

## 2022-10-10 NOTE — Assessment & Plan Note (Addendum)
chronic, Unstable also hx of Depression, insomnia & Anxiety Taking Zoloft 200mg  qhs, Prazosin 2mg  qhs, Bupropion 100mg  qd to help offset low labido & wt gain from Zoloft active in therapy pt reports having some trouble w/sleep and waking up yelling, doesn't remember specific nightmares advised ok to try 2 caps of Prazosin and see if helps - monitor how BP doing during day & not too drowsy in am, let me know if ok and I will send more pills f/u 6 mos

## 2022-10-10 NOTE — Assessment & Plan Note (Addendum)
chronic, Unstable taking Prazosin 2mg  qhs still waking at times and yelling out, some trouble with sleep would like to increase dose again if possible advised to increase to 2 capsules = 4 mg, as there is not 3mg  dose, would require 2 separate RX, can let me know though if too strong or if working as will need more pills sent in f/u in 3 mos or prn

## 2022-10-10 NOTE — Assessment & Plan Note (Signed)
chronic also hx of Depression & PTSD Taking Zoloft 200mg  qhs, Prazosin 2mg  qhs, Bupropion 100mg  qd to help offset low labido & wt gain from Zoloft active in therapy pt reports having some trouble w/sleep and waking up yelling, doesn't remember specific nightmares advised ok to try 2 caps of Prazosin and see if helps - monitor how BP doing during day & not too drowsy in am, let me know if ok and I will send more pills no refills needed today, Bupropion sent to local [facility] jan for 90d, then sent to [facility] in March for 90d as this is preferred pharm but advised pt they will not send until closer to finishing local RX f/u 6 mos

## 2022-10-18 ENCOUNTER — Encounter (INDEPENDENT_AMBULATORY_CARE_PROVIDER_SITE_OTHER): Payer: Self-pay | Admitting: Family Medicine

## 2022-10-18 ENCOUNTER — Ambulatory Visit (INDEPENDENT_AMBULATORY_CARE_PROVIDER_SITE_OTHER): Payer: 59 | Admitting: Family Medicine

## 2022-10-18 VITALS — BP 108/74 | HR 72 | Temp 98.4°F | Ht 67.0 in | Wt 199.0 lb

## 2022-10-18 DIAGNOSIS — R635 Abnormal weight gain: Secondary | ICD-10-CM

## 2022-10-18 DIAGNOSIS — E785 Hyperlipidemia, unspecified: Secondary | ICD-10-CM

## 2022-10-18 DIAGNOSIS — E669 Obesity, unspecified: Secondary | ICD-10-CM | POA: Diagnosis not present

## 2022-10-18 DIAGNOSIS — Z6831 Body mass index (BMI) 31.0-31.9, adult: Secondary | ICD-10-CM

## 2022-10-18 DIAGNOSIS — E282 Polycystic ovarian syndrome: Secondary | ICD-10-CM | POA: Diagnosis not present

## 2022-10-18 DIAGNOSIS — E559 Vitamin D deficiency, unspecified: Secondary | ICD-10-CM

## 2022-10-18 MED ORDER — VITAMIN D (ERGOCALCIFEROL) 1.25 MG (50000 UNIT) PO CAPS: 50000.0000 [IU] | ORAL_CAPSULE | ORAL | 0 refills | Status: DC

## 2022-10-18 NOTE — Assessment & Plan Note (Signed)
Reviewed her bioimpedence results She had added in starch serving with dinner which has helped with improved satiety She is more mindful of getting in fiber with meals and her constipation is improving. She was consuming >2 packets of powerade zero daily and was feeling a bit bloated She plans to increase exercise once headaches improve  We discussed using a divided plate at dinner We discussed plant based protein options Reduce sugar free electrolytes to one packet daily, diluted in more water Plan to ramp up walking time once feeling better

## 2022-10-18 NOTE — Progress Notes (Signed)
Office: [phone]  /  Fax: [phone]  WEIGHT SUMMARY AND BIOMETRICS  Starting Date: 08/03/22  Starting Weight: 206 lb   Weight Lost Since Last Visit: 2 lb   Vitals Temp: 98.4 F (36.9 C) BP: 108/74 Pulse Rate: 72 SpO2: 99 %   Body Composition  Body Fat %: 39.7 % Fat Mass (lbs): 79 lbs Muscle Mass (lbs): 114.2 lbs Total Body Water (lbs): 88.8 lbs Visceral Fat Rating : 7     HPI  Chief Complaint: OBESITY  Dominique Davis is here to discuss her progress with her obesity treatment plan. She is on the the Category 3 Plan and states she is following her eating plan approximately 80 % of the time. She states she is exercising 30-60 minutes 2-3 times per week.   Interval History:  Since last office visit she is down 2 lb This gives her a net weight loss of 7 lb in the past 2 mos She had a heavy period x 5 days which was the first in a while She has been having menstrual migraines She did add in some rice  She stopped metformin -- less constipated now (prescribed by PCP) She has adequate satiety on Ozempic 0.5 mg  She hasn't been as physically active due there menses and headaches She is drinking more water She is still lacking sleep on some nights She is maintaining her muscle mass  Pharmacotherapy: Ozempic 0.5 mg weekly  PHYSICAL EXAM:  Blood pressure 108/74, pulse 72, temperature 98.4 F (36.9 C), height 5\' 7"  (1.702 m), weight 199 lb (90.3 kg), SpO2 99 %. Body mass index is 31.17 kg/m.  General: She is overweight, cooperative, alert, well developed, and in no acute distress. PSYCH: Has normal mood, affect and thought process.   Lungs: Normal breathing effort, no conversational dyspnea.   ASSESSMENT AND PLAN  TREATMENT PLAN FOR OBESITY:  Recommended Dietary Goals  Dominique Davis is currently in the action stage of change. As such, her goal is to continue weight management plan. She has agreed to the Category 3 Plan.  Behavioral Intervention  We  discussed the following Behavioral Modification Strategies today: increasing lean protein intake, decreasing simple carbohydrates , increasing vegetables, increasing lower glycemic fruits, increasing fiber rich foods, avoiding skipping meals, increasing water intake, work on meal planning and preparation, continue to practice mindfulness when eating, and planning for success.  Additional resources provided today: NA  Recommended Physical Activity Goals  Dominique Davis has been advised to work up to 150 minutes of moderate intensity aerobic activity a week and strengthening exercises 2-3 times per week for cardiovascular health, weight loss maintenance and preservation of muscle mass.   She has agreed to Work on scheduling and tracking physical activity.   Pharmacotherapy changes for the treatment of obesity: Ozempic 0.5 mg weekly  ASSOCIATED CONDITIONS ADDRESSED TODAY  Vitamin D deficiency Assessment & Plan: Last vitamin D Lab Results  Component Value Date   VD25OH 30.1 08/03/2022   Doing well on RX vitamin D 50,000 IU weekly Energy level is improving  Refilled RX vitamin D 50,000 IU weekly #12 tab, 0 RF Recheck level in June  Orders: -     Vitamin D (Ergocalciferol); Take 1 capsule (50,000 Units total) by mouth every 7 (seven) days.  Dispense: 12 capsule; Refill: 0  Generalized obesity Assessment & Plan: Reviewed her bioimpedence results She had added in starch serving with dinner which has helped with improved satiety She is more mindful of getting in fiber with meals and her constipation is  improving. She was consuming >2 packets of powerade zero daily and was feeling a bit bloated She plans to increase exercise once headaches improve  We discussed using a divided plate at dinner We discussed plant based protein options Reduce sugar free electrolytes to one packet daily, diluted in more water Plan to ramp up walking time once feeling better   BMI 31.0-31.9,adult  PCOS  (polycystic ovarian syndrome) Assessment & Plan: She is off of metformin (prescribed by PCP for PCOS) and her constipation has improved.  She is off label using Ozempic for insulin resistance related to PCOS without a diagnosis of T2DM.  She had a period for the first time in a while.    Continue current medications per PCP Continue working on weight reduction and a low sugar/ low starch diet with regular exercise.       She was informed of the importance of frequent follow up visits to maximize her success with intensive lifestyle modifications for her multiple health conditions.   ATTESTASTION STATEMENTS:  Reviewed by clinician on day of visit: allergies, medications, problem list, medical history, surgical history, family history, social history, and previous encounter notes pertinent to obesity diagnosis.   I have personally spent 30 minutes total time today in preparation, patient care, nutritional counseling and documentation for this visit, including the following: review of clinical lab tests; review of medical tests/procedures/services.      Glennis Brink, DO DABFM, DABOM [facility] [address] [place], [place] [address] [phone]

## 2022-10-18 NOTE — Assessment & Plan Note (Signed)
Last vitamin D Lab Results  Component Value Date   VD25OH 30.1 08/03/2022   Doing well on RX vitamin D 50,000 IU weekly Energy level is improving  Refilled RX vitamin D 50,000 IU weekly #12 tab, 0 RF Recheck level in June

## 2022-10-18 NOTE — Assessment & Plan Note (Signed)
She is off of metformin (prescribed by PCP for PCOS) and her constipation has improved.  She is off label using Ozempic for insulin resistance related to PCOS without a diagnosis of T2DM.  She had a period for the first time in a while.    Continue current medications per PCP Continue working on weight reduction and a low sugar/ low starch diet with regular exercise.

## 2022-10-21 ENCOUNTER — Other Ambulatory Visit: Payer: Self-pay | Admitting: Family

## 2022-10-21 DIAGNOSIS — F4310 Post-traumatic stress disorder, unspecified: Secondary | ICD-10-CM

## 2022-10-21 DIAGNOSIS — F424 Excoriation (skin-picking) disorder: Secondary | ICD-10-CM

## 2022-10-21 DIAGNOSIS — F331 Major depressive disorder, recurrent, moderate: Secondary | ICD-10-CM

## 2022-10-22 ENCOUNTER — Encounter (INDEPENDENT_AMBULATORY_CARE_PROVIDER_SITE_OTHER): Payer: Self-pay

## 2022-11-01 NOTE — Addendum Note (Signed)
Addended by: Glennis Brink on: 11/01/2022 04:13 PM   Modules accepted: Orders

## 2022-11-24 ENCOUNTER — Ambulatory Visit (INDEPENDENT_AMBULATORY_CARE_PROVIDER_SITE_OTHER): Payer: 59 | Admitting: Family Medicine

## 2022-11-24 LAB — LIPID PANEL
Chol/HDL Ratio: 3 ratio (ref 0.0–4.4)
Cholesterol, Total: 175 mg/dL (ref 100–199)
HDL: 59 mg/dL (ref >39)
LDL Chol Calc (NIH): 102 mg/dL — ABNORMAL HIGH (ref 0–99)
Triglycerides: 73 mg/dL (ref 0–149)
VLDL Cholesterol Cal: 14 mg/dL (ref 5–40)

## 2022-11-24 LAB — COMPREHENSIVE METABOLIC PANEL
ALT: 10 IU/L (ref 0–32)
AST: 13 IU/L (ref 0–40)
Albumin: 4.4 g/dL (ref 3.9–4.9)
Alkaline Phosphatase: 32 IU/L — ABNORMAL LOW (ref 44–121)
BUN/Creatinine Ratio: 20 (ref 9–23)
BUN: 20 mg/dL (ref 6–20)
Bilirubin Total: 0.2 mg/dL (ref 0.0–1.2)
CO2: 23 mmol/L (ref 20–29)
Calcium: 8.9 mg/dL (ref 8.7–10.2)
Chloride: 102 mmol/L (ref 96–106)
Creatinine, Ser: 0.99 mg/dL (ref 0.57–1.00)
Globulin, Total: 2.2 g/dL (ref 1.5–4.5)
Glucose: 81 mg/dL (ref 70–99)
Potassium: 4.2 mmol/L (ref 3.5–5.2)
Sodium: 138 mmol/L (ref 134–144)
Total Protein: 6.6 g/dL (ref 6.0–8.5)
eGFR: 76 mL/min/{1.73_m2} (ref >59)

## 2022-11-24 LAB — VITAMIN D 25 HYDROXY (VIT D DEFICIENCY, FRACTURES): Vit D, 25-Hydroxy: 69.2 ng/mL (ref 30.0–100.0)

## 2022-11-29 ENCOUNTER — Ambulatory Visit (INDEPENDENT_AMBULATORY_CARE_PROVIDER_SITE_OTHER): Payer: 59 | Admitting: Family Medicine

## 2022-11-29 ENCOUNTER — Encounter (INDEPENDENT_AMBULATORY_CARE_PROVIDER_SITE_OTHER): Payer: Self-pay | Admitting: Family Medicine

## 2022-11-29 VITALS — BP 106/72 | HR 96 | Temp 98.6°F | Ht 67.0 in | Wt 191.0 lb

## 2022-11-29 DIAGNOSIS — F331 Major depressive disorder, recurrent, moderate: Secondary | ICD-10-CM

## 2022-11-29 DIAGNOSIS — E282 Polycystic ovarian syndrome: Secondary | ICD-10-CM | POA: Diagnosis not present

## 2022-11-29 DIAGNOSIS — Z6829 Body mass index (BMI) 29.0-29.9, adult: Secondary | ICD-10-CM

## 2022-11-29 DIAGNOSIS — E559 Vitamin D deficiency, unspecified: Secondary | ICD-10-CM

## 2022-11-29 DIAGNOSIS — E669 Obesity, unspecified: Secondary | ICD-10-CM

## 2022-11-29 NOTE — Assessment & Plan Note (Addendum)
Doing well with medical weight management on prescribed dietary plan, tracking steps and following bioimpedence Seeing additional weight benefit and control of hunger on Ozempic per PCP.  Continue to supplement skipped meal with a protein shake, bar or greek yogurt

## 2022-11-29 NOTE — Progress Notes (Signed)
Office: [phone]  /  Fax: [phone]  WEIGHT SUMMARY AND BIOMETRICS  Starting Date: 08/03/22  Starting Weight: 206lb   Weight Lost Since Last Visit: 8lb   Vitals Temp: 98.6 F (37 C) BP: 106/72 Pulse Rate: 96 SpO2: 96 %   Body Composition  Body Fat %: 36.9 % Fat Mass (lbs): 70.8 lbs Muscle Mass (lbs): 114.8 lbs Total Body Water (lbs): 87.4 lbs Visceral Fat Rating : 7   HPI  Chief Complaint: OBESITY  Daizy is here to discuss her progress with her obesity treatment plan. She is on the the Category 3 Plan and states she is following her eating plan approximately 80 % of the time. She states she is exercising 30 minutes 2-3 times per week.   Interval History:  Since last office visit she is down 9 lb She gained 0.6 lb of muscle and lost 8.2 lb of body fat in the past month She is doing well on Ozempic 0.5 mg weekly, started by PCP for PCOS off label She has some constipation and nausea, mild She is enjoying early satiety and is more prone to skipping meals She has not been drinking as much water In therapy for high stress levels and is sleeping better with the addition of CBD She added back in gluten and is doing OK This gives her a net weight loss of 15 lb in the past 3 mos of medically supervised weight management.  This is a 7.2% TBW loss  Pharmacotherapy: Ozempic 0.5 mg weekly  PHYSICAL EXAM:  Blood pressure 106/72, pulse 96, temperature 98.6 F (37 C), height 5\' 7"  (1.702 m), weight 191 lb (86.6 kg), SpO2 96 %. Body mass index is 29.91 kg/m.  General: She is overweight, cooperative, alert, well developed, and in no acute distress. PSYCH: Has normal mood, affect and thought process.   Lungs: Normal breathing effort, no conversational dyspnea.   ASSESSMENT AND PLAN  TREATMENT PLAN FOR OBESITY:  Recommended Dietary Goals  [place] is currently in the action stage of change. As such, her goal is to continue weight management plan. She has  agreed to the Category 3 Plan.  Behavioral Intervention  We discussed the following Behavioral Modification Strategies today: increasing lean protein intake, decreasing simple carbohydrates , increasing vegetables, increasing lower glycemic fruits, avoiding skipping meals, increasing water intake, work on meal planning and preparation, continue to practice mindfulness when eating, and planning for success.  Additional resources provided today: NA  Recommended Physical Activity Goals  Jenavi has been advised to work up to 150 minutes of moderate intensity aerobic activity a week and strengthening exercises 2-3 times per week for cardiovascular health, weight loss maintenance and preservation of muscle mass.   She has agreed to Work on scheduling and tracking physical activity.   Pharmacotherapy changes for the treatment of obesity: none  ASSOCIATED CONDITIONS ADDRESSED TODAY  Vitamin D deficiency Assessment & Plan: Last vitamin D Lab Results  Component Value Date   VD25OH 69.2 11/23/2022   Reviewed her most recent vitamin D level, up from 30.1 after 3 mos of RX vitamin D weekly. Energy level has improved  D/c RX vitamin D and change to OTC Vitamin D 2,000 international units  daily for maitenance   Generalized obesity with starting BMI 32 Assessment & Plan: Doing well with medical weight management on prescribed dietary plan, tracking steps and following bioimpedence Seeing additional weight benefit and control of hunger on Ozempic per PCP.  Continue to supplement skipped meal with a protein  shake, bar or greek yogurt   BMI 29.0-29.9,adult  PCOS (polycystic ovarian syndrome) Assessment & Plan: Still having irregular menses on OCPs Using Ozempic off label for IR related to PCOS without a formal dx of T2DM and is doing well with body fat reduction and improved satiety. She has minimized lean muscle loss by supplementing with high protein snacks as reviewed last visit. She  is getting 10,000 steps most days of the week  Continue Ozempic 0.5 mg weekly per PCP along with a low sugar diet rich in lean protein and fiber Continue to track walking   Moderate episode of recurrent major depressive disorder (HCC) Assessment & Plan: Reports stable mood on sertraline 100 mg daily and Wellbutrin SR 100 mg daily. Has a good support system and is in therapy Sleep has started to improve with use of CBD gummies  Continue current medications Continue to work on stress reduction, self care and proper sleep at night       She was informed of the importance of frequent follow up visits to maximize her success with intensive lifestyle modifications for her multiple health conditions.   ATTESTASTION STATEMENTS:  Reviewed by clinician on day of visit: allergies, medications, problem list, medical history, surgical history, family history, social history, and previous encounter notes pertinent to obesity diagnosis.   I have personally spent 30 minutes total time today in preparation, patient care, nutritional counseling and documentation for this visit, including the following: review of clinical lab tests; review of medical tests/procedures/services.      Glennis Brink, DO DABFM, DABOM [facility] [address] [place], [place] [address] [phone]

## 2022-11-29 NOTE — Assessment & Plan Note (Signed)
Still having irregular menses on OCPs Using Ozempic off label for IR related to PCOS without a formal dx of T2DM and is doing well with body fat reduction and improved satiety. She has minimized lean muscle loss by supplementing with high protein snacks as reviewed last visit. She is getting 10,000 steps most days of the week  Continue Ozempic 0.5 mg weekly per PCP along with a low sugar diet rich in lean protein and fiber Continue to track walking

## 2022-11-29 NOTE — Assessment & Plan Note (Addendum)
Last vitamin D Lab Results  Component Value Date   VD25OH 69.2 11/23/2022   Reviewed her most recent vitamin D level, up from 30.1 after 3 mos of RX vitamin D weekly. Energy level has improved  D/c RX vitamin D and change to OTC Vitamin D 2,000 international units  daily for maitenance

## 2022-11-29 NOTE — Assessment & Plan Note (Signed)
Reports stable mood on sertraline 100 mg daily and Wellbutrin SR 100 mg daily. Has a good support system and is in therapy Sleep has started to improve with use of CBD gummies  Continue current medications Continue to work on stress reduction, self care and proper sleep at night

## 2022-12-11 ENCOUNTER — Other Ambulatory Visit: Payer: Self-pay | Admitting: Family

## 2022-12-11 DIAGNOSIS — F411 Generalized anxiety disorder: Secondary | ICD-10-CM

## 2022-12-26 ENCOUNTER — Ambulatory Visit: Payer: 59 | Admitting: Family

## 2022-12-26 ENCOUNTER — Encounter: Payer: Self-pay | Admitting: Family

## 2022-12-26 VITALS — BP 103/70 | HR 100 | Temp 98.0°F | Ht 67.0 in | Wt 194.8 lb

## 2022-12-26 DIAGNOSIS — Z0000 Encounter for general adult medical examination without abnormal findings: Secondary | ICD-10-CM | POA: Diagnosis not present

## 2022-12-26 DIAGNOSIS — F5102 Adjustment insomnia: Secondary | ICD-10-CM

## 2022-12-26 MED ORDER — QUVIVIQ 25 MG PO TABS
1.0000 | ORAL_TABLET | Freq: Every day | ORAL | 1 refills | Status: DC
Start: 2022-12-26 — End: 2023-01-05

## 2022-12-26 MED ORDER — PRAZOSIN HCL 2 MG PO CAPS
2.0000 mg | ORAL_CAPSULE | Freq: Every day | ORAL | 1 refills | Status: DC
Start: 2022-12-26 — End: 2022-12-26

## 2022-12-26 NOTE — Progress Notes (Signed)
Phone [phone]   Subjective:   Patient is a 37 y.o. female presenting for annual physical.    Chief Complaint  Patient presents with   Annual Exam    Fasting w/ labs   HPI INSOMNIA:  How long: years. Difficulty initiating sleep: yes.  Difficulty maintaining sleep: yes.  OTC meds tried: benadryl. RX meds in past: Prazosin. Sleep hygiene measures: yes. Started any new meds recently: no. 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 New stressors: anxiety & depression, but doing much better, on zoloft, bupropion & prazosin.   See problem oriented charting- ROS- full  review of systems was completed and negative except for HPI above.  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   CIP/FLAG X7 DAYS(2X 2015)   Patient Active Problem List   Diagnosis Date Noted   Vitamin D deficiency 10/18/2022   Constipation by delayed colonic transit 09/19/2022   Right leg pain 08/17/2022   SOBOE (shortness of breath on exertion) 08/03/2022   Mood disorder (HCC) 08/03/2022   Eating disorder 08/03/2022   Bipolar disorder (HCC) 06/30/2022   Generalized obesity with starting BMI 32 09/22/2021   Abnormal weight gain 09/22/2021   PCOS (polycystic ovarian syndrome) 09/02/2020   Mild hyperlipidemia 03/18/2020   Skin-picking disorder 10/08/2019   PTSD (post-traumatic stress disorder) 08/07/2019   Insomnia due to stress 10/05/2015   MDD (major depressive disorder) 10/05/2015   Food allergy 03/04/2015   Chronic knee pain 03/31/2014   Environmental  allergies 10/16/2013   Migraine headache 04/27/2013   GAD (generalized anxiety disorder) 04/27/2013   Chronic back pain 04/27/2013   Past Surgical History:  Procedure Laterality Date   BACTERIAL OVERGROWTH TEST N/A 01/03/2014   CANCELLED   BACTERIAL OVERGROWTH TEST N/A 01/10/2014   0 PPM AT START, 65 PPM(75 MINS), 58 PPM(105 MINS)   WISDOM TOOTH EXTRACTION     x2    Family History  Problem Relation Age of Onset   Hyperlipidemia Mother    Diabetes Mother        gastroparesis   Hypertension Mother    Anxiety disorder Mother    Mental illness Mother        Bipolar Disorder    Alcohol abuse Mother    Depression Mother    Drug abuse Mother    Sleep apnea Mother    Obesity Mother    Hypertension Father    Alcohol abuse Father    Diabetes Maternal Grandmother    Hypertension Maternal Grandmother    Hypertension Paternal Grandmother    Alcohol abuse Paternal Grandfather    Inflammatory bowel disease Neg Hx    Colon cancer Neg Hx     Medications- reviewed and updated Current Outpatient Medications  Medication Sig Dispense Refill   buPROPion ER (WELLBUTRIN SR) 100 MG 12 hr tablet TAKE 1 TABLET(100 MG) BY MOUTH IN THE MORNING 90 tablet 1   Daridorexant HCl (QUVIVIQ) 25 MG TABS Take 1 tablet (25  mg total) by mouth at bedtime. 30 tablet 1   EPINEPHrine 0.3 mg/0.3 mL IJ SOAJ injection INJECT 0.3 ML (0.3 MG) INTO THE MUSCLE ONCE AS NEEDED AS DIRECTED 1 each 1   fluticasone (FLONASE) 50 MCG/ACT nasal spray Place 2 sprays into both nostrils daily. 16 g 6   norethindrone-ethinyl estradiol (JUNEL 1/20) 1-20 MG-MCG tablet Take 1 tablet by mouth daily. as directed 63 tablet 3   Probiotic Product (CULTURELLE PROBIOTICS PO) Take by mouth daily.     RESTASIS 0.05 % ophthalmic emulsion      Semaglutide,0.25 or 0.5MG /DOS, (OZEMPIC, 0.25 OR 0.5 MG/DOSE,) 2 MG/3ML SOPN Inject 0.5 mg into the skin once a week. 9 mL 3   sertraline (ZOLOFT) 100 MG tablet TAKE 2 TABLETS BY MOUTH DAILY 180 tablet 3    sodium chloride (OCEAN) 0.65 % SOLN nasal spray Place 1 spray into both nostrils as needed for congestion. 88 mL 0   No current facility-administered medications for this visit.    Allergies-reviewed and updated Allergies  Allergen Reactions   Black Pepper [Piper] Rash    Social History   Social History Narrative   Not on file   Objective  Objective:  BP 103/70   Pulse 100   Temp 98 F (36.7 C) (Temporal)   Ht 5\' 7"  (1.702 m)   Wt 194 lb 12.8 oz (88.4 kg)   LMP 12/05/2022 (Approximate)   SpO2 96%   BMI 30.51 kg/m  Physical Exam Vitals and nursing note reviewed.  Constitutional:      Appearance: Normal appearance.  HENT:     Head: Normocephalic.     Right Ear: Tympanic membrane normal.     Left Ear: Tympanic membrane normal.     Nose: Nose normal.     Mouth/Throat:     Mouth: Mucous membranes are moist.  Eyes:     Pupils: Pupils are equal, round, and reactive to light.  Cardiovascular:     Rate and Rhythm: Normal rate and regular rhythm.  Pulmonary:     Effort: Pulmonary effort is normal.     Breath sounds: Normal breath sounds.  Musculoskeletal:        General: Normal range of motion.     Cervical back: Normal range of motion.  Lymphadenopathy:     Cervical: No cervical adenopathy.  Skin:    General: Skin is warm and dry.  Neurological:     Mental Status: She is alert.  Psychiatric:        Mood and Affect: Mood normal.        Behavior: Behavior normal.     Assessment and Plan   Health Maintenance counseling: 1. Anticipatory guidance: Patient counseled regarding regular dental exams q6 months, eye exams,  avoiding smoking and second hand smoke, limiting alcohol to 1 beverage per day, no illicit drugs.   2. Risk factor reduction:  Advised patient of need for regular exercise and diet rich with fruits and vegetables to reduce risk of heart attack and stroke. Wt Readings from Last 3 Encounters:  12/26/22 194 lb 12.8 oz (88.4 kg)  11/29/22 191 lb (86.6  kg)  10/18/22 199 lb (90.3 kg)   3. Immunizations/screenings/ancillary studies Immunization History  Administered Date(s) Administered   DTaP 06/15/1987, 04/12/1988, 12/11/1991   Hepatitis B 03/18/1998, 04/22/1998, 09/09/1998   IPV 04/12/1988, 12/11/1991   Influenza,inj,Quad PF,6+ Mos 04/26/2013, 03/04/2015, 06/20/2016, 07/07/2017, 01/23/2019, 03/18/2020, 04/19/2021, 04/04/2022   MMR 04/12/1988, 12/11/1991   PFIZER(Purple Top)SARS-COV-2 Vaccination 08/19/2019, 09/10/2019   Tdap 12/30/2014  Health Maintenance Due  Topic Date Due   COVID-19 Vaccine (3 - 2023-24 season) 02/04/2022    4. Cervical cancer screening: last 2024 5. Skin cancer screening- advised regular sunscreen use. Denies worrisome, changing, or new skin lesions.  6. Birth control/STD check: OCPs, N/A 7. Smoking associated screening: non- smoker 8. 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 Has insurance form to be completed.      Recommended follow up: No follow-ups on file. Future Appointments  Date Time Provider Department Center  01/05/2023  2:40 PM Bowen, Scot Jun, DO [facility] None  01/09/2023  2:00 PM Dulce Sellar, NP [facility]     Dulce Sellar, NP

## 2022-12-26 NOTE — Patient Instructions (Addendum)
It was very nice to see you today!   I sent over the new sleep medication, Quviviq. Ok to take with 1 Prazosin now, but if too drowsy in am, we can cut the Prazosin dose down to 1mg  and see how you do.    PLEASE NOTE:  If you had any lab tests please let us know if you have not heard back within a few days. You may see your results on MyChart before we have a chance to review them but we will give you a call once they are reviewed by [place]. If we ordered any referrals today, please let us know if you have not heard from their office within the next week.

## 2022-12-26 NOTE — Assessment & Plan Note (Signed)
chronic, Unstable taking Prazosin 2mg  at bedtime, tried to increase to 4mg  but caused her to be too drowsy in am and blood pressure dropped too low reports med helps her not yell out, have nightmares but trouble with sleep still adding Quviviq 25mg  at bedtime, may require PA, advised on use & SE f/u in 3 mos or prn

## 2022-12-30 ENCOUNTER — Other Ambulatory Visit (HOSPITAL_COMMUNITY): Payer: Self-pay

## 2022-12-30 ENCOUNTER — Telehealth: Payer: Self-pay

## 2022-12-30 DIAGNOSIS — F5102 Adjustment insomnia: Secondary | ICD-10-CM

## 2022-12-30 NOTE — Telephone Encounter (Signed)
Pharmacy Patient Advocate Encounter   Received notification from CoverMyMeds that prior authorization for Quviviq 25MG  tablets is required/requested.   Insurance verification completed.   The patient is insured through [facility] .   Per test claim: PA required; PA submitted to [facility] via CoverMyMeds Key/confirmation #/EOC [id number] Status is pending

## 2023-01-02 ENCOUNTER — Encounter: Payer: Self-pay | Admitting: Family

## 2023-01-02 NOTE — Telephone Encounter (Signed)
Pharmacy Patient Advocate Encounter  Received notification from [facility] that Prior Authorization for Quviviq 25MG  tablets has been DENIED. Please advise how you'd like to proceed. Full denial letter will be uploaded to the media tab. See denial reason below.  PA #/Case ID/Reference #: [id number]  Denial reason: Must have tried or cannot use at least three covered drugs

## 2023-01-03 NOTE — Telephone Encounter (Signed)
Let [name] know the Delrae Rend is not covered, they want her to try another med first. If she is ok with it, I can send in [facility] to try - this is in the same class. Let me know if ok.

## 2023-01-05 ENCOUNTER — Encounter (INDEPENDENT_AMBULATORY_CARE_PROVIDER_SITE_OTHER): Payer: Self-pay | Admitting: Family Medicine

## 2023-01-05 ENCOUNTER — Ambulatory Visit (INDEPENDENT_AMBULATORY_CARE_PROVIDER_SITE_OTHER): Payer: 59 | Admitting: Family Medicine

## 2023-01-05 VITALS — BP 115/72 | HR 84 | Temp 98.1°F | Ht 67.0 in | Wt 191.0 lb

## 2023-01-05 DIAGNOSIS — E282 Polycystic ovarian syndrome: Secondary | ICD-10-CM | POA: Diagnosis not present

## 2023-01-05 DIAGNOSIS — Z6829 Body mass index (BMI) 29.0-29.9, adult: Secondary | ICD-10-CM

## 2023-01-05 DIAGNOSIS — E669 Obesity, unspecified: Secondary | ICD-10-CM

## 2023-01-05 DIAGNOSIS — N946 Dysmenorrhea, unspecified: Secondary | ICD-10-CM | POA: Diagnosis not present

## 2023-01-05 DIAGNOSIS — F39 Unspecified mood [affective] disorder: Secondary | ICD-10-CM

## 2023-01-05 MED ORDER — DAYVIGO 5 MG PO TABS
1.0000 | ORAL_TABLET | Freq: Every evening | ORAL | 1 refills | Status: DC | PRN
Start: 2023-01-05 — End: 2023-12-15

## 2023-01-05 NOTE — Assessment & Plan Note (Signed)
Pt c/o heavy painful menses, irregular in nature with a hx of PCOS.  At her last workup by OB [facility]), an ultrasound was normal and she was put on OCPs.  She is currently on her menses though she had not had one for months.  She reports heavy, painful, irregular cycles and no longer has associated food cravings but does have nausea.    We discussed symptoms of endometriosis and how this could be affecting her ability to lose weight.  Both fatigue and pain are hindering her ability to exercise.  Will cc her PCP for further eval and treatment.  We discussed exploratory laparoscopy as the only way to diagnose endometriosis.

## 2023-01-05 NOTE — Assessment & Plan Note (Signed)
With 15 lb of weight loss in the past 5 mos of medically supervised weight management, her menses have been coming more frequently.  She is off of metformin now that she is on Ozempic by her PCP, off label to help with IR related to PCOS and weight loss.  Continue OCPs and Ozempic per PCP Watch for more frequent menstrual cycles with weight loss

## 2023-01-05 NOTE — Telephone Encounter (Signed)
Sent pt MyChart message.

## 2023-01-05 NOTE — Assessment & Plan Note (Signed)
High stress levels continue and patient is in counseling.  Her boyfriend is supportive.  She has mood improvements on Wellbutrin SR 100 mg daily and Sertraline 200 mg daily by PCP.  She continues to struggle with poor sleep at night. Food noise and mindful eating have improved  Continue current meds and counseling for mood disorder Continue to work on improving sleep quality at night Focus on keeping trigger foods out of the house and practicing mindful eating/ eating on a schedule

## 2023-01-05 NOTE — Telephone Encounter (Signed)
sending Dayvigo & no, does not need to stop Prazosin, but let me know if too drowsy in am with the [facility]. Start with 1 pill for a few nights, then can take 2 pills if not working. One hour before bedtime. Thx

## 2023-01-05 NOTE — Addendum Note (Signed)
Addended byDulce Sellar on: 01/05/2023 08:15 AM   Modules accepted: Orders

## 2023-01-05 NOTE — Progress Notes (Signed)
Office: [phone]  /  Fax: [phone]  WEIGHT SUMMARY AND BIOMETRICS  Starting Date: 08/03/22  Starting Weight: 206lb   Weight Lost Since Last Visit: 0lb   Vitals Temp: 98.1 F (36.7 C) BP: 115/72 Pulse Rate: 84 SpO2: 97 %   Body Composition  Body Fat %: 37.1 % Fat Mass (lbs): 70.8 lbs Muscle Mass (lbs): 114.2 lbs Total Body Water (lbs): 86.4 lbs Visceral Fat Rating : 7     HPI  Chief Complaint: OBESITY  Dominique Davis is here to discuss her progress with her obesity treatment plan. She is on the the Category 3 Plan and states she is following her eating plan approximately 75 % of the time. She states she is exercising 30 minutes 2-3 times per week.   Interval History:  Since last office visit she is down 0 lb She has work related stress, in counseling She is down 0.6 lb of muscle mass and is down 0 lb of body fat She is currently on her cycle (irregular with PCOS) She is net down 15 lb in the past 6 mos of medically supervised weight management This is a 7.2% TBW loss She is trying to not skip meals She plans to ramp up exercise and boyfriend is supportive  Pharmacotherapy: Ozempic 0.5  mg weekly injection by PCP (off label)  PHYSICAL EXAM:  Blood pressure 115/72, pulse 84, temperature 98.1 F (36.7 C), height 5\' 7"  (1.702 m), weight 191 lb (86.6 kg), last menstrual period 12/05/2022, SpO2 97%. Body mass index is 29.91 kg/m.  General: She is overweight, cooperative, alert, well developed, and in no acute distress. PSYCH: Has normal mood, affect and thought process.   Lungs: Normal breathing effort, no conversational dyspnea.   ASSESSMENT AND PLAN  TREATMENT PLAN FOR OBESITY:  Recommended Dietary Goals  [name] is currently in the action stage of change. As such, her goal is to continue weight management plan. She has agreed to the Category 3 Plan.  Behavioral Intervention  We discussed the following Behavioral Modification Strategies today:  increasing lean protein intake, decreasing simple carbohydrates , increasing vegetables, increasing lower glycemic fruits, avoiding skipping meals, increasing water intake, keeping healthy foods at home, work on managing stress, creating time for self-care and relaxation measures, avoiding temptations and identifying enticing environmental cues, continue to practice mindfulness when eating, and planning for success.  Additional resources provided today: NA  Recommended Physical Activity Goals  [name] has been advised to work up to 150 minutes of moderate intensity aerobic activity a week and strengthening exercises 2-3 times per week for cardiovascular health, weight loss maintenance and preservation of muscle mass.   She has agreed to Start aerobic activity with a goal of 150 minutes a week at moderate intensity.   Pharmacotherapy changes for the treatment of obesity:  none  ASSOCIATED CONDITIONS ADDRESSED TODAY  Dysmenorrhea Assessment & Plan: Pt c/o heavy painful menses, irregular in nature with a hx of PCOS.  At her last workup by OB [facility]), an ultrasound was normal and she was put on OCPs.  She is currently on her menses though she had not had one for months.  She reports heavy, painful, irregular cycles and no longer has associated food cravings but does have nausea.    We discussed symptoms of endometriosis and how this could be affecting her ability to lose weight.  Both fatigue and pain are hindering her ability to exercise.  Will cc her PCP for further eval and treatment.  We discussed exploratory  laparoscopy as the only way to diagnose endometriosis.   Generalized obesity with starting BMI 32  BMI 29.0-29.9,adult  PCOS (polycystic ovarian syndrome) Assessment & Plan: With 15 lb of weight loss in the past 5 mos of medically supervised weight management, her menses have been coming more frequently.  She is off of metformin now that she is on Ozempic by her PCP, off label  to help with IR related to PCOS and weight loss.  Continue OCPs and Ozempic per PCP Watch for more frequent menstrual cycles with weight loss   Mood disorder [facility]) Assessment & Plan: High stress levels continue and patient is in counseling.  Her boyfriend is supportive.  She has mood improvements on Wellbutrin SR 100 mg daily and Sertraline 200 mg daily by PCP.  She continues to struggle with poor sleep at night. Food noise and mindful eating have improved  Continue current meds and counseling for mood disorder Continue to work on improving sleep quality at night Focus on keeping trigger foods out of the house and practicing mindful eating/ eating on a schedule       She was informed of the importance of frequent follow up visits to maximize her success with intensive lifestyle modifications for her multiple health conditions.   ATTESTASTION STATEMENTS:  Reviewed by clinician on day of visit: allergies, medications, problem list, medical history, surgical history, family history, social history, and previous encounter notes pertinent to obesity diagnosis.   I have personally spent 30 minutes total time today in preparation, patient care, nutritional counseling and documentation for this visit, including the following: review of clinical lab tests; review of medical tests/procedures/services.      Glennis Brink, DO DABFM, DABOM [facility] [address] [place], [place] [address] [phone]

## 2023-01-08 ENCOUNTER — Other Ambulatory Visit (INDEPENDENT_AMBULATORY_CARE_PROVIDER_SITE_OTHER): Payer: Self-pay | Admitting: Family Medicine

## 2023-01-08 DIAGNOSIS — E559 Vitamin D deficiency, unspecified: Secondary | ICD-10-CM

## 2023-01-09 ENCOUNTER — Ambulatory Visit: Payer: 59 | Admitting: Family

## 2023-01-31 ENCOUNTER — Encounter: Payer: Self-pay | Admitting: Family

## 2023-01-31 ENCOUNTER — Encounter (INDEPENDENT_AMBULATORY_CARE_PROVIDER_SITE_OTHER): Payer: Self-pay | Admitting: Family Medicine

## 2023-01-31 ENCOUNTER — Ambulatory Visit (INDEPENDENT_AMBULATORY_CARE_PROVIDER_SITE_OTHER): Payer: 59 | Admitting: Family Medicine

## 2023-01-31 VITALS — BP 112/75 | HR 84 | Temp 98.2°F | Ht 67.0 in | Wt 188.0 lb

## 2023-01-31 DIAGNOSIS — E669 Obesity, unspecified: Secondary | ICD-10-CM | POA: Diagnosis not present

## 2023-01-31 DIAGNOSIS — F32A Depression, unspecified: Secondary | ICD-10-CM | POA: Diagnosis not present

## 2023-01-31 DIAGNOSIS — R102 Pelvic and perineal pain: Secondary | ICD-10-CM

## 2023-01-31 DIAGNOSIS — Z6829 Body mass index (BMI) 29.0-29.9, adult: Secondary | ICD-10-CM

## 2023-01-31 DIAGNOSIS — F419 Anxiety disorder, unspecified: Secondary | ICD-10-CM

## 2023-01-31 NOTE — Progress Notes (Signed)
Chief Complaint:   OBESITY Dominique Davis is here to discuss her progress with her obesity treatment plan along with follow-up of her obesity related diagnoses. Dominique Davis is on the Category 3 Plan and states she is following her eating plan approximately 75% of the time. Dominique Davis states she is doing some light walking for 30 minutes 2-3 times per week.  Today's visit was #: 7 Starting weight: 206 lbs Starting date: 08/03/2022 Today's weight: 188 lbs Today's date: 01/31/2023 Total lbs lost to date: 18 Total lbs lost since last in-office visit: 3  Interim History: Patient here for follow up.  She is interested in learning to put the food together for day in a different way.  Boyfriend cooks for her.  She is doing some walking.    Subjective:   1. Pelvic pain Patient is on Loestrin 20 mg-MCG she notes significant sexual dysfunction (likely due to Zoloft).  Possible vaginitis along with possible endometriosis.  2. Anxiety and depression Patient is on Zoloft 200 mg daily and Wellbutrin 100 mg daily.  She denies suicidal or homicidal ideations.  Assessment/Plan:   1. Pelvic pain Patient was referred to pelvic floor physical therapy.  She is to call to get scheduled with O[facility] at [facility].  - Ambulatory referral to Physical Therapy  2. Anxiety and depression Patient will continue her current medications with no change in treatment.  May want to revisit seeing psych if medication change is necessary in the future.  3. BMI 29.0-29.9,adult  4. Obesity with starting BMI of 34.2 Dominique Davis is currently in the action stage of change. As such, her goal is to continue with weight loss efforts. She has agreed to the Category 3 Plan and keeping a food journal and adhering to recommended goals of 450-600 calories and 40+ grams of protein at supper daily.   Exercise goals: All adults should avoid inactivity. Some physical activity is better than none, and adults who participate in any amount of  physical activity gain some health benefits.  Patient is to make a plan for exercise for her next appointment.  Behavioral modification strategies: increasing lean protein intake, meal planning and cooking strategies, and keeping healthy foods in the home.  Raneem has agreed to follow-up with our clinic in 4 weeks. She was informed of the importance of frequent follow-up visits to maximize her success with intensive lifestyle modifications for her multiple health conditions.   Objective:   Blood pressure 112/75, pulse 84, temperature 98.2 F (36.8 C), height 5\' 7"  (1.702 m), weight 188 lb (85.3 kg), SpO2 100%. Body mass index is 29.44 kg/m.  General: Cooperative, alert, well developed, in no acute distress. HEENT: Conjunctivae and lids unremarkable. Cardiovascular: Regular rhythm.  Lungs: Normal work of breathing. Neurologic: No focal deficits.   Lab Results  Component Value Date   CREATININE 0.99 11/23/2022   BUN 20 11/23/2022   NA 138 11/23/2022   K 4.2 11/23/2022   CL 102 11/23/2022   CO2 23 11/23/2022   Lab Results  Component Value Date   ALT 10 11/23/2022   AST 13 11/23/2022   ALKPHOS 32 (L) 11/23/2022   BILITOT 0.2 11/23/2022   Lab Results  Component Value Date   HGBA1C 5.2 06/20/2022   HGBA1C 5.3 03/29/2022   HGBA1C 5.0 09/01/2020   HGBA1C 5.3 12/23/2015   HGBA1C 5.3 03/11/2013   Lab Results  Component Value Date   INSULIN 5.2 08/03/2022   Lab Results  Component Value Date   TSH 2.010  08/03/2022   Lab Results  Component Value Date   CHOL 175 11/23/2022   HDL 59 11/23/2022   LDLCALC 102 (H) 11/23/2022   TRIG 73 11/23/2022   CHOLHDL 3.0 11/23/2022   Lab Results  Component Value Date   VD25OH 69.2 11/23/2022   VD25OH 30.1 08/03/2022   Lab Results  Component Value Date   WBC 6.3 08/03/2022   HGB 13.7 08/03/2022   HCT 42.0 08/03/2022   MCV 85 08/03/2022   PLT 246 08/03/2022   No results found for: "IRON", "TIBC", "FERRITIN"  Attestation  Statements:   Reviewed by clinician on day of visit: allergies, medications, problem list, medical history, surgical history, family history, social history, and previous encounter notes.   I, Burt Knack, am acting as transcriptionist for Reuben Likes, MD.  I have reviewed the above documentation for accuracy and completeness, and I agree with the above. - Reuben Likes, MD

## 2023-02-09 ENCOUNTER — Other Ambulatory Visit: Payer: Self-pay

## 2023-02-09 ENCOUNTER — Ambulatory Visit: Payer: 59 | Attending: Family Medicine

## 2023-02-09 DIAGNOSIS — M5459 Other low back pain: Secondary | ICD-10-CM

## 2023-02-09 DIAGNOSIS — M25552 Pain in left hip: Secondary | ICD-10-CM | POA: Diagnosis present

## 2023-02-09 DIAGNOSIS — M62838 Other muscle spasm: Secondary | ICD-10-CM

## 2023-02-09 DIAGNOSIS — M6281 Muscle weakness (generalized): Secondary | ICD-10-CM

## 2023-02-09 DIAGNOSIS — N941 Unspecified dyspareunia: Secondary | ICD-10-CM

## 2023-02-09 DIAGNOSIS — R351 Nocturia: Secondary | ICD-10-CM

## 2023-02-09 DIAGNOSIS — R102 Pelvic and perineal pain unspecified side: Secondary | ICD-10-CM

## 2023-02-09 DIAGNOSIS — R279 Unspecified lack of coordination: Secondary | ICD-10-CM
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
## 2023-02-09 NOTE — Patient Instructions (Addendum)
Squatty potty: When your knees are level or below the level of your hips, pelvic floor muscles are pressed against rectum, preventing ease of bowel movement. By getting knees above the level of the hips, these pelvic floor muscles relax, allowing easier passage of bowel movement. ? Ways to get knees above hips: o Squatty Potty (7inch and 9inch versions) o Small stool o Roll of toilet paper under each foot o Hardback book or stack of magazines under each foot  Relaxed Toileting mechanics: Once in this position, make sure to lean forward with forearms on thighs, wide knees, relaxed stomach, and breathe.   Balloon Breathing   Standing, lift left thigh and press hand into it while squeezing Rt buttock - hold 5 seconds and repeat 5x. Follow that by sitting and squeezing in on hands for 5 seconds and then pressing out on hands for 5 seconds. Repeat both of these 3 times.

## 2023-02-13 ENCOUNTER — Ambulatory Visit: Payer: 59

## 2023-02-13 DIAGNOSIS — M6281 Muscle weakness (generalized): Secondary | ICD-10-CM

## 2023-02-13 DIAGNOSIS — R102 Pelvic and perineal pain unspecified side: Secondary | ICD-10-CM

## 2023-02-13 DIAGNOSIS — M25552 Pain in left hip: Secondary | ICD-10-CM

## 2023-02-13 DIAGNOSIS — M62838 Other muscle spasm: Secondary | ICD-10-CM

## 2023-02-13 DIAGNOSIS — M5459 Other low back pain: Secondary | ICD-10-CM

## 2023-02-13 DIAGNOSIS — R351 Nocturia: Secondary | ICD-10-CM

## 2023-02-13 DIAGNOSIS — N941 Unspecified dyspareunia: Secondary | ICD-10-CM

## 2023-02-13 DIAGNOSIS — R279 Unspecified lack of coordination: Secondary | ICD-10-CM

## 2023-02-13 NOTE — Patient Instructions (Signed)
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
## 2023-02-16 ENCOUNTER — Encounter: Payer: Self-pay | Admitting: Obstetrics & Gynecology

## 2023-02-16 ENCOUNTER — Ambulatory Visit: Payer: 59 | Admitting: Obstetrics & Gynecology

## 2023-02-16 VITALS — BP 117/78 | HR 85 | Ht 67.0 in | Wt 193.8 lb

## 2023-02-16 DIAGNOSIS — Z3041 Encounter for surveillance of contraceptive pills: Secondary | ICD-10-CM

## 2023-02-16 DIAGNOSIS — N946 Dysmenorrhea, unspecified: Secondary | ICD-10-CM

## 2023-02-16 DIAGNOSIS — N941 Unspecified dyspareunia: Secondary | ICD-10-CM | POA: Diagnosis not present

## 2023-02-16 DIAGNOSIS — R102 Pelvic and perineal pain: Secondary | ICD-10-CM

## 2023-02-16 DIAGNOSIS — N809 Endometriosis, unspecified: Secondary | ICD-10-CM | POA: Diagnosis not present

## 2023-02-16 MED ORDER — NORETHINDRONE ACET-ETHINYL EST 1-20 MG-MCG PO TABS: ORAL_TABLET | ORAL | 4 refills | Status: DC

## 2023-02-16 MED ORDER — ORILISSA 150 MG PO TABS
1.0000 | ORAL_TABLET | Freq: Every day | ORAL | 11 refills | Status: AC
Start: 2023-02-16 — End: 2023-03-18

## 2023-02-16 NOTE — Progress Notes (Signed)
GYN VISIT Patient name: ANARA MARSTON MRN [id number]  Date of birth: 12-Nov-1985 Chief Complaint:   Pelvic Pain (Left ovary pain)  History of Present Illness:   Dominique Davis is a 37 y.o. G0P0000 female being seen today for the following concerns:  -Since she has lost a significant amount of weight she has started to note a change in her period.  Previously menses were irregular and unpredictable due to PCOS.  She has now had 2 period "regularly" in the past 2 mos.  Menses will last for 5-6 days, using pad every several hours.  Notes significant dysmenorrhea- tried OTC pain meds with no improvement.  She also notes pelvic pain spontaneously as well as dyspareunia.  Worsening pain with deep insertion and may last for a few days.  Does not desire a pregnancy in the future.  Pt in the process of pelvic floor therapy.  Has been on OCP for many years.     No LMP recorded. (Menstrual status: Irregular Periods).    Review of Systems:   Pertinent items are noted in HPI Denies fever/chills, dizziness, headaches, visual disturbances, fatigue, shortness of breath, chest pain, abdominal pain, vomiting. Pertinent History Reviewed:   Past Surgical History:  Procedure Laterality Date   BACTERIAL OVERGROWTH TEST N/A 01/03/2014   CANCELLED   BACTERIAL OVERGROWTH TEST N/A 01/10/2014   0 PPM AT START, 65 PPM(75 MINS), 58 PPM(105 MINS)   WISDOM TOOTH EXTRACTION     x2    Past Medical History:  Diagnosis Date   Abdominal pain 05/10/2019   Anxiety    Back pain    Bloating 05/10/2019   BMI 29.0-29.9,adult 08/30/2011   Depression    Depression screen 08/03/2022   Dysmenorrhea    Edema    Encounter for insertion of mirena IUD 03/01/2016   03/01/16    High cholesterol    Joint pain    Loss of weight 11/21/2013   Migraines    Obesity    Other fatigue 08/03/2022   Pre-diabetes    Small intestinal bacterial overgrowth 01/2014   CIP/FLAG X7 DAYS(2X 2015)   Reviewed problem list,  medications and allergies. Physical Assessment:   Vitals:   02/16/23 1459  BP: 117/78  Pulse: 85  Weight: 193 lb 12.8 oz (87.9 kg)  Height: 5\' 7"  (1.702 m)  Body mass index is 30.35 kg/m.       Physical Examination:   General appearance: alert, well appearing, and in no distress  Psych: mood appropriate, normal affect  Skin: warm & dry   Cardiovascular: normal heart rate noted  Respiratory: normal respiratory effort, no distress  Abdomen: soft, non-tender, no rebound, no guarding  Pelvic: VULVA: normal appearing vulva with no masses, tenderness or lesions, VAGINA: normal appearing vagina with normal color and discharge, no lesions, some tenderness noted with insertion of speculum CERVIX: normal appearing cervix without discharge or lesions, UTERUS: uterus is normal size, shape, consistency and nontender  Extremities: no edema   Chaperone: Faith Rogue    Assessment & Plan:  1) suspected endometriosis-pelvic pain, dysmenorrhea, dyspareunia -Prior ultrasound 2019 reviewed no abnormalities noted -Based on history suspect endometriosis.  Reviewed definition and standard diagnosis as well as treatment -Discussed that goal is to help improve QOL -Reviewed management options including continuous pill, trial of alternative pill, Orilissa, LARC (patient tried IUD noted considerable pelvic pain) -Also discussed surgical interventions including diagnostic laparoscopy versus hysterectomy -While she does not desire a pregnancy, she is not sure if she is  ready for permanent surgical intervention -After much discussion, patient wishes for trial of continuous pill and Orilissa -Follow-up in 3 to 4 months  Meds ordered this encounter  Medications   Elagolix Sodium (ORILISSA) 150 MG TABS    Sig: Take 1 tablet (150 mg total) by mouth daily.    Dispense:  30 tablet    Refill:  11   norethindrone-ethinyl estradiol (JUNEL 1/20) 1-20 MG-MCG tablet    Sig: Take one tablet daily-skipping placebo     Dispense:  90 tablet    Refill:  4     Return in about 4 months (around 06/18/2023) for Medication follow up, with Dr. Charlotta Newton.   Myna Hidalgo, DO Attending Obstetrician & Gynecologist, [facility] for [employer], [facility]

## 2023-02-17 ENCOUNTER — Other Ambulatory Visit: Payer: Self-pay | Admitting: Family

## 2023-02-17 DIAGNOSIS — Z3041 Encounter for surveillance of contraceptive pills: Secondary | ICD-10-CM

## 2023-02-18 ENCOUNTER — Encounter: Payer: Self-pay | Admitting: Obstetrics & Gynecology

## 2023-02-20 ENCOUNTER — Other Ambulatory Visit: Payer: Self-pay | Admitting: Family

## 2023-02-20 ENCOUNTER — Other Ambulatory Visit: Payer: Self-pay | Admitting: *Deleted

## 2023-02-20 DIAGNOSIS — Z3041 Encounter for surveillance of contraceptive pills: Secondary | ICD-10-CM

## 2023-02-20 DIAGNOSIS — F5102 Adjustment insomnia: Secondary | ICD-10-CM

## 2023-02-20 MED ORDER — NORETHINDRONE ACET-ETHINYL EST 1-20 MG-MCG PO TABS: ORAL_TABLET | ORAL | 4 refills | Status: DC

## 2023-02-22 ENCOUNTER — Ambulatory Visit: Payer: 59

## 2023-02-22 DIAGNOSIS — R351 Nocturia: Secondary | ICD-10-CM

## 2023-02-22 DIAGNOSIS — R102 Pelvic and perineal pain unspecified side: Secondary | ICD-10-CM

## 2023-02-22 DIAGNOSIS — M25552 Pain in left hip: Secondary | ICD-10-CM

## 2023-02-22 DIAGNOSIS — N941 Unspecified dyspareunia: Secondary | ICD-10-CM

## 2023-02-22 DIAGNOSIS — M5459 Other low back pain: Secondary | ICD-10-CM

## 2023-02-22 DIAGNOSIS — M6281 Muscle weakness (generalized): Secondary | ICD-10-CM

## 2023-02-22 DIAGNOSIS — R279 Unspecified lack of coordination: Secondary | ICD-10-CM

## 2023-02-22 DIAGNOSIS — M62838 Other muscle spasm: Secondary | ICD-10-CM
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
## 2023-03-07 ENCOUNTER — Ambulatory Visit: Payer: 59

## 2023-03-09 ENCOUNTER — Encounter (INDEPENDENT_AMBULATORY_CARE_PROVIDER_SITE_OTHER): Payer: Self-pay | Admitting: Family Medicine

## 2023-03-09 ENCOUNTER — Ambulatory Visit (INDEPENDENT_AMBULATORY_CARE_PROVIDER_SITE_OTHER): Payer: 59 | Admitting: Family Medicine

## 2023-03-09 VITALS — BP 104/69 | HR 83 | Temp 98.2°F | Ht 67.0 in | Wt 189.0 lb

## 2023-03-09 DIAGNOSIS — Z6829 Body mass index (BMI) 29.0-29.9, adult: Secondary | ICD-10-CM

## 2023-03-09 DIAGNOSIS — E7849 Other hyperlipidemia: Secondary | ICD-10-CM

## 2023-03-09 DIAGNOSIS — F419 Anxiety disorder, unspecified: Secondary | ICD-10-CM

## 2023-03-09 DIAGNOSIS — E669 Obesity, unspecified: Secondary | ICD-10-CM | POA: Diagnosis not present

## 2023-03-09 DIAGNOSIS — E66811 Obesity, class 1: Secondary | ICD-10-CM

## 2023-03-09 DIAGNOSIS — F32A Depression, unspecified: Secondary | ICD-10-CM

## 2023-03-09 MED ORDER — BUPROPION HCL ER (XL) 150 MG PO TB24
150.0000 mg | ORAL_TABLET | Freq: Every day | ORAL | 0 refills | Status: DC
Start: 2023-03-09 — End: 2023-04-06

## 2023-03-09 NOTE — Progress Notes (Signed)
Chief Complaint:   OBESITY Dominique Davis is here to discuss her progress with her obesity treatment plan along with follow-up of her obesity related diagnoses. Dominique Davis is on the Category 3 Plan and keeping a food journal and adhering to recommended goals of 450-600 calories and 40+ grams of protein at supper and states she is following her eating plan approximately 60% of the time. Dominique Davis states she is walking, lifting light weights, and exercise videos for 30-60 minutes 2-3 times per week.  Today's visit was #: 8 Starting weight: 206 lbs Starting date: 08/03/2022 Today's weight: 189 lbs Today's date: 03/09/2023 Total lbs lost to date: 17 Total lbs lost since last in-office visit: 0  Interim History: Patient saw Gyn and was started on [place].  She is experiencing lower abdominal pain and started pelvic floor PT.  She is feeling like she is heading in the right direction.  Foodwise she has not been as on plan as she could have been.  Sometimes the pain is so significant that she cannot eat.  She does still skip meals and sometimes feel nauseous due to her pain.  She is trying to be mindful of food choices. She feels good on the ozempic and doesn't feel like the 0.5mg  is too much.   Subjective:   1. Other hyperlipidemia Patient is not on medications.  Her last LDL was significantly better controlled.  2. Anxiety and depression Patient denies suicidal or homicidal ideations on Wellbutrin and Zoloft.  She is now taking 150 mg of Zoloft and 100 mg of Wellbutrin.  Assessment/Plan:   1. Other hyperlipidemia Patient will continue with her category 3 meal plan.  2. Anxiety and depression Patient agreed to change Wellbutrin XL to 150 mg once daily, and we will refill for 1 month; she will continue Zoloft at 150 mg.  - buPROPion (WELLBUTRIN XL) 150 MG 24 hr tablet; Take 1 tablet (150 mg total) by mouth daily.  Dispense: 30 tablet; Refill: 0  3. BMI 29.0-29.9,adult  4. Obesity with  starting BMI of 34.2 Dominique Davis is currently in the action stage of change. As such, her goal is to continue with weight loss efforts. She has agreed to the Category 3 Plan and keeping a food journal and adhering to recommended goals of 450-600 calories and 40+ grams of protein at supper daily.   Exercise goals: All adults should avoid inactivity. Some physical activity is better than none, and adults who participate in any amount of physical activity gain some health benefits.  Behavioral modification strategies: increasing lean protein intake, meal planning and cooking strategies, keeping healthy foods in the home, and planning for success.  Dominique Davis has agreed to follow-up with our clinic in 4 weeks. She was informed of the importance of frequent follow-up visits to maximize her success with intensive lifestyle modifications for her multiple health conditions.   Objective:   Blood pressure 104/69, pulse 83, temperature 98.2 F (36.8 C), height 5\' 7"  (1.702 m), weight 189 lb (85.7 kg), SpO2 98%. Body mass index is 29.6 kg/m.  General: Cooperative, alert, well developed, in no acute distress. HEENT: Conjunctivae and lids unremarkable. Cardiovascular: Regular rhythm.  Lungs: Normal work of breathing. Neurologic: No focal deficits.   Lab Results  Component Value Date   CREATININE 0.99 11/23/2022   BUN 20 11/23/2022   NA 138 11/23/2022   K 4.2 11/23/2022   CL 102 11/23/2022   CO2 23 11/23/2022   Lab Results  Component Value Date   ALT  10 11/23/2022   AST 13 11/23/2022   ALKPHOS 32 (L) 11/23/2022   BILITOT 0.2 11/23/2022   Lab Results  Component Value Date   HGBA1C 5.2 06/20/2022   HGBA1C 5.3 03/29/2022   HGBA1C 5.0 09/01/2020   HGBA1C 5.3 12/23/2015   HGBA1C 5.3 03/11/2013   Lab Results  Component Value Date   INSULIN 5.2 08/03/2022   Lab Results  Component Value Date   TSH 2.010 08/03/2022   Lab Results  Component Value Date   CHOL 175 11/23/2022   HDL 59  11/23/2022   LDLCALC 102 (H) 11/23/2022   TRIG 73 11/23/2022   CHOLHDL 3.0 11/23/2022   Lab Results  Component Value Date   VD25OH 69.2 11/23/2022   VD25OH 30.1 08/03/2022   Lab Results  Component Value Date   WBC 6.3 08/03/2022   HGB 13.7 08/03/2022   HCT 42.0 08/03/2022   MCV 85 08/03/2022   PLT 246 08/03/2022   No results found for: "IRON", "TIBC", "FERRITIN"  Attestation Statements:   Reviewed by clinician on day of visit: allergies, medications, problem list, medical history, surgical history, family history, social history, and previous encounter notes.   I, Burt Knack, am acting as transcriptionist for Reuben Likes, MD.  I have reviewed the above documentation for accuracy and completeness, and I agree with the above. - Reuben Likes, MD

## 2023-03-13 ENCOUNTER — Ambulatory Visit: Payer: 59 | Attending: Family Medicine

## 2023-03-13 DIAGNOSIS — M62838 Other muscle spasm: Secondary | ICD-10-CM | POA: Diagnosis present

## 2023-03-13 DIAGNOSIS — M5459 Other low back pain: Secondary | ICD-10-CM | POA: Insufficient documentation

## 2023-03-13 DIAGNOSIS — M25552 Pain in left hip: Secondary | ICD-10-CM | POA: Diagnosis present

## 2023-03-13 DIAGNOSIS — R102 Pelvic and perineal pain: Secondary | ICD-10-CM | POA: Diagnosis present

## 2023-03-13 DIAGNOSIS — R279 Unspecified lack of coordination: Secondary | ICD-10-CM | POA: Insufficient documentation

## 2023-03-13 DIAGNOSIS — M6281 Muscle weakness (generalized): Secondary | ICD-10-CM | POA: Insufficient documentation
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
## 2023-03-21 ENCOUNTER — Ambulatory Visit: Payer: 59

## 2023-03-21 DIAGNOSIS — R102 Pelvic and perineal pain: Secondary | ICD-10-CM | POA: Diagnosis not present

## 2023-03-21 DIAGNOSIS — M62838 Other muscle spasm: Secondary | ICD-10-CM

## 2023-03-21 DIAGNOSIS — M25552 Pain in left hip: Secondary | ICD-10-CM

## 2023-03-21 DIAGNOSIS — R279 Unspecified lack of coordination: Secondary | ICD-10-CM

## 2023-03-21 DIAGNOSIS — M5459 Other low back pain: Secondary | ICD-10-CM

## 2023-03-21 DIAGNOSIS — M6281 Muscle weakness (generalized): Secondary | ICD-10-CM
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
## 2023-03-22 ENCOUNTER — Other Ambulatory Visit (INDEPENDENT_AMBULATORY_CARE_PROVIDER_SITE_OTHER): Payer: Self-pay | Admitting: Family Medicine

## 2023-03-22 DIAGNOSIS — F32A Depression, unspecified: Secondary | ICD-10-CM

## 2023-03-23 ENCOUNTER — Encounter: Payer: Self-pay | Admitting: Family

## 2023-03-26 DIAGNOSIS — Z0279 Encounter for issue of other medical certificate: Secondary | ICD-10-CM

## 2023-03-28 ENCOUNTER — Ambulatory Visit: Payer: 59

## 2023-03-28 DIAGNOSIS — R102 Pelvic and perineal pain: Secondary | ICD-10-CM

## 2023-03-28 DIAGNOSIS — M5459 Other low back pain: Secondary | ICD-10-CM

## 2023-03-28 DIAGNOSIS — M25552 Pain in left hip: Secondary | ICD-10-CM

## 2023-03-28 DIAGNOSIS — M62838 Other muscle spasm: Secondary | ICD-10-CM

## 2023-03-28 DIAGNOSIS — R279 Unspecified lack of coordination: Secondary | ICD-10-CM

## 2023-03-28 DIAGNOSIS — M6281 Muscle weakness (generalized): Secondary | ICD-10-CM
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
## 2023-04-03 ENCOUNTER — Ambulatory Visit: Payer: 59

## 2023-04-03 DIAGNOSIS — R279 Unspecified lack of coordination: Secondary | ICD-10-CM

## 2023-04-03 DIAGNOSIS — R102 Pelvic and perineal pain: Secondary | ICD-10-CM | POA: Diagnosis not present

## 2023-04-03 DIAGNOSIS — M6281 Muscle weakness (generalized): Secondary | ICD-10-CM

## 2023-04-03 DIAGNOSIS — M62838 Other muscle spasm: Secondary | ICD-10-CM

## 2023-04-03 DIAGNOSIS — M5459 Other low back pain: Secondary | ICD-10-CM
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
## 2023-04-06 ENCOUNTER — Ambulatory Visit (INDEPENDENT_AMBULATORY_CARE_PROVIDER_SITE_OTHER): Payer: 59 | Admitting: Family Medicine

## 2023-04-06 VITALS — BP 103/70 | HR 81 | Temp 98.4°F | Ht 67.0 in | Wt 188.0 lb

## 2023-04-06 DIAGNOSIS — Z6829 Body mass index (BMI) 29.0-29.9, adult: Secondary | ICD-10-CM

## 2023-04-06 DIAGNOSIS — E785 Hyperlipidemia, unspecified: Secondary | ICD-10-CM | POA: Diagnosis not present

## 2023-04-06 DIAGNOSIS — F419 Anxiety disorder, unspecified: Secondary | ICD-10-CM

## 2023-04-06 DIAGNOSIS — E669 Obesity, unspecified: Secondary | ICD-10-CM

## 2023-04-06 DIAGNOSIS — Z6834 Body mass index (BMI) 34.0-34.9, adult: Secondary | ICD-10-CM

## 2023-04-06 DIAGNOSIS — F32A Depression, unspecified: Secondary | ICD-10-CM

## 2023-04-06 MED ORDER — BUPROPION HCL ER (XL) 150 MG PO TB24
150.0000 mg | ORAL_TABLET | Freq: Every day | ORAL | 0 refills | Status: DC
Start: 2023-04-06 — End: 2023-05-11

## 2023-04-06 NOTE — Progress Notes (Signed)
[facility] MEDICAL WEIGHT [facility] HEALTHY WEIGHT & WELLNESS AT [place] [address] [place] [address] Dept: [phone] Dept Fax: [phone]  AT A GLANCE:  Vitals Temp: 98.4 F (36.9 C) BP: 103/70 Pulse Rate: 81 SpO2: 100 %   Anthropometric Measurements Height: 5\' 7"  (1.702 m) Weight: 188 lb (85.3 kg) BMI (Calculated): 29.44 Weight at Last Visit: 189 lb Weight Lost Since Last Visit: 1 Weight Gained Since Last Visit: 0 Starting Weight: 206 lb Total Weight Loss (lbs): 18 lb (8.165 kg)   Body Composition  Body Fat %: 36.2 % Fat Mass (lbs): 68.2 lbs Muscle Mass (lbs): 114 lbs Total Body Water (lbs): 85.6 lbs Visceral Fat Rating : 6   Other Clinical Data Today's Visit #: 9 Starting Date: 08/03/22     SUBJECTIVE: No chief complaint on file.   Dominique Davis is here to discuss her progress with her obesity treatment plan. She is on the Category 3 Plan and states she is following her eating plan approximately 70 % of the time. She states she is exercising 60 minutes 3-4 times per week.  OBJECTIVE: Visit Diagnoses: Problem List Items Addressed This Visit       Other   Mild hyperlipidemia - Primary    Patient last LDL minimally elevated and improved from prior lab.  Not on meds (wouldn't qualify given values, lack of other medical comorbidities and age). Needs labs in January 2025.       Anxiety and depression    Patient feeling better since adding Wellbutrin.  She is now down to 100mg  of Sertraline.  She is feeling better control of her symptoms with Wellbutrin.  Needs refill of the Wellbutrin today.  She has plans to discuss medication with her PCP at their next visit.      Relevant Medications   buPROPion (WELLBUTRIN XL) 150 MG 24 hr tablet    Interim History: Patient has been in quite a bit of pain and she thinks she over did her activity over the weekend and did get some dry needling in the last few days.  Her eating has been  intermittent due to increased pain and therefore she hasn't necessarily been able to get all food in but is close. She is always able to get breakfast in as well as lunch.  Dinner is still hit or miss.  She has switched lunch and dinner in order to more food in frequently. She is doing yogurt or string cheese or a glass of milk for snack calories.  In the next few weeks she has her brother's wedding which is local, she is traveling to [place] for training for work,    ASSESSMENT AND PLAN: Problem List Items Addressed This Visit       Other   Mild hyperlipidemia - Primary    Patient last LDL minimally elevated and improved from prior lab.  Not on meds (wouldn't qualify given values, lack of other medical comorbidities and age). Needs labs in January 2025.       Anxiety and depression    Patient feeling better since adding Wellbutrin.  She is now down to 100mg  of Sertraline.  She is feeling better control of her symptoms with Wellbutrin.  Needs refill of the Wellbutrin today.  She has plans to discuss medication with her PCP at their next visit.      Relevant Medications   buPROPion (WELLBUTRIN XL) 150 MG 24 hr tablet    Diet: Dominique Davis is currently in the action stage  of change. As such, her goal is to continue with weight loss efforts. She has agreed to Category 3 Plan.  She is interested in other food options particularly for breakfast. Calorie goal of 250-400 calories for breakfast with goal of 25+g of protein.   Exercise: Dominique Davis has been instructed that some exercise is better than none for weight loss and overall health benefits.   Behavior Modification:  We discussed the following Behavioral Modification Strategies today: increasing lean protein intake, decreasing simple carbohydrates, increasing vegetables, and holiday eating strategies.    No follow-ups on file.Marland Kitchen She was informed of the importance of frequent follow up visits to maximize her success with intensive lifestyle  modifications for her multiple health conditions.  Attestation Statements:   Reviewed by clinician on day of visit: allergies, medications, problem list, medical history, surgical history, family history, social history, and previous encounter notes.   Time spent on visit including pre-visit chart review and post-visit care and charting was 25 minutes.    Rudean Hitt, MD

## 2023-04-06 NOTE — Assessment & Plan Note (Signed)
Patient feeling better since adding Wellbutrin.  She is now down to 100mg  of Sertraline.  She is feeling better control of her symptoms with Wellbutrin.  Needs refill of the Wellbutrin today.  She has plans to discuss medication with her PCP at their next visit.

## 2023-04-06 NOTE — Assessment & Plan Note (Signed)
Patient last LDL minimally elevated and improved from prior lab.  Not on meds (wouldn't qualify given values, lack of other medical comorbidities and age). Needs labs in January 2025.

## 2023-04-10 ENCOUNTER — Encounter: Payer: Self-pay | Admitting: Family

## 2023-04-10 ENCOUNTER — Telehealth: Payer: Self-pay

## 2023-04-10 ENCOUNTER — Ambulatory Visit: Payer: 59 | Admitting: Family

## 2023-04-10 VITALS — BP 112/80 | HR 80 | Temp 98.0°F | Ht 67.0 in | Wt 187.0 lb

## 2023-04-10 DIAGNOSIS — Z6829 Body mass index (BMI) 29.0-29.9, adult: Secondary | ICD-10-CM

## 2023-04-10 DIAGNOSIS — N946 Dysmenorrhea, unspecified: Secondary | ICD-10-CM

## 2023-04-10 DIAGNOSIS — Z23 Encounter for immunization: Secondary | ICD-10-CM | POA: Diagnosis not present

## 2023-04-10 DIAGNOSIS — F331 Major depressive disorder, recurrent, moderate: Secondary | ICD-10-CM

## 2023-04-10 DIAGNOSIS — H04123 Dry eye syndrome of bilateral lacrimal glands: Secondary | ICD-10-CM

## 2023-04-10 DIAGNOSIS — E669 Obesity, unspecified: Secondary | ICD-10-CM

## 2023-04-10 DIAGNOSIS — F5102 Adjustment insomnia: Secondary | ICD-10-CM

## 2023-04-10 DIAGNOSIS — R635 Abnormal weight gain: Secondary | ICD-10-CM

## 2023-04-10 DIAGNOSIS — F4310 Post-traumatic stress disorder, unspecified: Secondary | ICD-10-CM

## 2023-04-10 MED ORDER — PRAZOSIN HCL 2 MG PO CAPS: 2.0000 mg | ORAL_CAPSULE | Freq: Every day | ORAL | 1 refills | Status: AC

## 2023-04-10 MED ORDER — RESTASIS 0.05 % OP EMUL: 1.0000 [drp] | Freq: Two times a day (BID) | OPHTHALMIC | 1 refills | Status: DC

## 2023-04-10 NOTE — Assessment & Plan Note (Signed)
chronic, stable taking Prazosin 2mg  qhs also hx of Depression, insomnia & Anxiety active in therapy refill sent f/u 6 mos

## 2023-04-10 NOTE — Assessment & Plan Note (Signed)
chronic, stable weaning off Zoloft (currently at 100mg ) and has been switched from Wellbutrin SR to XL per her Obesity provider.  She is still on Prazosin. -Continue current regimen and monitor for changes in mood. -F/U 6 mos or prn.

## 2023-04-10 NOTE — Progress Notes (Signed)
Patient ID: Dominique Davis, female    DOB: 07/18/1985, 37 y.o.   MRN: [id number]  Chief Complaint  Patient presents with   Abnormal weight gain   Dry Eye    Pt would like a medication refill of Restasis eye drops.    Discussed the use of AI scribe software for clinical note transcription with the patient, who gave verbal consent to proceed.  History of Present Illness   The patient, with a history of obesity and endometriosis, presents for a follow-up visit. She has been on a 0.5mg  dose of an Ozempic for weight loss and has seen a decrease in weight. She has been making dietary changes and has started exercising, although she overdid it during a recent physical therapy session. She is considering increasing the dose of her weight loss medication.  In addition to her weight loss efforts, the patient has seen GYN for her endometriosis. She reports that her gynecologist warned her that weight loss could trigger symptoms of endometriosis, which she has experienced. To manage this, she has been placed on continuous birth control and [place]. She is also undergoing pelvic floor therapy, which has been challenging due to the endometriosis.  The patient also reports a change in her psychiatric medications. She has been weaning off Zoloft, reducing her dose from 200mg  to 100mg , and has started on Wellbutrin XL. She reports feeling better since making this change.  Finally, the patient mentions that she has been struggling with dry eyes, which she manages with Restasis. She has tried wearing contacts, but found them very uncomfortable due to her dry eyes.     Abnormal wt gain:  HX: pt had been taking Saxenda 3mg  qd, ran out as her pharmacy did not have in stock, then heard from her ins that they are no longer covering as she gained a little weight while taking and did not lose the percentage needed to remain on medication. Pt is on 0.5mg  of  Ozempic, wt is stable.   Assessment & Plan:     Weight  Management - Patient is on 0.5mg  Ozempic and has been making dietary adjustments and exercising. Weight is decreasing. Discussed the possibility of increasing the dose of Ozempic. -Continue current regimen of 0.5mg  Ozempic. -Follow-up in 3 months or sooner if patient decides to increase Ozempic dose.  Depression/Anxiety - Patient is weaning off Zoloft (currently at 100mg ) and has been switched from Wellbutrin SR to XL per her Obesity provider.  She is still on Prazosin. -Refill sent for Prazosin. -Continue current regimen and monitor for changes in mood. -F/U 6 mos or prn.  Endometriosis/Dysmenorrhea - Patient reports increased pain due to endometriosis, which has been triggered by weight loss. Currently on continuous birth control [place]) and [place]. -Continue current regimen and monitor for changes in pain.  Dry Eye - Patient is using Restasis for severe dry eye. Reports discomfort with contact lenses due to dryness. -Refilling Restasis prescription.  Pelvic Floor Dysfunction - Patient is undergoing physical therapy. Reports some improvement. -Continue physical therapy.      Subjective:    Outpatient Medications Prior to Visit  Medication Sig Dispense Refill   buPROPion (WELLBUTRIN XL) 150 MG 24 hr tablet Take 1 tablet (150 mg total) by mouth daily. 90 tablet 0   EPINEPHrine 0.3 mg/0.3 mL IJ SOAJ injection INJECT 0.3 ML (0.3 MG) INTO THE MUSCLE ONCE AS NEEDED AS DIRECTED 1 each 1   fluticasone (FLONASE) 50 MCG/ACT nasal spray Place 2 sprays into both nostrils  daily. 16 g 6   Lemborexant (DAYVIGO) 5 MG TABS Take 1 tablet (5 mg total) by mouth at bedtime as needed (For sleep.). 30 tablet 1   norethindrone-ethinyl estradiol (JUNEL 1/20) 1-20 MG-MCG tablet Take one tablet daily-skipping placebo 90 tablet 4   ORILISSA 150 MG TABS Take by mouth.     prazosin (MINIPRESS) 2 MG capsule TAKE 1 CAPSULE BY MOUTH AT  BEDTIME TO HELP WITH SLEEP AND  PREVENT NIGHTMARES 90 capsule 3    Probiotic Product (CULTURELLE PROBIOTICS PO) Take by mouth daily.     RESTASIS 0.05 % ophthalmic emulsion      Semaglutide,0.25 or 0.5MG /DOS, (OZEMPIC, 0.25 OR 0.5 MG/DOSE,) 2 MG/3ML SOPN Inject 0.5 mg into the skin once a week. 9 mL 3   sertraline (ZOLOFT) 100 MG tablet TAKE 2 TABLETS BY MOUTH DAILY (Patient taking differently: Take 100 mg by mouth daily.) 180 tablet 3   sodium chloride (OCEAN) 0.65 % SOLN nasal spray Place 1 spray into both nostrils as needed for congestion. 88 mL 0   No facility-administered medications prior to visit.   Past Medical History:  Diagnosis Date   Abdominal pain 05/10/2019   Anxiety    Back pain    Bloating 05/10/2019   BMI 29.0-29.9,adult 08/30/2011   Depression    Depression screen 08/03/2022   Dysmenorrhea    Edema    Encounter for insertion of Mirena IUD 03/01/2016   03/01/16    High cholesterol    Joint pain    Loss of weight 11/21/2013   Migraines    Obesity    Other fatigue 08/03/2022   Pre-diabetes    Small intestinal bacterial overgrowth 01/2014   CIP/FLAG X7 DAYS(2X 2015)   Past Surgical History:  Procedure Laterality Date   BACTERIAL OVERGROWTH TEST N/A 01/03/2014   CANCELLED   BACTERIAL OVERGROWTH TEST N/A 01/10/2014   0 PPM AT START, 65 PPM(75 MINS), 58 PPM(105 MINS)   WISDOM TOOTH EXTRACTION     x2   Allergies  Allergen Reactions   Black Pepper [[place]] Rash      Objective:    Physical Exam Vitals and nursing note reviewed.  Constitutional:      Appearance: Normal appearance.  Cardiovascular:     Rate and Rhythm: Normal rate and regular rhythm.  Pulmonary:     Effort: Pulmonary effort is normal.     Breath sounds: Normal breath sounds.  Musculoskeletal:        General: Normal range of motion.  Skin:    General: Skin is warm and dry.  Neurological:     Mental Status: She is alert.  Psychiatric:        Mood and Affect: Mood normal.        Behavior: Behavior normal.   BP 112/80 (BP Location: Left Arm, Patient  Position: Sitting, Cuff Size: Large)   Pulse 80   Temp 98 F (36.7 C) (Temporal)   Ht 5\' 7"  (1.702 m)   Wt 187 lb (84.8 kg)   SpO2 98%   BMI 29.29 kg/m  Wt Readings from Last 3 Encounters:  04/10/23 187 lb (84.8 kg)  04/06/23 188 lb (85.3 kg)  03/09/23 189 lb (85.7 kg)      Dulce Sellar, NP

## 2023-04-10 NOTE — Assessment & Plan Note (Signed)
chronic, stable using Restasis eye drops sending refill f/u 6 mos

## 2023-04-10 NOTE — Assessment & Plan Note (Addendum)
chronic, stable taking Ozempic 0.5mg  qweek weight back down today still seeing [facility] wt is holding steady, continue to advise on wt loss strategies, pt wants to remain on 0.5mg  dose no refill needed today f/u 3 months

## 2023-04-10 NOTE — Telephone Encounter (Signed)
-----   Message from [place] sent at 04/10/2023  3:04 PM EST ----- Regarding: eye drops for the restasis eye drops, I forgot to ask if she uses the individual bullets each time or a reusable bottle?

## 2023-04-10 NOTE — Telephone Encounter (Signed)
I called pt and lvm in regards.

## 2023-04-11 ENCOUNTER — Ambulatory Visit: Payer: 59 | Attending: Family Medicine

## 2023-04-11 DIAGNOSIS — M62838 Other muscle spasm: Secondary | ICD-10-CM | POA: Diagnosis not present

## 2023-04-11 DIAGNOSIS — M6281 Muscle weakness (generalized): Secondary | ICD-10-CM | POA: Insufficient documentation

## 2023-04-11 DIAGNOSIS — R102 Pelvic and perineal pain: Secondary | ICD-10-CM | POA: Diagnosis present

## 2023-04-11 DIAGNOSIS — M25552 Pain in left hip: Secondary | ICD-10-CM | POA: Diagnosis not present

## 2023-04-11 DIAGNOSIS — M5459 Other low back pain: Secondary | ICD-10-CM | POA: Diagnosis present

## 2023-04-11 DIAGNOSIS — R279 Unspecified lack of coordination: Secondary | ICD-10-CM | POA: Insufficient documentation
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
## 2023-04-13 ENCOUNTER — Telehealth: Payer: Self-pay | Admitting: Family

## 2023-04-18 NOTE — Telephone Encounter (Signed)
Done & on your desk - let [name] know please!

## 2023-04-19 NOTE — Telephone Encounter (Signed)
not sure what she means? only one section that asks for the dates

## 2023-04-20 ENCOUNTER — Ambulatory Visit: Payer: 59

## 2023-04-20 DIAGNOSIS — M25552 Pain in left hip: Secondary | ICD-10-CM

## 2023-04-20 DIAGNOSIS — R279 Unspecified lack of coordination: Secondary | ICD-10-CM

## 2023-04-20 DIAGNOSIS — R102 Pelvic and perineal pain unspecified side: Secondary | ICD-10-CM

## 2023-04-20 DIAGNOSIS — M5459 Other low back pain: Secondary | ICD-10-CM

## 2023-04-20 DIAGNOSIS — M62838 Other muscle spasm: Secondary | ICD-10-CM

## 2023-04-20 DIAGNOSIS — M6281 Muscle weakness (generalized): Secondary | ICD-10-CM
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
## 2023-04-25 ENCOUNTER — Ambulatory Visit: Payer: 59

## 2023-04-25 DIAGNOSIS — M25552 Pain in left hip: Secondary | ICD-10-CM | POA: Diagnosis not present

## 2023-04-25 DIAGNOSIS — R102 Pelvic and perineal pain: Secondary | ICD-10-CM

## 2023-04-25 DIAGNOSIS — R279 Unspecified lack of coordination: Secondary | ICD-10-CM

## 2023-04-25 DIAGNOSIS — M6281 Muscle weakness (generalized): Secondary | ICD-10-CM

## 2023-04-25 DIAGNOSIS — M62838 Other muscle spasm: Secondary | ICD-10-CM

## 2023-04-25 DIAGNOSIS — M5459 Other low back pain: Secondary | ICD-10-CM

## 2023-04-25 NOTE — Patient Instructions (Signed)
To-Do List 04/25/23:  Increase water intake (if you can't, decrease fiber supplement)  Bowel retraining: 6oz of water an hour 8-12 grams of fiber per meal Within 30 minutes of each meal, go sit on the toilet for no more than 5 minutes (using squatty potty) No straining - exhale and blow like you're blowing up a tight balloon  3. Bowel massage every night    [facility] [address] [place], [place] [address] Phone # [phone] Fax [phone]
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
## 2023-05-11 ENCOUNTER — Ambulatory Visit (INDEPENDENT_AMBULATORY_CARE_PROVIDER_SITE_OTHER): Payer: 59 | Admitting: Family Medicine

## 2023-05-11 ENCOUNTER — Encounter (INDEPENDENT_AMBULATORY_CARE_PROVIDER_SITE_OTHER): Payer: Self-pay | Admitting: Family Medicine

## 2023-05-11 VITALS — BP 103/71 | HR 77 | Temp 98.3°F | Ht 67.0 in | Wt 189.0 lb

## 2023-05-11 DIAGNOSIS — F419 Anxiety disorder, unspecified: Secondary | ICD-10-CM

## 2023-05-11 DIAGNOSIS — E669 Obesity, unspecified: Secondary | ICD-10-CM | POA: Diagnosis not present

## 2023-05-11 DIAGNOSIS — F32A Depression, unspecified: Secondary | ICD-10-CM | POA: Diagnosis not present

## 2023-05-11 DIAGNOSIS — E785 Hyperlipidemia, unspecified: Secondary | ICD-10-CM

## 2023-05-11 DIAGNOSIS — Z6829 Body mass index (BMI) 29.0-29.9, adult: Secondary | ICD-10-CM

## 2023-05-11 DIAGNOSIS — E66811 Body mass index (BMI) 34.0-34.9, adult: Secondary | ICD-10-CM

## 2023-05-11 MED ORDER — BUPROPION HCL ER (XL) 150 MG PO TB24: 150.0000 mg | ORAL_TABLET | Freq: Every day | ORAL | 1 refills | Status: DC

## 2023-05-11 NOTE — Assessment & Plan Note (Signed)
Patient LDL improved on labs done in June.  She agreed to fast for next appointment for repeat labs.  She is not on any cholesterol altering medications or supplements.

## 2023-05-11 NOTE — Progress Notes (Signed)
SUBJECTIVE:  Chief Complaint: Obesity  Interim History: Patient had a good Thanksgiving and enjoyed herself.  She has been prioritizing water intake and breakfast.  She has found that if she gets her water in and eats a high protein breakfast she feels better.  GYN changed birth control pills and she was feeling down for a week or two. She had a period for a week and a half/ two weeks but now it has stopped.  For the next few weeks she is not planning on any events or celebrations.  She realizes that when she is cooking at home she is struggling with portioning out what is appropriate for her.   Dominique Davis is here to discuss her progress with her obesity treatment plan. She is on the Category 3 Plan and states she is following her eating plan approximately 65 % of the time. She states she is doing PT exercise 15-20 minutes for 7 days ago.   OBJECTIVE: Visit Diagnoses: Problem List Items Addressed This Visit       Other   Mild hyperlipidemia - Primary    Patient LDL improved on labs done in June.  She agreed to fast for next appointment for repeat labs.  She is not on any cholesterol altering medications or supplements.      Anxiety and depression    Patient feeling relatively good on combo of sertraline and wellbutrin.  She denies any suicidal or homicidal ideation.  She needs a refill of Wellbutrin today; sent in refill to [facility]      Relevant Medications   buPROPion (WELLBUTRIN XL) 150 MG 24 hr tablet    Vitals Temp: 98.3 F (36.8 C) BP: 103/71 Pulse Rate: 77 SpO2: 99 %   Anthropometric Measurements Height: 5\' 7"  (1.702 m) Weight: 189 lb (85.7 kg) BMI (Calculated): 29.59 Weight at Last Visit: 188 lb Weight Lost Since Last Visit: 0 Weight Gained Since Last Visit: 1 Starting Weight: 206 lb Total Weight Loss (lbs): 17 lb (7.711 kg)   Body Composition  Body Fat %: 36.6 % Fat Mass (lbs): 69.2 lbs Muscle Mass (lbs): 113.8 lbs Total Body Water (lbs): 85.6  lbs Visceral Fat Rating : 6   Other Clinical Data Today's Visit #: 10 Starting Date: 08/03/22     ASSESSMENT AND PLAN:  Diet: Dominique Davis is currently in the action stage of change. As such, her goal is to continue with weight loss efforts. She has agreed to keeping a food journal and adhering to recommended goals of 1450-1600 calories and 95 or more grams protein daily.  Patient to start food log or journaling meal plan.  The initial goal will be to habitually log or journal for at least 4 days a week.  The expectation it that patient may not initially meet calorie or protein goals as the nturitional understanding of food intake is begun.  We discussed the 10:1 ratio when reading a food label.  Patient agrees to keep a food log either electronically or on paper and bring to the next appointment to be able to dissect and discuss it with provider.    Exercise: Dominique Davis has been instructed that some exercise is better than none for weight loss and overall health benefits.   Behavior Modification:  We discussed the following Behavioral Modification Strategies today: increasing lean protein intake, increasing vegetables, meal planning and cooking strategies, planning for success, and keep a strict food journal.  No follow-ups on file.Marland Kitchen She was informed of the importance of frequent follow  up visits to maximize her success with intensive lifestyle modifications for her multiple health conditions.  Attestation Statements:   Reviewed by clinician on day of visit: allergies, medications, problem list, medical history, surgical history, family history, social history, and previous encounter notes.      Reuben Likes, MD

## 2023-05-11 NOTE — Assessment & Plan Note (Signed)
Patient feeling relatively good on combo of sertraline and wellbutrin.  She denies any suicidal or homicidal ideation.  She needs a refill of Wellbutrin today; sent in refill to [facility]

## 2023-05-17 ENCOUNTER — Ambulatory Visit: Payer: 59 | Attending: Family Medicine

## 2023-05-17 DIAGNOSIS — R102 Pelvic and perineal pain: Secondary | ICD-10-CM | POA: Diagnosis present

## 2023-05-17 DIAGNOSIS — R279 Unspecified lack of coordination: Secondary | ICD-10-CM | POA: Insufficient documentation

## 2023-05-17 DIAGNOSIS — M25552 Pain in left hip: Secondary | ICD-10-CM | POA: Diagnosis present

## 2023-05-17 DIAGNOSIS — M62838 Other muscle spasm: Secondary | ICD-10-CM | POA: Insufficient documentation

## 2023-05-17 DIAGNOSIS — M6281 Muscle weakness (generalized): Secondary | ICD-10-CM | POA: Diagnosis present

## 2023-05-17 DIAGNOSIS — M5459 Other low back pain: Secondary | ICD-10-CM | POA: Insufficient documentation
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
## 2023-06-10 ENCOUNTER — Other Ambulatory Visit: Payer: Self-pay | Admitting: Family

## 2023-06-10 DIAGNOSIS — E669 Obesity, unspecified: Secondary | ICD-10-CM

## 2023-06-10 DIAGNOSIS — R635 Abnormal weight gain: Secondary | ICD-10-CM

## 2023-06-20 ENCOUNTER — Encounter (INDEPENDENT_AMBULATORY_CARE_PROVIDER_SITE_OTHER): Payer: Self-pay | Admitting: Family Medicine

## 2023-06-20 ENCOUNTER — Ambulatory Visit (INDEPENDENT_AMBULATORY_CARE_PROVIDER_SITE_OTHER): Payer: 59 | Admitting: Family Medicine

## 2023-06-20 VITALS — BP 102/60 | HR 92 | Temp 98.2°F | Ht 67.0 in | Wt 188.0 lb

## 2023-06-20 DIAGNOSIS — E559 Vitamin D deficiency, unspecified: Secondary | ICD-10-CM

## 2023-06-20 DIAGNOSIS — E669 Obesity, unspecified: Secondary | ICD-10-CM

## 2023-06-20 DIAGNOSIS — R102 Pelvic and perineal pain: Secondary | ICD-10-CM | POA: Diagnosis not present

## 2023-06-20 DIAGNOSIS — E7849 Other hyperlipidemia: Secondary | ICD-10-CM | POA: Diagnosis not present

## 2023-06-20 DIAGNOSIS — E282 Polycystic ovarian syndrome: Secondary | ICD-10-CM

## 2023-06-20 DIAGNOSIS — Z6829 Body mass index (BMI) 29.0-29.9, adult: Secondary | ICD-10-CM

## 2023-06-20 DIAGNOSIS — F39 Unspecified mood [affective] disorder: Secondary | ICD-10-CM

## 2023-06-20 DIAGNOSIS — E785 Hyperlipidemia, unspecified: Secondary | ICD-10-CM

## 2023-06-20 NOTE — Progress Notes (Signed)
 SUBJECTIVE:  Chief Complaint: Obesity  Interim History: Patient had a low key holiday season- stayed home.  Dominique Davis has not done the best with her food intake and choices.  Most, if not all the indulgent, food is not in the house. Dominique Davis was eating out more and skipping meals as well as stress eating.  Dominique Davis is prepping for a trip in a few weeks and is still very nervous about flying.  Dominique Davis bought the subscription to [employer] and has been consistently logging. Before her trip doesn't have much going on.   Dominique Davis is here to discuss her progress with her obesity treatment plan. Dominique Davis is on the keeping a food journal and adhering to recommended goals of 1450-1600 calories and 95 grams of protein and states Dominique Davis is following her eating plan approximately 40 % of the time. Dominique Davis states Dominique Davis is exercising 30 minutes 2-3 times per week.   OBJECTIVE: Visit Diagnoses: Problem List Items Addressed This Visit       Endocrine   PCOS (polycystic ovarian syndrome)   Periods have become more regular.  Dominique Davis is on off label Ozempic  for insulin  resistance.  Dominique Davis was recently confirmed diagnosis of endometriosis patient is also on oral contraceptive pills and a new medication to help with her endometriosis.  States her periods have become significantly less painful.      Relevant Orders   Comprehensive metabolic panel (Completed)   Hemoglobin A1c (Completed)   Insulin , random (Completed)     Other   Mild hyperlipidemia   Last LDL 102 and was improved from labs prior to that.  Patient is not on lipid altering medications or supplements.  Dominique Davis needs repeat FLP today as Dominique Davis is fasting.      Mood disorder (HCC)   Has felt markedly better with increasing Wellbutrin  and decreasing sertraline .  Dominique Davis also feels better with her endometriosis being diagnosed and currently being treated.  Her boyfriend continues to be supportive.  Dominique Davis denies suicidal and homicidal ideation.  Dominique Davis was encouraged to continue counseling as well  as her current medications      Vitamin D  deficiency   Last time vitamin D  was checked patient was at goal with a level of 60.  Dominique Davis was changed from prescription strength vitamin D  to over-the-counter supplementation at 2000 international units daily.  We will recheck her vitamin D  level today.      Relevant Orders   VITAMIN D  25 Hydroxy (Vit-D Deficiency, Fractures) (Completed)   Other Visit Diagnoses       Other hyperlipidemia    -  Primary   Relevant Orders   Lipid Panel With LDL/HDL Ratio (Completed)     Pelvic pain         BMI 29.0-29.9,adult         Obesity starting BMI 32.2           No data recorded No data recorded No data recorded No data recorded   ASSESSMENT AND PLAN:  Diet: Dominique Davis is currently in the action stage of change. As such, her goal is to continue with weight loss efforts. Dominique Davis has agreed to keeping a food journal and adhering to recommended goals of 1450-1600 calories and 95 or more grams of protein daily. Patient to start food log or journaling meal plan.  The initial goal will be to habitually log or journal for at least 4 days a week.  The expectation it that patient may not initially meet calorie or protein goals as the  nturitional understanding of food intake is begun.  We discussed the 10:1 ratio when reading a food label.  Patient agrees to keep a food log either electronically or on paper and bring to the next appointment to be able to dissect and discuss it with provider.    Exercise: Lamesha has been instructed that some exercise is better than none and to continue exercising as is for weight loss and overall health benefits.   Behavior Modification:  We discussed the following Behavioral Modification Strategies today: increasing lean protein intake, increasing vegetables, decreasing eating out, no skipping meals, meal planning and cooking strategies, and keeping healthy foods in the home.   No follow-ups on file.SABRA Dominique Davis was informed of the  importance of frequent follow up visits to maximize her success with intensive lifestyle modifications for her multiple health conditions.  Attestation Statements:   Reviewed by clinician on day of visit: allergies, medications, problem list, medical history, surgical history, family history, social history, and previous encounter notes.     Adelita Cho, MD

## 2023-06-21 LAB — COMPREHENSIVE METABOLIC PANEL
ALT: 8 [IU]/L (ref 0–32)
AST: 13 [IU]/L (ref 0–40)
Albumin: 4.2 g/dL (ref 3.9–4.9)
Alkaline Phosphatase: 36 [IU]/L — ABNORMAL LOW (ref 44–121)
BUN/Creatinine Ratio: 18 (ref 9–23)
BUN: 17 mg/dL (ref 6–20)
Bilirubin Total: 0.3 mg/dL (ref 0.0–1.2)
CO2: 24 mmol/L (ref 20–29)
Calcium: 9 mg/dL (ref 8.7–10.2)
Chloride: 101 mmol/L (ref 96–106)
Creatinine, Ser: 0.94 mg/dL (ref 0.57–1.00)
Globulin, Total: 2.5 g/dL (ref 1.5–4.5)
Glucose: 85 mg/dL (ref 70–99)
Potassium: 4.4 mmol/L (ref 3.5–5.2)
Sodium: 137 mmol/L (ref 134–144)
Total Protein: 6.7 g/dL (ref 6.0–8.5)
eGFR: 81 mL/min/{1.73_m2} (ref >59)

## 2023-06-21 LAB — VITAMIN D 25 HYDROXY (VIT D DEFICIENCY, FRACTURES): Vit D, 25-Hydroxy: 38.1 ng/mL (ref 30.0–100.0)

## 2023-06-21 LAB — LIPID PANEL WITH LDL/HDL RATIO
Cholesterol, Total: 199 mg/dL (ref 100–199)
HDL: 68 mg/dL (ref >39)
LDL Chol Calc (NIH): 114 mg/dL — ABNORMAL HIGH (ref 0–99)
LDL/HDL Ratio: 1.7 {ratio} (ref 0.0–3.2)
Triglycerides: 97 mg/dL (ref 0–149)
VLDL Cholesterol Cal: 17 mg/dL (ref 5–40)

## 2023-06-21 LAB — HEMOGLOBIN A1C
Est. average glucose Bld gHb Est-mCnc: 108 mg/dL
Hgb A1c MFr Bld: 5.4 % (ref 4.8–5.6)

## 2023-06-21 LAB — INSULIN, RANDOM: INSULIN: 5.1 u[IU]/mL (ref 2.6–24.9)

## 2023-06-25 NOTE — Assessment & Plan Note (Signed)
Last LDL 102 and was improved from labs prior to that.  Patient is not on lipid altering medications or supplements.  She needs repeat FLP today as she is fasting.

## 2023-06-25 NOTE — Assessment & Plan Note (Signed)
Periods have become more regular.  He is on off label Ozempic for insulin resistance.  She was recently confirmed diagnosis of endometriosis patient is also on oral contraceptive pills and a new medication to help with her endometriosis.  States her periods have become significantly less painful.

## 2023-06-25 NOTE — Assessment & Plan Note (Signed)
Last time vitamin D was checked patient was at goal with a level of 60.  She was changed from prescription strength vitamin D to over-the-counter supplementation at 2000 international units daily.  We will recheck her vitamin D level today.

## 2023-06-25 NOTE — Assessment & Plan Note (Signed)
Has felt markedly better with increasing Wellbutrin and decreasing sertraline.  She also feels better with her endometriosis being diagnosed and currently being treated.  Her boyfriend continues to be supportive.  She denies suicidal and homicidal ideation.  She was encouraged to continue counseling as well as her current medications

## 2023-07-13 ENCOUNTER — Encounter: Payer: Self-pay | Admitting: Family

## 2023-07-13 ENCOUNTER — Ambulatory Visit: Payer: 59 | Admitting: Family

## 2023-07-13 VITALS — BP 104/70 | HR 88 | Temp 98.1°F | Ht 67.0 in | Wt 196.0 lb

## 2023-07-13 DIAGNOSIS — Z91018 Allergy to other foods: Secondary | ICD-10-CM | POA: Diagnosis not present

## 2023-07-13 DIAGNOSIS — Z9109 Other allergy status, other than to drugs and biological substances: Secondary | ICD-10-CM

## 2023-07-13 DIAGNOSIS — E669 Obesity, unspecified: Secondary | ICD-10-CM | POA: Diagnosis not present

## 2023-07-13 DIAGNOSIS — R635 Abnormal weight gain: Secondary | ICD-10-CM

## 2023-07-13 DIAGNOSIS — K5901 Slow transit constipation: Secondary | ICD-10-CM | POA: Diagnosis not present

## 2023-07-13 DIAGNOSIS — J3089 Other allergic rhinitis: Secondary | ICD-10-CM

## 2023-07-13 NOTE — Progress Notes (Signed)
 Patient ID: Dominique Davis, female    DOB: Dec 17, 1985, 38 y.o.   MRN: [id number]  Chief Complaint  Patient presents with   abnormal weight gain      Discussed the use of AI scribe software for clinical note transcription with the patient, who gave verbal consent to proceed.  History of Present Illness   Dominique Davis is a 38 year old female with fibromyalgia who presents with medication management and allergy  concerns.  She is managing her fibromyalgia with Ozempic , adjusting her dosage to 0.75 mg by using a combination of 0.5 mg and 0.25 mg doses. She feels slightly more hungry, attributing it to nerves, and experiences constipation, which she believes is due to insufficient water intake. No significant nausea or heartburn is present. She has not transitioned to the 1 mg dose and has several pens remaining, which she estimates will last a few months.  She experiences chronic pain, fatigue, and hives, which she attributes to potential allergies. She has not had allergy  testing since her twenties and is considering retesting due to changes in her symptoms. She reports frequent nasal drainage, sinus issues, and reactions to strong perfumes, which cause her throat to close slightly. She occasionally uses Claritin or Zyrtec but is unsure of their effectiveness.  She is considering genetic testing due to her fibromyalgia and other symptoms. She has returned to consuming gluten, which has exacerbated her joint pain and other symptoms such as hives and temperature regulation issues. She recalls feeling better when avoiding gluten and is contemplating eliminating it again. She has a history of SIBO and was previously evaluated by a GI specialist in her twenties.     Assessment & Plan:     Obesity - Patient has self-adjusted dose to 0.75mg  (0.5mg  + 0.25mg ) instead of increasing to 1mg  due to concerns about side effects. No significant side effects reported at current dose. -Continue current  self-adjusted dose of 0.75mg . -Consider increasing to 1mg  when current supply is depleted. has made dietary changes. -Continue current weight management plan with weight loss clinic. -F/U in 3 months  Chronic Constipation - Likely secondary to Ozempic  use and inadequate water intake. -Increase water intake to at least 2L qd. -Continue monitoring for changes in bowel habits, add fiber and/or generic stool softener/Miralax prn.  Allergies - Patient reports chronic rhinitis and occasional hives. Previous allergy  testing was done in her twenties. -Ordering food allergy  panel and environmental allergy  panel through [facility]. -Consider starting Flonase  or Nasacort for symptom control. -Ordering retesting for gluten allergy .     Subjective:    Outpatient Medications Prior to Visit  Medication Sig Dispense Refill   buPROPion  (WELLBUTRIN  XL) 150 MG 24 hr tablet Take 1 tablet (150 mg total) by mouth daily. 90 tablet 1   EPINEPHrine  0.3 mg/0.3 mL IJ SOAJ injection INJECT 0.3 ML (0.3 MG) INTO THE MUSCLE ONCE AS NEEDED AS DIRECTED 1 each 1   fluticasone  (FLONASE ) 50 MCG/ACT nasal spray Place 2 sprays into both nostrils daily. 16 g 6   norethindrone -ethinyl estradiol (JUNEL 1/20) 1-20 MG-MCG tablet Take one tablet daily-skipping placebo 90 tablet 4   ORILISSA  150 MG TABS Take by mouth.     OZEMPIC , 0.25 OR 0.5 MG/DOSE, 2 MG/3ML SOPN INJECT SUBCUTANEOUSLY 0.5 MG  EVERY WEEK AT 12 NOON 9 mL 3   prazosin  (MINIPRESS ) 2 MG capsule Take 1 capsule (2 mg total) by mouth at bedtime. 90 capsule 1   Probiotic Product (CULTURELLE PROBIOTICS PO) Take by mouth daily.  sodium chloride (OCEAN) 0.65 % SOLN nasal spray Place 1 spray into both nostrils as needed for congestion. 88 mL 0   Lemborexant  (DAYVIGO ) 5 MG TABS Take 1 tablet (5 mg total) by mouth at bedtime as needed (For sleep.). (Patient not taking: Reported on 07/13/2023) 30 tablet 1   sertraline  (ZOLOFT ) 100 MG tablet TAKE 2 TABLETS BY MOUTH DAILY (Patient  not taking: Reported on 07/13/2023) 180 tablet 3   No facility-administered medications prior to visit.   Past Medical History:  Diagnosis Date   Abdominal pain 05/10/2019   Anxiety    Back pain    Bloating 05/10/2019   BMI 29.0-29.9,adult 08/30/2011   Depression    Depression screen 08/03/2022   Dysmenorrhea    Edema    Encounter for insertion of Mirena  IUD 03/01/2016   03/01/16    High cholesterol    Joint pain    Loss of weight 11/21/2013   Migraines    Obesity    Other fatigue 08/03/2022   Pre-diabetes    Small intestinal bacterial overgrowth 01/2014   CIP/FLAG X7 DAYS(2X 2015)   Past Surgical History:  Procedure Laterality Date   BACTERIAL OVERGROWTH TEST N/A 01/03/2014   CANCELLED   BACTERIAL OVERGROWTH TEST N/A 01/10/2014   0 PPM AT START, 65 PPM(75 MINS), 58 PPM(105 MINS)   WISDOM TOOTH EXTRACTION     x2   Allergies  Allergen Reactions   Black Pepper [Piper] Rash      Objective:    Physical Exam Vitals and nursing note reviewed.  Constitutional:      Appearance: Normal appearance.  Cardiovascular:     Rate and Rhythm: Normal rate and regular rhythm.  Pulmonary:     Effort: Pulmonary effort is normal.     Breath sounds: Normal breath sounds.  Musculoskeletal:        General: Normal range of motion.  Skin:    General: Skin is warm and dry.  Neurological:     Mental Status: She is alert.  Psychiatric:        Mood and Affect: Mood normal.        Behavior: Behavior normal.   BP 104/70 (BP Location: Left Arm, Patient Position: Sitting, Cuff Size: Large)   Pulse 88   Temp 98.1 F (36.7 C) (Temporal)   Ht 5' 7 (1.702 m)   Wt 196 lb (88.9 kg)   LMP 06/21/2023 (Approximate)   SpO2 99%   BMI 30.70 kg/m  Wt Readings from Last 3 Encounters:  07/13/23 196 lb (88.9 kg)  06/20/23 188 lb (85.3 kg)  05/11/23 189 lb (85.7 kg)      Dominique Krabbe, NP

## 2023-07-14 ENCOUNTER — Other Ambulatory Visit: Payer: Self-pay | Admitting: Medical Genetics

## 2023-07-15 LAB — FOOD ALLERGY PROFILE
Allergen Corn, IgE: <0.1 kU/L
Clam IgE: <0.1 kU/L
Codfish IgE: <0.1 kU/L
Egg White IgE: <0.1 kU/L
Milk IgE: <0.1 kU/L
Peanut IgE: <0.1 kU/L
Scallop IgE: <0.1 kU/L
Sesame Seed IgE: <0.1 kU/L
Shrimp IgE: <0.1 kU/L
Soybean IgE: <0.1 kU/L
Walnut IgE: <0.1 kU/L
Wheat IgE: <0.1 kU/L

## 2023-07-15 LAB — ALLERGEN PANEL (27) + IGE
Alternaria Alternata IgE: <0.1 kU/L
Aspergillus Fumigatus IgE: <0.1 kU/L
Bahia Grass IgE: <0.1 kU/L
Bermuda Grass IgE: <0.1 kU/L
Cat Dander IgE: <0.1 kU/L
Cedar, Mountain IgE: <0.1 kU/L
Cladosporium Herbarum IgE: <0.1 kU/L
Cocklebur IgE: <0.1 kU/L
Cockroach, American IgE: <0.1 kU/L
Common Silver Birch IgE: <0.1 kU/L
D Farinae IgE: <0.1 kU/L
D Pteronyssinus IgE: <0.1 kU/L
Dog Dander IgE: <0.1 kU/L
Elm, American IgE: <0.1 kU/L
Hickory, White IgE: <0.1 kU/L
IgE (Immunoglobulin E), Serum: 2 [IU]/mL — ABNORMAL LOW (ref 6–495)
Johnson Grass IgE: <0.1 kU/L
Kentucky Bluegrass IgE: <0.1 kU/L
Maple/Box Elder IgE: <0.1 kU/L
Mucor Racemosus IgE: <0.1 kU/L
Oak, White IgE: <0.1 kU/L
Penicillium Chrysogen IgE: <0.1 kU/L
Pigweed, Rough IgE: <0.1 kU/L
Plantain, English IgE: <0.1 kU/L
Ragweed, Short IgE: <0.1 kU/L
Setomelanomma Rostrat: <0.1 kU/L
Timothy Grass IgE: <0.1 kU/L
White Mulberry IgE: <0.1 kU/L

## 2023-07-17 ENCOUNTER — Other Ambulatory Visit (HOSPITAL_COMMUNITY)
Admission: RE | Admit: 2023-07-17 | Discharge: 2023-07-17 | Disposition: A | Discharge status: Home / Self Care | Payer: Self-pay | Source: Ambulatory Visit | Attending: Oncology | Admitting: Oncology

## 2023-07-17 LAB — ALLERGEN PROFILE, MOLD
Alternaria Alternata IgE: <0.1 kU/L
Aspergillus Fumigatus IgE: <0.1 kU/L
Aureobasidi Pullulans IgE: <0.1 kU/L
Candida Albicans IgE: <0.1 kU/L
Cladosporium Herbarum IgE: <0.1 kU/L
M009-IgE Fusarium proliferatum: <0.1 kU/L
M014-IgE Epicoccum purpur: <0.1 kU/L
Mucor Racemosus IgE: <0.1 kU/L
Penicillium Chrysogen IgE: <0.1 kU/L
Phoma Betae IgE: <0.1 kU/L
Setomelanomma Rostrat: <0.1 kU/L
Stemphylium Herbarum IgE: <0.1 kU/L

## 2023-07-17 LAB — GLIA (IGA/G) + TTG IGA
Antigliadin Abs, IgA: 6 U (ref 0–19)
Gliadin IgG: 2 U (ref 0–19)
Transglutaminase IgA: <2 U/mL (ref 0–3)

## 2023-07-18 ENCOUNTER — Ambulatory Visit (INDEPENDENT_AMBULATORY_CARE_PROVIDER_SITE_OTHER): Payer: 59 | Admitting: Family Medicine

## 2023-07-18 ENCOUNTER — Encounter: Payer: Self-pay | Admitting: Family

## 2023-07-18 ENCOUNTER — Encounter (INDEPENDENT_AMBULATORY_CARE_PROVIDER_SITE_OTHER): Payer: Self-pay | Admitting: Family Medicine

## 2023-07-18 VITALS — BP 117/75 | HR 72 | Temp 98.5°F | Ht 67.0 in | Wt 191.0 lb

## 2023-07-18 DIAGNOSIS — M545 Low back pain, unspecified: Secondary | ICD-10-CM

## 2023-07-18 DIAGNOSIS — J309 Allergic rhinitis, unspecified: Secondary | ICD-10-CM | POA: Insufficient documentation

## 2023-07-18 DIAGNOSIS — Z6830 Body mass index (BMI) 30.0-30.9, adult: Secondary | ICD-10-CM

## 2023-07-18 DIAGNOSIS — E559 Vitamin D deficiency, unspecified: Secondary | ICD-10-CM

## 2023-07-18 DIAGNOSIS — J3089 Other allergic rhinitis: Secondary | ICD-10-CM

## 2023-07-18 DIAGNOSIS — M249 Joint derangement, unspecified: Secondary | ICD-10-CM

## 2023-07-18 DIAGNOSIS — Z6834 Body mass index (BMI) 34.0-34.9, adult: Secondary | ICD-10-CM

## 2023-07-18 DIAGNOSIS — F39 Unspecified mood [affective] disorder: Secondary | ICD-10-CM

## 2023-07-18 DIAGNOSIS — E669 Obesity, unspecified: Secondary | ICD-10-CM | POA: Diagnosis not present

## 2023-07-18 DIAGNOSIS — M4150 Other secondary scoliosis, site unspecified: Secondary | ICD-10-CM

## 2023-07-18 DIAGNOSIS — L509 Urticaria, unspecified: Secondary | ICD-10-CM

## 2023-07-18 MED ORDER — VITAMIN D3 50 MCG (2000 UT) PO CAPS: 2000.0000 [IU] | ORAL_CAPSULE | Freq: Every day | ORAL | Status: AC

## 2023-07-18 NOTE — Assessment & Plan Note (Signed)
Likely secondary to Ozempic use and inadequate water intake. -Increase water intake to at least 2L qd. -Continue monitoring for changes in bowel habits, add fiber and/or generic stool softener/Miralax prn.

## 2023-07-18 NOTE — Progress Notes (Unsigned)
   SUBJECTIVE:  Chief Complaint: Obesity  Interim History: Patient had a good last month.  She went to a training in [place] and she mentions she ate off plan due to eating the food that was provided. Since coming back she has been trying to stay consistent with food intake.  She is is normally staying around 1600-1700 calories and getting adequate protein in.  Some days she is feeling more hungry at the calorie budget other days she feels stuffed.  She has started walking on her walking pad.   Bryant is here to discuss her progress with her obesity treatment plan. She is on the Category 3 Plan and states she is following her eating plan approximately 75 % of the time. She states she is exercising 30 minutes 2-3 times per week.   OBJECTIVE: Visit Diagnoses: Problem List Items Addressed This Visit   None   No data recorded Anthropometric Measurements Height: 5\' 7"  (1.702 m) Weight at Last Visit: 188 lb Starting Weight: 206 lb   No data recorded Other Clinical Data Today's Visit #: 12 Starting Date: 08/03/22     ASSESSMENT AND PLAN:  Diet: Dinna is currently in the action stage of change. As such, her goal is to continue with weight loss efforts. She has agreed to Category 3 Plan or 1450- 1600 calories with 95 or more grams of protein a day.  Patient to start food log or journaling meal plan.  The initial goal will be to habitually log or journal for at least 4 days a week.  The expectation it that patient may not initially meet calorie or protein goals as the nturitional understanding of food intake is begun.  We discussed the 10:1 ratio when reading a food label.  Patient agrees to keep a food log either electronically or on paper and bring to the next appointment to be able to dissect and discuss it with provider.    Exercise: [name] has been instructed that some exercise is better than none and to continue exercising as is for weight loss and overall health  benefits.   Behavior Modification:  We discussed the following Behavioral Modification Strategies today: increasing lean protein intake, increasing vegetables, meal planning and cooking strategies, planning for success, and keep a strict food journal.   No follow-ups on file.Marland Kitchen She was informed of the importance of frequent follow up visits to maximize her success with intensive lifestyle modifications for her multiple health conditions.  Attestation Statements:   Reviewed by clinician on day of visit: allergies, medications, problem list, medical history, surgical history, family history, social history, and previous encounter notes.     Ammie L Chestnutt, RMA

## 2023-07-18 NOTE — Assessment & Plan Note (Signed)
Patient reports chronic rhinitis and occasional hives. Previous allergy testing was done in her twenties. -Ordering food allergy panel and environmental allergy panel through [facility]. -Consider starting Flonase or Nasacort for symptom control. -Ordering retesting for gluten allergy.

## 2023-07-18 NOTE — Assessment & Plan Note (Signed)
Patient noticed a dip in her Vitamin D level on last lab.  She started OTC Vitamin D gummies to help with her vitamin d level.  Patient will send message of the amount of Vitamin D she is taking.

## 2023-07-18 NOTE — Assessment & Plan Note (Signed)
Patient on 0.75mg  dose of Ozempic.  She is doing reasonably in terms of her intake control.

## 2023-07-18 NOTE — Assessment & Plan Note (Signed)
Patient has self-adjusted dose to 0.75mg  (0.5mg  + 0.25mg ) instead of increasing to 1mg  due to concerns about side effects. No significant side effects reported at current dose. -Continue current self-adjusted dose of 0.75mg . -Consider increasing to 1mg  when current supply is depleted. has made dietary changes. -Continue current weight management plan with weight loss clinic. -F/U in 3 months

## 2023-07-20 ENCOUNTER — Ambulatory Visit: Payer: 59 | Admitting: Obstetrics & Gynecology

## 2023-07-20 ENCOUNTER — Encounter: Payer: Self-pay | Admitting: Obstetrics & Gynecology

## 2023-07-20 VITALS — BP 113/72 | HR 81 | Ht 67.0 in | Wt 196.8 lb

## 2023-07-20 DIAGNOSIS — R102 Pelvic and perineal pain: Secondary | ICD-10-CM

## 2023-07-20 DIAGNOSIS — N809 Endometriosis, unspecified: Secondary | ICD-10-CM

## 2023-07-20 DIAGNOSIS — N941 Unspecified dyspareunia: Secondary | ICD-10-CM

## 2023-07-20 NOTE — Assessment & Plan Note (Signed)
Patient is slightly solemn today.  She is feeling a bit overwhelmed and frustrated with her chronic pain.  She is also having recurrent pelvic pain and is dealing with some into her relationship issues with her boyfriend around her endometriosis.  She is looking to get further evaluated for why her joints are so achy.  She denies suicidal and homicidal ideation.  She voices she feels better off Zoloft and just on the Wellbutrin.  She is open to seeking sex therapy with her boyfriend and for herself.  She is consistently seeing her own therapist.  Will need to follow-up on symptoms at next appointment.

## 2023-07-20 NOTE — Progress Notes (Signed)
   GYN VISIT Patient name: Dominique Davis MRN [id number]  Date of birth: 10/23/1985 Chief Complaint:   Follow-up  History of Present Illness:   Dominique Davis is a 38 y.o. G0P0000 female being seen today for follow up regarding:  Pelvic pain-suspected endometriosis: Today she notes that she definitely doing better than before.  Taking pill regularly- taking continuous pill typically will skip a menses though currently has a period now.  Also on Orilissa and that seems to have made a huge difference.   Completed pelvic floor therapy and doing exercises on her own.  She is able to self-stimulate and have orgasm, but notes some discomfort.  She is very concerned aobut potential pain/harm with intercourse  If previous pain was 10/10, currently 3/10.  Additionally, she was transitioned from zoloft to wellbutrin and notes change in desire.  In the process of switching PCP.  Pt looking into diagnosis of Ehlers-Danlos due to hyperflexibility   Patient's last menstrual period was 06/21/2023 (approximate).    Review of Systems:   Pertinent items are noted in HPI Denies fever/chills, dizziness, headaches, visual disturbances, fatigue, shortness of breath, chest pain. Pertinent History Reviewed:   Past Surgical History:  Procedure Laterality Date   BACTERIAL OVERGROWTH TEST N/A 01/03/2014   CANCELLED   BACTERIAL OVERGROWTH TEST N/A 01/10/2014   0 PPM AT START, 65 PPM(75 MINS), 58 PPM(105 MINS)   WISDOM TOOTH EXTRACTION     x2    Past Medical History:  Diagnosis Date   Abdominal pain 05/10/2019   Anxiety    Back pain    Bloating 05/10/2019   BMI 29.0-29.9,adult 08/30/2011   Depression    Depression screen 08/03/2022   Dysmenorrhea    Edema    Encounter for insertion of Mirena IUD 03/01/2016   03/01/16    Food allergy 03/04/2015   High cholesterol    Joint pain    Loss of weight 11/21/2013   MDD (major depressive disorder) 10/05/2015   Migraines    Obesity    Other fatigue  08/03/2022   Pre-diabetes    Small intestinal bacterial overgrowth 01/2014   CIP/FLAG X7 DAYS(2X 2015)   SOBOE (shortness of breath on exertion) 08/03/2022   Reviewed problem list, medications and allergies. Physical Assessment:   Vitals:   07/20/23 1348  BP: 113/72  Pulse: 81  Weight: 196 lb 12.8 oz (89.3 kg)  Height: 5\' 7"  (1.702 m)  Body mass index is 30.82 kg/m.       Physical Examination:   General appearance: alert, well appearing, and in no distress  Psych: mood appropriate, normal affect  Skin: warm & dry   Cardiovascular: normal heart rate noted  Respiratory: normal respiratory effort, no distress  Abdomen: soft, non-tender, no rebound, no guarding  Pelvic: examination not indicated  Extremities: no edema   Chaperone: N/A    Assessment & Plan:  1) Pelvic pain, suspected endometriosis -doing better with current regimen, plan to continue -reviewed that Dewayne Hatch is only x 53yrs and then will re-evaluate management -ok to use NSAIDs prn -reviewed conservative options including nutritional change -also reviewed her concerns/questions regarding sex -f/u prn or 55yr   Return in about 1 year (around 07/19/2024) for Annual.   Myna Hidalgo, DO Attending Obstetrician & Gynecologist, [facility] [employer], [facility]

## 2023-07-28 LAB — GENECONNECT MOLECULAR SCREEN: Genetic Analysis Overall Interpretation: NEGATIVE

## 2023-08-03 ENCOUNTER — Encounter: Payer: Self-pay | Admitting: *Deleted

## 2023-08-15 ENCOUNTER — Ambulatory Visit (INDEPENDENT_AMBULATORY_CARE_PROVIDER_SITE_OTHER): Payer: 59 | Admitting: Family Medicine

## 2023-08-15 ENCOUNTER — Encounter (INDEPENDENT_AMBULATORY_CARE_PROVIDER_SITE_OTHER): Payer: Self-pay | Admitting: Family Medicine

## 2023-08-15 VITALS — BP 116/78 | HR 88 | Temp 98.1°F | Ht 67.0 in | Wt 188.0 lb

## 2023-08-15 DIAGNOSIS — F419 Anxiety disorder, unspecified: Secondary | ICD-10-CM

## 2023-08-15 DIAGNOSIS — Z6829 Body mass index (BMI) 29.0-29.9, adult: Secondary | ICD-10-CM

## 2023-08-15 DIAGNOSIS — F32A Depression, unspecified: Secondary | ICD-10-CM

## 2023-08-15 DIAGNOSIS — E66811 Obesity, class 1: Secondary | ICD-10-CM

## 2023-08-15 DIAGNOSIS — E669 Obesity, unspecified: Secondary | ICD-10-CM | POA: Diagnosis not present

## 2023-08-15 DIAGNOSIS — J3089 Other allergic rhinitis: Secondary | ICD-10-CM | POA: Diagnosis not present

## 2023-08-15 MED ORDER — BUPROPION HCL ER (XL) 150 MG PO TB24
150.0000 mg | ORAL_TABLET | Freq: Every day | ORAL | 1 refills | Status: DC
Start: 2023-08-15 — End: 2023-11-30

## 2023-08-15 NOTE — Assessment & Plan Note (Signed)
 Patient restarted on Sertraline due to emotional lability.  She is feeling better on sertraline 50mg  and wellbutrin 150mg  daily.  She is seeing a therapist.  Will continue current treatment with no changes.  Needs refill of wellbutrin.

## 2023-08-15 NOTE — Progress Notes (Signed)
 SUBJECTIVE:  Chief Complaint: Obesity  Interim History: Since last appointment patient voices that life has been somewhat rough.  She has been basically spotting since last appointment.  She did restart zoloft at 50mg .  Work is stressing her out and her endometriosis has been flaring. She has cut back on gluten and that has helped with some of her pain.  She is in more pain recently from her pelvis down toward her feet.  She mentiosn that she is going to see a rheumatologist at the end of the month.  Lately she has been not eating much due to waves of nausea and doing more snacks instead of meals.   Dominique Davis is here to discuss her progress with her obesity treatment plan. She is on the Category 3 and keeping a food journal and adhering to recommended goals of 1450-1600 calories and 95 grams of protein and states she is following her eating plan approximately 50 % of the time. She states she is walking the dogs.   OBJECTIVE: Visit Diagnoses: Problem List Items Addressed This Visit       Respiratory   Allergic rhinitis due to allergen   Workup of common allergy panel done last month.  Patient is trying to figure out if she is experiencing a reaction to something that is leading to her symptoms of fatigue and joint aches.        Other   Obesity   Anthropometric Measurements Height: 5\' 7"  (1.702 m) Weight: 188 lb (85.3 kg) BMI (Calculated): 29.44 Weight at Last Visit: 191 lb Weight Lost Since Last Visit: 3 Weight Gained Since Last Visit: 0 Starting Weight: 206 lb Total Weight Loss (lbs): 18 lb (8.165 kg) Body Composition  Body Fat %: 35.7 % Fat Mass (lbs): 67.2 lbs Muscle Mass (lbs): 114.8 lbs Total Body Water (lbs): 83.2 lbs Visceral Fat Rating : 6 Other Clinical Data Today's Visit #: 13 Starting Date: 08/03/22 Comments: 1450-1600/95       Anxiety and depression - Primary   Patient restarted on Sertraline due to emotional lability.  She is feeling better on sertraline  50mg  and wellbutrin 150mg  daily.  She is seeing a therapist.  Will continue current treatment with no changes.  Needs refill of wellbutrin.      Relevant Medications   sertraline (ZOLOFT) 50 MG tablet   buPROPion (WELLBUTRIN XL) 150 MG 24 hr tablet   Other Visit Diagnoses       BMI 29.0-29.9,adult           No data recorded       08/15/2023    1:00 PM 07/20/2023    1:48 PM 07/18/2023    2:00 PM  Vitals with BMI  Height 5\' 7"  5\' 7"  5\' 7"   Weight 188 lbs 196 lbs 13 oz 191 lbs  BMI 29.44 30.82 29.91  Systolic 116 113 098  Diastolic 78 72 75  Pulse 88 81 72      ASSESSMENT AND PLAN:  Diet: [name] is currently in the action stage of change. As such, her goal is to continue with weight loss efforts and has agreed to keeping a food journal and adhering to recommended goals of 1450-1600 calories and 95 or more grams of protein daily. Patient to continue food log or journaling meal plan.  The initial goal will be to habitually log or journal for at least 4 days a week.  The expectation it that patient may not initially meet calorie or protein goals as the nturitional  understanding of food intake is begun.  We discussed the 10:1 ratio when reading a food label.  Patient agrees to keep a food log either electronically or on paper and bring to the next appointment to be able to dissect and discuss it with provider.    Exercise:  For substantial health benefits, adults should do at least 150 minutes (2 hours and 30 minutes) a week of moderate-intensity, or 75 minutes (1 hour and 15 minutes) a week of vigorous-intensity aerobic physical activity, or an equivalent combination of moderate- and vigorous-intensity aerobic activity. Aerobic activity should be performed in episodes of at least 10 minutes, and preferably, it should be spread throughout the week.  Behavior Modification:  We discussed the following Behavioral Modification Strategies today: increasing lean protein intake,  increasing vegetables, no skipping meals, meal planning and cooking strategies, and planning for success.   No follow-ups on file.Marland Kitchen She was informed of the importance of frequent follow up visits to maximize her success with intensive lifestyle modifications for her multiple health conditions.  Attestation Statements:   Reviewed by clinician on day of visit: allergies, medications, problem list, medical history, surgical history, family history, social history, and previous encounter notes.   Reuben Likes, MD

## 2023-08-22 NOTE — Assessment & Plan Note (Signed)
 Anthropometric Measurements Height: 5\' 7"  (1.702 m) Weight: 188 lb (85.3 kg) BMI (Calculated): 29.44 Weight at Last Visit: 191 lb Weight Lost Since Last Visit: 3 Weight Gained Since Last Visit: 0 Starting Weight: 206 lb Total Weight Loss (lbs): 18 lb (8.165 kg) Body Composition  Body Fat %: 35.7 % Fat Mass (lbs): 67.2 lbs Muscle Mass (lbs): 114.8 lbs Total Body Water (lbs): 83.2 lbs Visceral Fat Rating : 6 Other Clinical Data Today's Visit #: 13 Starting Date: 08/03/22 Comments: 1450-1600/95

## 2023-08-22 NOTE — Assessment & Plan Note (Signed)
 Workup of common allergy panel done last month.  Patient is trying to figure out if she is experiencing a reaction to something that is leading to her symptoms of fatigue and joint aches.

## 2023-08-29 ENCOUNTER — Encounter: Payer: Self-pay | Admitting: Family

## 2023-09-12 ENCOUNTER — Ambulatory Visit (INDEPENDENT_AMBULATORY_CARE_PROVIDER_SITE_OTHER): Admitting: Family Medicine

## 2023-09-14 ENCOUNTER — Encounter: Payer: Self-pay | Admitting: Family

## 2023-09-14 ENCOUNTER — Ambulatory Visit: Payer: 59 | Admitting: Family

## 2023-09-14 VITALS — BP 114/78 | HR 84 | Temp 98.2°F | Ht 67.0 in | Wt 200.1 lb

## 2023-09-14 DIAGNOSIS — R7989 Other specified abnormal findings of blood chemistry: Secondary | ICD-10-CM | POA: Diagnosis not present

## 2023-09-14 DIAGNOSIS — R635 Abnormal weight gain: Secondary | ICD-10-CM

## 2023-09-14 DIAGNOSIS — Z6830 Body mass index (BMI) 30.0-30.9, adult: Secondary | ICD-10-CM

## 2023-09-14 DIAGNOSIS — Z91018 Allergy to other foods: Secondary | ICD-10-CM | POA: Diagnosis not present

## 2023-09-14 DIAGNOSIS — E6609 Other obesity due to excess calories: Secondary | ICD-10-CM

## 2023-09-14 DIAGNOSIS — E66811 Obesity, class 1: Secondary | ICD-10-CM | POA: Diagnosis not present

## 2023-09-14 MED ORDER — EPINEPHRINE 0.3 MG/0.3ML IJ SOAJ: INTRAMUSCULAR | 1 refills | Status: AC

## 2023-09-14 MED ORDER — SEMAGLUTIDE (1 MG/DOSE) 4 MG/3ML ~~LOC~~ SOPN: 1.0000 mg | PEN_INJECTOR | SUBCUTANEOUS | 1 refills | Status: DC

## 2023-09-14 NOTE — Progress Notes (Signed)
 Patient ID: Dominique Davis, female    DOB: [birth year], 38 y.o.   MRN: [id number]  Chief Complaint  Patient presents with   abnormal weight gain  Discussed the use of AI scribe software for clinical note transcription with the patient, who gave verbal consent to proceed.  History of Present Illness The patient, with a history of obesity and food allergies, presents for a follow-up visit. She reports a discrepancy in her weight measurements between the [facility] and the current office, with the latter consistently showing higher values. She is currently on Ozempic for weight management, which she initially started at the wellness clinic. She plans to increase the dose to one full unit next week. 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 She has experienced anaphylaxis in the past, which seems to be triggered by certain perfumes. She carries an EpiPen for this reason. Recently, the patient has been feeling excessively tired and requested a thyroid panel. She reports that her mother has hypothyroidism and that a recent TSH test showed elevated levels. She has never been diagnosed with hypothyroidism or required medication for it in the past.  Assessment & Plan Obesity - She is on Ozempic for weight management with a plan to increase the dose to 1 mg due weight loss stagnation. Discrepancy in weight measurements noted between this clinic and [facility]. Continued monthly follow-up with [facility] advised. - Send prescription for Ozempic 1 mg, continue to advise on possible SE to watch out for. - Advise follow-up with [facility] for weight management and ask if they will take over Ozempic prescribing to help pt avoid extra visits. - Will continue to monitor.  Elevated TSH/Possible Subclinical Hypothyroidism - Recent TSH elevated at 5.0. No history of hypothyroidism or treatment.  Monitoring required if T4 is normal due to familial predisposition. - Order TSH and free T4 tests. - Monitor thyroid function every six months if T4 is normal but TSH elevated again.  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 Advised to keep an EpiPen. - Send refill for EpiPen.      Assessment & Plan:   Subjective:    Outpatient Medications Prior to Visit  Medication Sig Dispense Refill   buPROPion (WELLBUTRIN XL) 150 MG 24 hr tablet Take 1 tablet (150 mg total) by mouth daily. 90 tablet 1   Cholecalciferol (VITAMIN D3) 50 MCG (2000 UT) capsule Take 1 capsule (2,000 Units total) by mouth daily.     EPINEPHrine 0.3 mg/0.3 mL IJ SOAJ injection INJECT 0.3 ML (0.3 MG) INTO THE MUSCLE ONCE AS NEEDED AS DIRECTED 1 each 1   fluticasone (FLONASE) 50 MCG/ACT nasal spray Place 2 sprays into both nostrils daily. 16 g 6   Lemborexant (DAYVIGO) 5 MG TABS Take 1 tablet (5 mg total) by mouth at bedtime as needed (For sleep.). 30 tablet 1   norethindrone-ethinyl estradiol (JUNEL 1/20) 1-20 MG-MCG tablet Take one tablet daily-skipping placebo 90 tablet 4   ORILISSA 150 MG TABS Take by mouth.     OZEMPIC, 0.25 OR 0.5 MG/DOSE, 2 MG/3ML SOPN INJECT SUBCUTANEOUSLY 0.5 MG  EVERY WEEK AT 12 NOON 9 mL 3   prazosin (MINIPRESS) 2 MG capsule Take 1 capsule (2 mg total) by mouth at bedtime. 90 capsule 1   Probiotic Product (CULTURELLE PROBIOTICS PO) Take by mouth daily.     sertraline (ZOLOFT) 50 MG  tablet Take 50 mg by mouth daily.     sodium chloride (OCEAN) 0.65 % SOLN nasal spray Place 1 spray into both nostrils as needed for congestion. 88 mL 0   No facility-administered medications prior to visit.   Past Medical History:  Diagnosis Date   Abdominal pain 05/10/2019   Anxiety    Back pain    Bipolar disorder (HCC) 06/30/2022   Bloating 05/10/2019   BMI 29.0-29.9,adult 08/30/2011   Depression    Depression screen 08/03/2022   Dysmenorrhea     Edema    Encounter for insertion of Mirena IUD 03/01/2016   03/01/16    Food allergy 03/04/2015   High cholesterol    Joint pain    Loss of weight 11/21/2013   MDD (major depressive disorder) 10/05/2015   Migraines    Obesity    Other fatigue 08/03/2022   Pre-diabetes    Small intestinal bacterial overgrowth 01/2014   CIP/FLAG X7 DAYS(2X 2015)   SOBOE (shortness of breath on exertion) 08/03/2022   Past Surgical History:  Procedure Laterality Date   BACTERIAL OVERGROWTH TEST N/A 01/03/2014   CANCELLED   BACTERIAL OVERGROWTH TEST N/A 01/10/2014   0 PPM AT START, 65 PPM(75 MINS), 58 PPM(105 MINS)   WISDOM TOOTH EXTRACTION     x2   Allergies  Allergen Reactions   Black Pepper [Piper] Rash      Objective:    Physical Exam Vitals and nursing note reviewed.  Constitutional:      Appearance: Normal appearance. She is obese.  Cardiovascular:     Rate and Rhythm: Normal rate and regular rhythm.  Pulmonary:     Effort: Pulmonary effort is normal.     Breath sounds: Normal breath sounds.  Musculoskeletal:        General: Normal range of motion.  Skin:    General: Skin is warm and dry.  Neurological:     Mental Status: She is alert.  Psychiatric:        Mood and Affect: Mood normal.        Behavior: Behavior normal.    BP 114/78 (BP Location: Left Arm, Patient Position: Sitting, Cuff Size: Large)   Pulse 84   Temp 98.2 F (36.8 C) (Temporal)   Ht 5\' 7"  (1.702 m)   Wt 200 lb 2 oz (90.8 kg)   LMP 08/15/2023   SpO2 100%   BMI 31.34 kg/m  Wt Readings from Last 3 Encounters:  09/14/23 200 lb 2 oz (90.8 kg)  08/15/23 188 lb (85.3 kg)  07/20/23 196 lb 12.8 oz (89.3 kg)       Dulce Sellar, NP

## 2023-09-14 NOTE — Assessment & Plan Note (Signed)
 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 Advised to keep an EpiPen. - Send refill for EpiPen.

## 2023-09-14 NOTE — Assessment & Plan Note (Signed)
 She is on Ozempic for weight management with a plan to increase the dose to 1 mg due weight loss stagnation. Discrepancy in weight measurements noted between this clinic and [facility]. Continued monthly follow-up with [facility] advised. - Send prescription for Ozempic 1 mg. - Advise follow-up with [facility] for weight management and ask if they will take over Ozempic prescribing to help pt avoid extra visits. - Will continue to monitor.

## 2023-09-15 ENCOUNTER — Encounter: Payer: Self-pay | Admitting: Family

## 2023-09-15 LAB — TSH: TSH: 4.01 u[IU]/mL (ref 0.450–4.500)

## 2023-09-15 LAB — T4, FREE: Free T4: 1.02 ng/dL (ref 0.82–1.77)

## 2023-09-18 ENCOUNTER — Encounter: Payer: Self-pay | Admitting: Obstetrics & Gynecology

## 2023-09-25 ENCOUNTER — Other Ambulatory Visit: Payer: Self-pay | Admitting: Obstetrics & Gynecology

## 2023-09-25 ENCOUNTER — Other Ambulatory Visit: Payer: Self-pay | Admitting: *Deleted

## 2023-09-25 DIAGNOSIS — N941 Unspecified dyspareunia: Secondary | ICD-10-CM

## 2023-09-25 DIAGNOSIS — N809 Endometriosis, unspecified: Secondary | ICD-10-CM

## 2023-09-25 DIAGNOSIS — R102 Pelvic and perineal pain: Secondary | ICD-10-CM

## 2023-09-25 MED ORDER — ORILISSA 150 MG PO TABS: 1.0000 | ORAL_TABLET | Freq: Every day | ORAL | 4 refills | Status: AC

## 2023-09-25 NOTE — Progress Notes (Signed)
 Rx sent in for Orilissa   Dominique Gumbs, DO Attending Obstetrician & Gynecologist, [facility] for [facility], [facility]

## 2023-10-12 ENCOUNTER — Ambulatory Visit (INDEPENDENT_AMBULATORY_CARE_PROVIDER_SITE_OTHER): Admitting: Family Medicine

## 2023-10-12 ENCOUNTER — Encounter (INDEPENDENT_AMBULATORY_CARE_PROVIDER_SITE_OTHER): Payer: Self-pay | Admitting: Family Medicine

## 2023-10-12 VITALS — BP 102/68 | HR 87 | Temp 98.2°F | Ht 67.0 in | Wt 189.0 lb

## 2023-10-12 DIAGNOSIS — E669 Obesity, unspecified: Secondary | ICD-10-CM | POA: Diagnosis not present

## 2023-10-12 DIAGNOSIS — Z6829 Body mass index (BMI) 29.0-29.9, adult: Secondary | ICD-10-CM

## 2023-10-12 DIAGNOSIS — R635 Abnormal weight gain: Secondary | ICD-10-CM

## 2023-10-12 DIAGNOSIS — R102 Pelvic and perineal pain: Secondary | ICD-10-CM | POA: Diagnosis not present

## 2023-10-12 DIAGNOSIS — E6609 Other obesity due to excess calories: Secondary | ICD-10-CM

## 2023-10-12 MED ORDER — SEMAGLUTIDE (1 MG/DOSE) 4 MG/3ML ~~LOC~~ SOPN: 1.0000 mg | PEN_INJECTOR | SUBCUTANEOUS | 1 refills | Status: DC

## 2023-10-12 NOTE — Progress Notes (Signed)
 SUBJECTIVE:  Chief Complaint: Obesity  Interim History: patient here for follow up- last time she was seen was 08/15/23.  She is fighting with insurance about her endometriosis medication.  She is dealing with quite a bit of pain and frustration.  Per patient PCP would like us  to take control of her ozempic  RX.  Mood wise she is struggling.  She is not feeling much motivation.  She has been doing reasonably with her food intake.  Dominique Davis is here to discuss her progress with her obesity treatment plan. She is on the keeping a food journal and adhering to recommended goals of 1450-1600 calories and 95 grams of protein and states she is following her eating plan approximately 50 % of the time. She states she is exercising 30 minutes 2 times per week.   OBJECTIVE: Visit Diagnoses: Problem List Items Addressed This Visit       Other   Obesity   Anthropometric Measurements Height: 5\' 7"  (1.702 m) Weight: 189 lb (85.7 kg) BMI (Calculated): 29.59 Weight at Last Visit: 188 lb Weight Lost Since Last Visit: 0 Weight Gained Since Last Visit: 1 Starting Weight: 206 lb Total Weight Loss (lbs): 17 lb (7.711 kg) Body Composition  Body Fat %: 36.4 % Fat Mass (lbs): 69 lbs Muscle Mass (lbs): 114.6 lbs Total Body Water (lbs): 84.2 lbs Visceral Fat Rating : 6 Other Clinical Data Today's Visit #: 14 Starting Date: 08/03/22 Comments: 1450-1600/95  Continue semaglutide  at 1mg  dose.  No change in medication.  Needs refill today.       Relevant Medications   Semaglutide , 1 MG/DOSE, 4 MG/3ML SOPN   Abnormal weight gain   Relevant Medications   Semaglutide , 1 MG/DOSE, 4 MG/3ML SOPN   Pelvic pain - Primary   Patient seeing GYN for management of her pelvic pain.  Insurance unfortunately has been giving her a hard time with endometriosis medication.  Will follow up at next appointment to discuss treatment options if insurance continues to deny coverage for medication.      Other Visit  Diagnoses       BMI 29.0-29.9,adult           No data recorded       10/12/2023    1:00 PM 09/14/2023    1:53 PM 08/15/2023    1:00 PM  Vitals with BMI  Height 5\' 7"  5\' 7"  5\' 7"   Weight 189 lbs 200 lbs 2 oz 188 lbs  BMI 29.59 31.34 29.44  Systolic 102 114 865  Diastolic 68 78 78  Pulse 87 84 88      ASSESSMENT AND PLAN:  Diet: Dominique Davis is currently in the action stage of change. As such, her goal is to continue with weight loss efforts and has agreed to keeping a food journal and adhering to recommended goals of 1450-1600 calories and 95 or more grams of protein daily.  Patient to start food log or journaling meal plan.  The initial goal will be to habitually log or journal for at least 4 days a week.  The expectation it that patient may not initially meet calorie or protein goals as the nturitional understanding of food intake is begun.  We discussed the 10:1 ratio when reading a food label.  Patient agrees to keep a food log either electronically or on paper and bring to the next appointment to be able to dissect and discuss it with provider.    Exercise:  For substantial health benefits, adults should do at least  150 minutes (2 hours and 30 minutes) a week of moderate-intensity, or 75 minutes (1 hour and 15 minutes) a week of vigorous-intensity aerobic physical activity, or an equivalent combination of moderate- and vigorous-intensity aerobic activity. Aerobic activity should be performed in episodes of at least 10 minutes, and preferably, it should be spread throughout the week.  Behavior Modification:  We discussed the following Behavioral Modification Strategies today: increasing lean protein intake, decreasing simple carbohydrates, meal planning and cooking strategies, keeping healthy foods in the home, planning for success, and keep a strict food journal.   Return in about 6 weeks (around 11/23/2023).   She was informed of the importance of frequent follow up visits to  maximize her success with intensive lifestyle modifications for her multiple health conditions.  Attestation Statements:   Reviewed by clinician on day of visit: allergies, medications, problem list, medical history, surgical history, family history, social history, and previous encounter notes.     Donaciano Frizzle, MD

## 2023-10-23 DIAGNOSIS — R102 Pelvic and perineal pain: Secondary | ICD-10-CM | POA: Insufficient documentation

## 2023-10-23 NOTE — Assessment & Plan Note (Signed)
 Patient seeing GYN for management of her pelvic pain.  Insurance unfortunately has been giving her a hard time with endometriosis medication.  Will follow up at next appointment to discuss treatment options if insurance continues to deny coverage for medication.

## 2023-10-23 NOTE — Assessment & Plan Note (Addendum)
 Anthropometric Measurements Height: 5\' 7"  (1.702 m) Weight: 189 lb (85.7 kg) BMI (Calculated): 29.59 Weight at Last Visit: 188 lb Weight Lost Since Last Visit: 0 Weight Gained Since Last Visit: 1 Starting Weight: 206 lb Total Weight Loss (lbs): 17 lb (7.711 kg) Body Composition  Body Fat %: 36.4 % Fat Mass (lbs): 69 lbs Muscle Mass (lbs): 114.6 lbs Total Body Water (lbs): 84.2 lbs Visceral Fat Rating : 6 Other Clinical Data Today's Visit #: 14 Starting Date: 08/03/22 Comments: 1450-1600/95  Continue semaglutide  at 1mg  dose.  No change in medication.  Needs refill today.

## 2023-11-16 ENCOUNTER — Ambulatory Visit: Admitting: Family

## 2023-11-16 ENCOUNTER — Encounter: Payer: Self-pay | Admitting: Family

## 2023-11-16 VITALS — BP 118/64 | HR 83 | Temp 98.0°F | Ht 67.0 in | Wt 189.2 lb

## 2023-11-16 DIAGNOSIS — H1013 Acute atopic conjunctivitis, bilateral: Secondary | ICD-10-CM

## 2023-11-16 MED ORDER — AZELASTINE HCL 0.05 % OP SOLN: 1.0000 [drp] | Freq: Two times a day (BID) | OPHTHALMIC | 12 refills | Status: DC

## 2023-11-16 NOTE — Progress Notes (Signed)
 Patient ID: Dominique Davis, female    DOB: 1985/08/18, 38 y.o.   MRN: [id number]  Chief Complaint  Patient presents with   Eye Pain    Left eye has redness and painful woke up this morning with this both eyes are dry.  Discussed the use of AI scribe software for clinical note transcription with the patient, who gave verbal consent to proceed.  History of Present Illness   Dominique Davis is a 38 year old female with a history of dry eyes who presents with bilateral eye redness and drainage.  She woke up this morning with both eyes red and experiencing watery drainage. The sensation in her eyes is abrasive and sandpapery. She has frequent eye infections and chronic dry eyes. Previously, Blink and another unspecified eye drop provided temporary relief. Restasis  was effective but is now less accessible due to insurance coverage. She has not tried Xiidra for similar reasons. She received a Cimzia shot earlier this week and is unsure if it is related to her symptoms. Her eyes worsen in dry environments, such as at her workplace, especially with air blowing around. No itching is present. Watery drainage occurs without crusting or discoloration.   Assessment and Plan    Allergic Conjunctivitis Bilateral eye redness, burning sensation, itching and watery drainage suggest allergic conjunctivitis. Recent Cimzia injection unlikely related. - Prescribed azelastine ophthalmic drops, one drop in each eye twice daily for up to one week. - Advised to report if no improvement for alternative treatments.  Chronic Dry Eye Syndrome Chronic dry eyes with temporary relief from over-the-counter drops. Restasis  not covered by insurance. - Check insurance coverage for different RX dry eye medication, e.g. Xiidra or Meibo. - Advise over-the-counter lubricating eye drops three to four times daily. - Suggest exploring GoodRx for Restasis  cost savings.    Subjective:    Outpatient Medications Prior to Visit   Medication Sig Dispense Refill   buPROPion  (WELLBUTRIN  XL) 150 MG 24 hr tablet Take 1 tablet (150 mg total) by mouth daily. 90 tablet 1   certolizumab pegol (CIMZIA) 200 MG/ML prefilled syringe Inject 400 mg into the skin.     Cholecalciferol (VITAMIN D3) 50 MCG (2000 UT) capsule Take 1 capsule (2,000 Units total) by mouth daily.     EPINEPHrine  0.3 mg/0.3 mL IJ SOAJ injection INJECT 0.3 ML (0.3 MG) INTO THE MUSCLE ONCE AS NEEDED AS DIRECTED 1 each 1   fluticasone  (FLONASE ) 50 MCG/ACT nasal spray Place 2 sprays into both nostrils daily. 16 g 6   norethindrone -ethinyl estradiol (JUNEL 1/20) 1-20 MG-MCG tablet Take one tablet daily-skipping placebo 90 tablet 4   ORILISSA  150 MG TABS Take 1 tablet (150 mg total) by mouth daily. 90 tablet 4   prazosin  (MINIPRESS ) 2 MG capsule Take 1 capsule (2 mg total) by mouth at bedtime. 90 capsule 1   Probiotic Product (CULTURELLE PROBIOTICS PO) Take by mouth daily.     Semaglutide , 1 MG/DOSE, 4 MG/3ML SOPN Inject 1 mg as directed once a week. 3 mL 1   sertraline  (ZOLOFT ) 50 MG tablet Take 50 mg by mouth daily.     sodium chloride (OCEAN) 0.65 % SOLN nasal spray Place 1 spray into both nostrils as needed for congestion. 88 mL 0   Lemborexant  (DAYVIGO ) 5 MG TABS Take 1 tablet (5 mg total) by mouth at bedtime as needed (For sleep.). (Patient not taking: Reported on 11/16/2023) 30 tablet 1   No facility-administered medications prior to visit.   Past Medical  History:  Diagnosis Date   Abdominal pain 05/10/2019   Anxiety    Back pain    Bipolar disorder (HCC) 06/30/2022   Bloating 05/10/2019   BMI 29.0-29.9,adult 08/30/2011   Depression    Depression screen 08/03/2022   Dysmenorrhea    Edema    Encounter for insertion of Mirena  IUD 03/01/2016   03/01/16    Food allergy  03/04/2015   High cholesterol    Joint pain    Loss of weight 11/21/2013   MDD (major depressive disorder) 10/05/2015   Migraines    Obesity    Other fatigue 08/03/2022    Pre-diabetes    Small intestinal bacterial overgrowth 01/2014   CIP/FLAG X7 DAYS(2X 2015)   SOBOE (shortness of breath on exertion) 08/03/2022   Past Surgical History:  Procedure Laterality Date   BACTERIAL OVERGROWTH TEST N/A 01/03/2014   CANCELLED   BACTERIAL OVERGROWTH TEST N/A 01/10/2014   0 PPM AT START, 65 PPM(75 MINS), 58 PPM(105 MINS)   WISDOM TOOTH EXTRACTION     x2   Allergies  Allergen Reactions   Black Pepper [Piper] Rash      Objective:    Physical Exam Vitals and nursing note reviewed.  Constitutional:      Appearance: Normal appearance.   Eyes:     General:        Right eye: No discharge or hordeolum.        Left eye: No discharge or hordeolum.     Conjunctiva/sclera:     Right eye: Right conjunctiva is injected (both conjunctiva & sclera).     Left eye: Left conjunctiva is injected (both conjunctiva & sclera).    Cardiovascular:     Rate and Rhythm: Normal rate and regular rhythm.  Pulmonary:     Effort: Pulmonary effort is normal.     Breath sounds: Normal breath sounds.   Musculoskeletal:        General: Normal range of motion.   Skin:    General: Skin is warm and dry.   Neurological:     Mental Status: She is alert.   Psychiatric:        Mood and Affect: Mood normal.        Behavior: Behavior normal.    BP 118/64   Pulse 83   Temp 98 F (36.7 C) (Temporal)   Ht 5' 7 (1.702 m)   Wt 189 lb 3.2 oz (85.8 kg)   SpO2 98%   BMI 29.63 kg/m  Wt Readings from Last 3 Encounters:  11/16/23 189 lb 3.2 oz (85.8 kg)  10/12/23 189 lb (85.7 kg)  09/14/23 200 lb 2 oz (90.8 kg)       Versa Gore, NP

## 2023-11-23 ENCOUNTER — Ambulatory Visit (INDEPENDENT_AMBULATORY_CARE_PROVIDER_SITE_OTHER): Admitting: Family Medicine

## 2023-11-30 ENCOUNTER — Ambulatory Visit (INDEPENDENT_AMBULATORY_CARE_PROVIDER_SITE_OTHER): Admitting: Family Medicine

## 2023-11-30 ENCOUNTER — Encounter (INDEPENDENT_AMBULATORY_CARE_PROVIDER_SITE_OTHER): Payer: Self-pay | Admitting: Family Medicine

## 2023-11-30 VITALS — BP 122/85 | HR 84 | Temp 98.3°F | Ht 67.0 in | Wt 187.0 lb

## 2023-11-30 DIAGNOSIS — F419 Anxiety disorder, unspecified: Secondary | ICD-10-CM

## 2023-11-30 DIAGNOSIS — E669 Obesity, unspecified: Secondary | ICD-10-CM | POA: Diagnosis not present

## 2023-11-30 DIAGNOSIS — F32A Depression, unspecified: Secondary | ICD-10-CM

## 2023-11-30 DIAGNOSIS — Z6829 Body mass index (BMI) 29.0-29.9, adult: Secondary | ICD-10-CM

## 2023-11-30 DIAGNOSIS — R635 Abnormal weight gain: Secondary | ICD-10-CM

## 2023-11-30 DIAGNOSIS — E66811 Obesity, class 1: Secondary | ICD-10-CM

## 2023-11-30 MED ORDER — BUPROPION HCL ER (XL) 150 MG PO TB24
150.0000 mg | ORAL_TABLET | Freq: Every day | ORAL | 1 refills | Status: DC
Start: 2023-11-30 — End: 2024-03-25

## 2023-11-30 MED ORDER — SEMAGLUTIDE (1 MG/DOSE) 4 MG/3ML ~~LOC~~ SOPN: 1.0000 mg | PEN_INJECTOR | SUBCUTANEOUS | 1 refills | Status: DC

## 2023-11-30 NOTE — Assessment & Plan Note (Signed)
 Doing well on zoloft  and wellbutrin .  Mental health has significantly improved now that she is not in constant pain.  Needs refill of wellbutrin  today- can continue 150mg  dose.

## 2023-11-30 NOTE — Assessment & Plan Note (Signed)
 Doing very well on ozempic  in terms of intake quantity and choices.  Needs a refill of ozempic  today.  No change in dose.  Follow up on response at next appointment.

## 2023-11-30 NOTE — Progress Notes (Signed)
 SUBJECTIVE:  Chief Complaint: Obesity  Interim History: Patient has gotten a diagnosis since last appointment.  Non radiographic axial spondyloarthritis.  She has started cimzia and is feeling much better; 2 shots every 2 weeks then 1x a month. Mental health wise she is feeling much better.  At work she is doing reasonably.  Her relationship is stable.  She is tracking daily with some variability on the weekend.  She is trying to get the protein in which is still difficult but she is trying to force getting it in.  Her birthday is next month and she is going to a concert, getting a tattoo and getting photos taken.   Dominique Davis is here to discuss her progress with her obesity treatment plan. She is on the Category 3 Plan and states she is following her eating plan approximately 85 % of the time. She states she is exercising 30-60 minutes 2 times per week.   OBJECTIVE: Visit Diagnoses: Problem List Items Addressed This Visit       Other   Obesity   Relevant Medications   Semaglutide , 1 MG/DOSE, 4 MG/3ML SOPN   Abnormal weight gain   Doing very well on ozempic  in terms of intake quantity and choices.  Needs a refill of ozempic  today.  No change in dose.  Follow up on response at next appointment.      Relevant Medications   Semaglutide , 1 MG/DOSE, 4 MG/3ML SOPN   Anxiety and depression - Primary   Doing well on zoloft  and wellbutrin .  Mental health has significantly improved now that she is not in constant pain.  Needs refill of wellbutrin  today- can continue 150mg  dose.      Relevant Medications   buPROPion  (WELLBUTRIN  XL) 150 MG 24 hr tablet   Other Visit Diagnoses       BMI 29.0-29.9,adult           Vitals Temp: 98.3 F (36.8 C) BP: 122/85 Pulse Rate: 84 SpO2: 99 %   Anthropometric Measurements Height: 5' 7 (1.702 m) Weight: 187 lb (84.8 kg) BMI (Calculated): 29.28 Weight at Last Visit: 189lb Weight Lost Since Last Visit: 2lb Weight Gained Since Last Visit:  0lb Starting Weight: 206lb Total Weight Loss (lbs): 19 lb (8.618 kg)   Body Composition  Body Fat %: 38.5 % Fat Mass (lbs): 72.2 lbs Muscle Mass (lbs): 109.6 lbs Total Body Water (lbs): 85.2 lbs Visceral Fat Rating : 7   Other Clinical Data Fasting: No Labs: No Today's Visit #: 15 Starting Date: 08/03/22     ASSESSMENT AND PLAN:  Diet: [name] is currently in the action stage of change. As such, her goal is to continue with weight loss efforts and has agreed to keeping a food journal and adhering to recommended goals of 1450-1600 calories and 95 or more grams of protein daily.   Exercise:  For substantial health benefits, adults should do at least 150 minutes (2 hours and 30 minutes) a week of moderate-intensity, or 75 minutes (1 hour and 15 minutes) a week of vigorous-intensity aerobic physical activity, or an equivalent combination of moderate- and vigorous-intensity aerobic activity. Aerobic activity should be performed in episodes of at least 10 minutes, and preferably, it should be spread throughout the week.  Behavior Modification:  We discussed the following Behavioral Modification Strategies today: increasing lean protein intake, decreasing simple carbohydrates, increasing vegetables, avoiding temptations, planning for success, and keep a strict food journal.   Return in about 5 weeks (around 01/04/2024).   She  was informed of the importance of frequent follow up visits to maximize her success with intensive lifestyle modifications for her multiple health conditions.  Attestation Statements:   Reviewed by clinician on day of visit: allergies, medications, problem list, medical history, surgical history, family history, social history, and previous encounter notes.    Adelita Cho, MD

## 2023-12-15 ENCOUNTER — Ambulatory Visit: Admitting: Nurse Practitioner

## 2023-12-15 VITALS — BP 118/70 | HR 89 | Temp 98.5°F | Ht 67.0 in | Wt 193.6 lb

## 2023-12-15 DIAGNOSIS — M45A0 Non-radiographic axial spondyloarthritis of unspecified sites in spine: Secondary | ICD-10-CM

## 2023-12-15 DIAGNOSIS — Z0000 Encounter for general adult medical examination without abnormal findings: Secondary | ICD-10-CM | POA: Diagnosis not present

## 2023-12-15 DIAGNOSIS — F39 Unspecified mood [affective] disorder: Secondary | ICD-10-CM

## 2023-12-15 DIAGNOSIS — E785 Hyperlipidemia, unspecified: Secondary | ICD-10-CM

## 2023-12-15 DIAGNOSIS — Z1329 Encounter for screening for other suspected endocrine disorder: Secondary | ICD-10-CM

## 2023-12-15 DIAGNOSIS — R102 Pelvic and perineal pain: Secondary | ICD-10-CM

## 2023-12-15 DIAGNOSIS — R42 Dizziness and giddiness: Secondary | ICD-10-CM | POA: Diagnosis not present

## 2023-12-15 DIAGNOSIS — E559 Vitamin D deficiency, unspecified: Secondary | ICD-10-CM

## 2023-12-15 DIAGNOSIS — E669 Obesity, unspecified: Secondary | ICD-10-CM

## 2023-12-15 DIAGNOSIS — F5102 Adjustment insomnia: Secondary | ICD-10-CM
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
## 2023-12-18 ENCOUNTER — Other Ambulatory Visit (INDEPENDENT_AMBULATORY_CARE_PROVIDER_SITE_OTHER)

## 2023-12-18 ENCOUNTER — Encounter: Payer: 59 | Admitting: Family Medicine

## 2023-12-18 DIAGNOSIS — E559 Vitamin D deficiency, unspecified: Secondary | ICD-10-CM

## 2023-12-18 DIAGNOSIS — E669 Obesity, unspecified: Secondary | ICD-10-CM

## 2023-12-18 DIAGNOSIS — E785 Hyperlipidemia, unspecified: Secondary | ICD-10-CM

## 2023-12-18 DIAGNOSIS — Z0000 Encounter for general adult medical examination without abnormal findings: Secondary | ICD-10-CM

## 2023-12-18 DIAGNOSIS — Z1329 Encounter for screening for other suspected endocrine disorder: Secondary | ICD-10-CM

## 2023-12-19 LAB — CBC WITH DIFFERENTIAL/PLATELET
Basophils Absolute: 0 x10E3/uL (ref 0.0–0.2)
Basos: 0 %
EOS (ABSOLUTE): 0 x10E3/uL (ref 0.0–0.4)
Eos: 0 %
Hematocrit: 41.6 % (ref 34.0–46.6)
Hemoglobin: 13.7 g/dL (ref 11.1–15.9)
Immature Grans (Abs): 0 x10E3/uL (ref 0.0–0.1)
Immature Granulocytes: 0 %
Lymphocytes Absolute: 2.9 x10E3/uL (ref 0.7–3.1)
Lymphs: 37 %
MCH: 29.1 pg (ref 26.6–33.0)
MCHC: 32.9 g/dL (ref 31.5–35.7)
MCV: 88 fL (ref 79–97)
Monocytes Absolute: 0.5 x10E3/uL (ref 0.1–0.9)
Monocytes: 6 %
Neutrophils Absolute: 4.3 x10E3/uL (ref 1.4–7.0)
Neutrophils: 57 %
Platelets: 280 x10E3/uL (ref 150–450)
RBC: 4.71 x10E6/uL (ref 3.77–5.28)
RDW: 13 % (ref 11.7–15.4)
WBC: 7.7 x10E3/uL (ref 3.4–10.8)

## 2023-12-19 LAB — COMPREHENSIVE METABOLIC PANEL WITH GFR
ALT: 9 IU/L (ref 0–32)
AST: 10 IU/L (ref 0–40)
Albumin: 4.2 g/dL (ref 3.9–4.9)
Alkaline Phosphatase: 30 IU/L — ABNORMAL LOW (ref 44–121)
BUN/Creatinine Ratio: 15 (ref 9–23)
BUN: 14 mg/dL (ref 6–20)
Bilirubin Total: 0.2 mg/dL (ref 0.0–1.2)
CO2: 17 mmol/L — ABNORMAL LOW (ref 20–29)
Calcium: 8.8 mg/dL (ref 8.7–10.2)
Chloride: 101 mmol/L (ref 96–106)
Creatinine, Ser: 0.96 mg/dL (ref 0.57–1.00)
Globulin, Total: 2.1 g/dL (ref 1.5–4.5)
Glucose: 79 mg/dL (ref 70–99)
Potassium: 3.9 mmol/L (ref 3.5–5.2)
Sodium: 138 mmol/L (ref 134–144)
Total Protein: 6.3 g/dL (ref 6.0–8.5)
eGFR: 79 mL/min/1.73 (ref >59)

## 2023-12-19 LAB — LIPID PANEL
Chol/HDL Ratio: 3.1 ratio (ref 0.0–4.4)
Cholesterol, Total: 185 mg/dL (ref 100–199)
HDL: 59 mg/dL (ref >39)
LDL Chol Calc (NIH): 113 mg/dL — ABNORMAL HIGH (ref 0–99)
Triglycerides: 70 mg/dL (ref 0–149)
VLDL Cholesterol Cal: 13 mg/dL (ref 5–40)

## 2023-12-19 LAB — HEMOGLOBIN A1C
Est. average glucose Bld gHb Est-mCnc: 100 mg/dL
Hgb A1c MFr Bld: 5.1 % (ref 4.8–5.6)

## 2023-12-19 LAB — TSH: TSH: 2.87 u[IU]/mL (ref 0.450–4.500)

## 2023-12-19 LAB — VITAMIN D 25 HYDROXY (VIT D DEFICIENCY, FRACTURES): Vit D, 25-Hydroxy: 50.1 ng/mL (ref 30.0–100.0)

## 2023-12-20 ENCOUNTER — Telehealth: Payer: Self-pay

## 2023-12-20 NOTE — Telephone Encounter (Signed)
 Called and informed pt that [facility] forms were filled out and faxed to [facility] secure fax @ [phone] with a completed transmission log and ready for her pick up

## 2023-12-21 ENCOUNTER — Ambulatory Visit: Payer: Self-pay | Admitting: Nurse Practitioner

## 2023-12-26 ENCOUNTER — Encounter: Payer: Self-pay | Admitting: Nurse Practitioner

## 2023-12-26 DIAGNOSIS — Z0000 Encounter for general adult medical examination without abnormal findings: Secondary | ICD-10-CM | POA: Insufficient documentation

## 2023-12-26 DIAGNOSIS — R42 Dizziness and giddiness: Secondary | ICD-10-CM | POA: Insufficient documentation

## 2023-12-26 MED ORDER — SERTRALINE HCL 25 MG PO TABS: 25.0000 mg | ORAL_TABLET | Freq: Every day | ORAL | 3 refills | Status: AC

## 2023-12-26 NOTE — Assessment & Plan Note (Signed)
 Physical exam complete. She will return to complete fasting lab work as outlined. Pap smear is up to date. Mammogram not indicated. A tetanus booster is due next year. Declines flu and additional COVID vaccines. She is on continuous birth control and manages cholesterol through diet. Continue routine dental and eye exams. Encourage healthy diet and regular exercise. Return to care in one year, sooner as needed.

## 2023-12-26 NOTE — Assessment & Plan Note (Signed)
 Frequent lightheadedness, especially on standing with tachycardia, suggestive of POTS. Cardiac workup is normal, and no tilt table test was performed. Increase fluid and salt intake. Consider cardiology referral if symptoms worsen.

## 2023-12-26 NOTE — Assessment & Plan Note (Addendum)
 She is on Zoloft , Wellbutrin , and prazosin . Zoloft  was reduced due to side effects, while Wellbutrin  remains effective. Prazosin  causes lightheadedness at higher doses. Alternatives for PTSD were discussed, avoiding antipsychotics. Continue Wellbutrin  as prescribed, trial decreasing Zoloft  to 25 mg daily, and continue prazosin . PHQ- 9 and GAD- 9 today. Continue regular therapy appointments. Monitor mood and side effects.

## 2023-12-26 NOTE — Assessment & Plan Note (Signed)
 Managed with healthy diet and exercise. Encouraged to continue. Check lipid panel.

## 2023-12-26 NOTE — Assessment & Plan Note (Signed)
 Endometriosis managed with Orilissa . Pain returned during a medication lapse but resolved upon resumption. Surgical intervention is not planned unless pregnancy is desired. Continue Orilissa  and monitor for symptom changes. Follow up with Ob-Gyn.

## 2023-12-26 NOTE — Addendum Note (Signed)
 Addended by: GRETEL APP on: 12/26/2023 03:28 PM   Modules accepted: Orders

## 2023-12-26 NOTE — Assessment & Plan Note (Signed)
 Managed with Cimzia, which has significantly improved her symptoms. Injection site normal on exam. Continue Cimzia as prescribed and monitor injection sites for adverse reactions. Follow up with rheumatology next month.

## 2023-12-28 ENCOUNTER — Encounter: Payer: Self-pay | Admitting: Nurse Practitioner

## 2024-01-04 ENCOUNTER — Encounter (INDEPENDENT_AMBULATORY_CARE_PROVIDER_SITE_OTHER): Payer: Self-pay | Admitting: Family Medicine

## 2024-01-04 ENCOUNTER — Ambulatory Visit (INDEPENDENT_AMBULATORY_CARE_PROVIDER_SITE_OTHER): Admitting: Family Medicine

## 2024-01-04 VITALS — BP 108/72 | HR 88 | Temp 98.0°F | Ht 67.0 in | Wt 191.0 lb

## 2024-01-04 DIAGNOSIS — F39 Unspecified mood [affective] disorder: Secondary | ICD-10-CM | POA: Diagnosis not present

## 2024-01-04 DIAGNOSIS — E669 Obesity, unspecified: Secondary | ICD-10-CM

## 2024-01-04 DIAGNOSIS — Z6829 Body mass index (BMI) 29.0-29.9, adult: Secondary | ICD-10-CM

## 2024-01-04 DIAGNOSIS — E66811 Obesity, class 1: Secondary | ICD-10-CM

## 2024-01-04 NOTE — Assessment & Plan Note (Addendum)
 Recent decrease in sertraline  down to 25mg  and reports no significant change in mood on this medication (still taking wellbutrin ).  She is feeling over all well on the med combo and dosage.

## 2024-01-04 NOTE — Progress Notes (Signed)
 SUBJECTIVE:  Chief Complaint: Obesity  Interim History: Patient has had a trying few weeks with familial stress.  She did have a photo shoot this past weekend which she is excited to see the proofs of.  She was spaced out on her rheumatology medication and is feeling the pain she was experiencing before again.  Foodwise she has done decently- had a few slip ups that she attributes to boredom.  She is trying to get a significant amount was water in.  She is getting bored with her food prep and options.  She is experiencing quite a bit of constipation.  Dominique Davis is here to discuss her progress with her obesity treatment plan. She is on the Category 3 Plan and states she is following her eating plan approximately 80 % of the time. She states she is not exercising 0 minutes 0 times per week.   OBJECTIVE: Visit Diagnoses: Problem List Items Addressed This Visit       Other   Obesity   Mood disorder (HCC) - Primary   Recent decrease in sertraline  down to 25mg  and reports no significant change in mood on this medication (still taking wellbutrin ).  She is feeling over all well on the med combo and dosage.      Other Visit Diagnoses       BMI 29.0-29.9,adult           Vitals Temp: 98 F (36.7 C) BP: 108/72 Pulse Rate: 88 SpO2: 96 %   Anthropometric Measurements Height: 5' 7 (1.702 m) Weight: 191 lb (86.6 kg) BMI (Calculated): 29.91 Weight at Last Visit: 187lb Weight Lost Since Last Visit: 0 Weight Gained Since Last Visit: 4lb Starting Weight: 206lb Total Weight Loss (lbs): 15 lb (6.804 kg)   Body Composition  Body Fat %: 37.9 % Fat Mass (lbs): 72.6 lbs Muscle Mass (lbs): 113.2 lbs Total Body Water (lbs): 86.4 lbs Visceral Fat Rating : 7   Other Clinical Data Fasting: no Labs: no Today's Visit #: 16 Starting Date: 08/03/22 Comments: Cat 3     ASSESSMENT AND PLAN: Assessment & Plan Mood disorder (HCC) Recent decrease in sertraline  down to 25mg  and reports  no significant change in mood on this medication (still taking wellbutrin ).  She is feeling over all well on the med combo and dosage. Obesity with starting BMI of 34.2  BMI 29.0-29.9,adult    Diet: Dominique Davis is currently in the action stage of change. As such, her goal is to continue with weight loss efforts and has agreed to the Category 3 Plan.   Exercise:  For substantial health benefits, adults should do at least 150 minutes (2 hours and 30 minutes) a week of moderate-intensity, or 75 minutes (1 hour and 15 minutes) a week of vigorous-intensity aerobic physical activity, or an equivalent combination of moderate- and vigorous-intensity aerobic activity. Aerobic activity should be performed in episodes of at least 10 minutes, and preferably, it should be spread throughout the week.  Behavior Modification:  We discussed the following Behavioral Modification Strategies today: increasing lean protein intake, decreasing simple carbohydrates, increasing vegetables, meal planning and cooking strategies, and planning for success.   Return in about 5 weeks (around 02/08/2024).   She was informed of the importance of frequent follow up visits to maximize her success with intensive lifestyle modifications for her multiple health conditions.  Attestation Statements:   Reviewed by clinician on day of visit: allergies, medications, problem list, medical history, surgical history, family history, social history, and previous encounter notes.  Adelita Cho, MD

## 2024-02-08 ENCOUNTER — Encounter (INDEPENDENT_AMBULATORY_CARE_PROVIDER_SITE_OTHER): Payer: Self-pay | Admitting: Family Medicine

## 2024-02-08 ENCOUNTER — Ambulatory Visit (INDEPENDENT_AMBULATORY_CARE_PROVIDER_SITE_OTHER): Admitting: Family Medicine

## 2024-02-08 VITALS — BP 111/74 | HR 90 | Temp 98.1°F | Ht 67.0 in | Wt 187.0 lb

## 2024-02-08 DIAGNOSIS — Z6829 Body mass index (BMI) 29.0-29.9, adult: Secondary | ICD-10-CM

## 2024-02-08 DIAGNOSIS — R635 Abnormal weight gain: Secondary | ICD-10-CM

## 2024-02-08 DIAGNOSIS — E66811 Obesity, class 1: Secondary | ICD-10-CM | POA: Diagnosis not present

## 2024-02-08 DIAGNOSIS — F39 Unspecified mood [affective] disorder: Secondary | ICD-10-CM | POA: Diagnosis not present

## 2024-02-08 MED ORDER — SEMAGLUTIDE (1 MG/DOSE) 4 MG/3ML ~~LOC~~ SOPN: 1.0000 mg | PEN_INJECTOR | SUBCUTANEOUS | 1 refills | Status: DC

## 2024-02-08 NOTE — Assessment & Plan Note (Addendum)
 Overall patient is feeling more motivated and has ability to get up and get out more. She attributes this to a decrease is overall pain.  Will continue on current antidepressant and antianxiolytic.

## 2024-02-08 NOTE — Progress Notes (Signed)
 SUBJECTIVE:  Chief Complaint: Obesity  Interim History: August has been on and off.  First half was good- she felt significantly better and then a her injection decreased she felt old and not as well.  She felt more lack of motivation when her body started hurting again.  When she has flares the flares felt much more painful than she ever felt before. When she is feeling well she is able to get food and nutrition at goal.  She is outside and running around with the dogs. She has had GI discomfort much more frequently.  Over the next few weeks she has an upcoming concert; she is looking into going apple picking.   Dominique Davis is here to discuss her progress with her obesity treatment plan. She is on the Category 3 Plan and states she is following her eating plan approximately 80 % of the time. She states she is not exercising.   OBJECTIVE: Visit Diagnoses: Problem List Items Addressed This Visit       Other   Obesity   Relevant Medications   Semaglutide , 1 MG/DOSE, 4 MG/3ML SOPN   Abnormal weight gain   Relevant Medications   Semaglutide , 1 MG/DOSE, 4 MG/3ML SOPN   Mood disorder (HCC) - Primary   Other Visit Diagnoses       BMI 29.0-29.9,adult           Vitals Temp: 98.1 F (36.7 C) BP: 111/74 Pulse Rate: 90 SpO2: 100 %   Anthropometric Measurements Height: 5' 7 (1.702 m) Weight: 187 lb (84.8 kg) BMI (Calculated): 29.28 Weight at Last Visit: 191 lb Weight Lost Since Last Visit: 4 Weight Gained Since Last Visit: 0 Starting Weight: 206 lb Total Weight Loss (lbs): 19 lb (8.618 kg)   Body Composition  Body Fat %: 36.4 % Fat Mass (lbs): 68 lbs Muscle Mass (lbs): 113 lbs Total Body Water (lbs): 83.8 lbs Visceral Fat Rating : 6   Other Clinical Data Today's Visit #: 17 Starting Date: 08/03/22 Comments: Cat 3     ASSESSMENT AND PLAN: Assessment & Plan Abnormal weight gain Tolerating current dose of semaglutide  with improved constipation.  She is still  taking miralax daily. Patient encouraged to continue miralax daily then decrease frequency to every other day then after 1 month decrease to every 2nd day.  Follow up on constipation and semaglutide  efficacy at next appointment. Mood disorder (HCC) Overall patient is feeling more motivated and has ability to get up and get out more. She attributes this to a decrease is overall pain.  Will continue on current antidepressant and antianxiolytic. BMI 29.0-29.9,adult  Class 1 obesity due to excess calories without serious comorbidity with body mass index (BMI) of 30.0 to 30.9 in adult    Diet: [name] is currently in the action stage of change. As such, her goal is to continue with weight loss efforts and has agreed to the Category 3 Plan.   Exercise:  For substantial health benefits, adults should do at least 150 minutes (2 hours and 30 minutes) a week of moderate-intensity, or 75 minutes (1 hour and 15 minutes) a week of vigorous-intensity aerobic physical activity, or an equivalent combination of moderate- and vigorous-intensity aerobic activity. Aerobic activity should be performed in episodes of at least 10 minutes, and preferably, it should be spread throughout the week.  Behavior Modification:  We discussed the following Behavioral Modification Strategies today: increasing lean protein intake, decreasing simple carbohydrates, meal planning and cooking strategies, keeping healthy foods in the home, and  planning for success. We discussed various medication options to help [name] with her weight loss efforts and we both agreed to continue semaglutide  at current dose.  Return in about 6 weeks (around 03/21/2024).   She was informed of the importance of frequent follow up visits to maximize her success with intensive lifestyle modifications for her multiple health conditions.  Attestation Statements:   Reviewed by clinician on day of visit: allergies, medications, problem list, medical  history, surgical history, family history, social history, and previous encounter notes.    Adelita Cho MD

## 2024-02-10 NOTE — Assessment & Plan Note (Signed)
 Tolerating current dose of semaglutide  with improved constipation.  She is still taking miralax daily. Patient encouraged to continue miralax daily then decrease frequency to every other day then after 1 month decrease to every 2nd day.  Follow up on constipation and semaglutide  efficacy at next appointment.

## 2024-02-26 ENCOUNTER — Encounter: Payer: Self-pay | Admitting: Obstetrics & Gynecology

## 2024-02-27 ENCOUNTER — Other Ambulatory Visit: Payer: Self-pay | Admitting: Obstetrics & Gynecology

## 2024-02-27 DIAGNOSIS — N809 Endometriosis, unspecified: Secondary | ICD-10-CM

## 2024-02-27 MED ORDER — ORILISSA 150 MG PO TABS: 1.0000 | ORAL_TABLET | Freq: Every day | ORAL | 4 refills | Status: AC

## 2024-02-29 ENCOUNTER — Encounter: Payer: Self-pay | Admitting: *Deleted

## 2024-03-04 ENCOUNTER — Encounter: Payer: Self-pay | Admitting: *Deleted

## 2024-03-14 ENCOUNTER — Other Ambulatory Visit: Payer: Self-pay | Admitting: *Deleted

## 2024-03-14 DIAGNOSIS — Z3041 Encounter for surveillance of contraceptive pills: Secondary | ICD-10-CM

## 2024-03-14 MED ORDER — NORETHINDRONE ACET-ETHINYL EST 1-20 MG-MCG PO TABS: ORAL_TABLET | ORAL | 4 refills | Status: AC

## 2024-03-15 ENCOUNTER — Encounter: Payer: Self-pay | Admitting: Nurse Practitioner

## 2024-03-15 NOTE — Telephone Encounter (Signed)
 Pt scheduled to see Dr. Abbey

## 2024-03-20 ENCOUNTER — Ambulatory Visit

## 2024-03-22 ENCOUNTER — Encounter: Payer: Self-pay | Admitting: Nurse Practitioner

## 2024-03-22 ENCOUNTER — Ambulatory Visit: Admitting: Nurse Practitioner

## 2024-03-22 VITALS — BP 116/80 | HR 94 | Temp 97.9°F | Ht 67.0 in | Wt 189.6 lb

## 2024-03-22 DIAGNOSIS — H04123 Dry eye syndrome of bilateral lacrimal glands: Secondary | ICD-10-CM | POA: Diagnosis not present

## 2024-03-22 DIAGNOSIS — J0140 Acute pansinusitis, unspecified: Secondary | ICD-10-CM | POA: Diagnosis not present

## 2024-03-22 MED ORDER — AMOXICILLIN-POT CLAVULANATE 875-125 MG PO TABS: 1.0000 | ORAL_TABLET | Freq: Two times a day (BID) | ORAL | 0 refills | Status: DC

## 2024-03-22 MED ORDER — CYCLOSPORINE 0.05 % OP EMUL: 1.0000 [drp] | Freq: Two times a day (BID) | OPHTHALMIC | 1 refills | Status: DC

## 2024-03-22 NOTE — Progress Notes (Signed)
 Established Patient Office Visit  Subjective:  Patient ID: Dominique Davis, female    DOB: 1985/06/15  Age: 38 y.o. MRN: [id number]  CC:  Chief Complaint  Patient presents with   Acute Visit    Dry eyes feels as if gravel is in her eyes   Discussed the use of a AI scribe software for clinical note transcription with the patient, who gave verbal consent to proceed.  HPI Dominique Davis is a 38 year old female who presents with worsening dry eyes.  She experiences significantly dry eyes with blurry vision, itching, and burning, worsening over the past few months. Over-the-counter treatments and lubricant drops have been ineffective. Allergy  eye drops caused irritation. She has not used Restasis  due to cost concerns.  Headaches occur primarily around her eyes and forehead, starting a couple of weeks ago. She has a history of allergies with a constantly runny nose and significant postnasal drip. Flonase  has not alleviated nasal congestion. She denies dry mouth and ear pain but occasionally takes Benadryl  at night. Asthma medication was stopped due to its drying effects on her eyes.  HPI   Past Medical History:  Diagnosis Date   Abdominal pain 05/10/2019   Anxiety    Back pain    Bipolar disorder (HCC) 06/30/2022   Bloating 05/10/2019   BMI 29.0-29.9,adult 08/30/2011   Depression    Depression screen 08/03/2022   Dysmenorrhea    Edema    Encounter for insertion of Mirena  IUD 03/01/2016   03/01/16    Food allergy  03/04/2015   High cholesterol    Joint pain    Loss of weight 11/21/2013   MDD (major depressive disorder) 10/05/2015   Migraines    Obesity    Other fatigue 08/03/2022   Pre-diabetes    Small intestinal bacterial overgrowth 01/2014   CIP/FLAG X7 DAYS(2X 2015)   SOBOE (shortness of breath on exertion) 08/03/2022    Past Surgical History:  Procedure Laterality Date   BACTERIAL OVERGROWTH TEST N/A 01/03/2014   CANCELLED   BACTERIAL OVERGROWTH TEST N/A  01/10/2014   0 PPM AT START, 65 PPM(75 MINS), 58 PPM(105 MINS)   WISDOM TOOTH EXTRACTION     x2    Family History  Problem Relation Age of Onset   Hyperlipidemia Mother    Diabetes Mother        gastroparesis   Hypertension Mother    Anxiety disorder Mother    Mental illness Mother        Bipolar Disorder    Alcohol abuse Mother    Depression Mother    Drug abuse Mother    Sleep apnea Mother    Obesity Mother    Hypertension Father    Alcohol abuse Father    Diabetes Maternal Grandmother    Hypertension Maternal Grandmother    Hypertension Paternal Grandmother    Alcohol abuse Paternal Grandfather    Inflammatory bowel disease Neg Hx    Colon cancer Neg Hx     Social History   Socioeconomic History   Marital status: Media Planner    Spouse name: Not on file   Number of children: Not on file   Years of education: Not on file   Highest education level: Bachelor's degree (e.g., BA, AB, BS)  Occupational History   Not on file  Tobacco Use   Smoking status: Never   Smokeless tobacco: Never   Tobacco comments:    Never smoked  Vaping Use   Vaping status: Never Used  Substance and Sexual Activity   Alcohol use: Yes    Comment: rare   Drug use: No   Sexual activity: Yes    Partners: Male    Birth control/protection: Pill  Other Topics Concern   Not on file  Social History Narrative   Not on file   Social Drivers of Health   Financial Resource Strain: Low Risk  (03/16/2024)   Overall Financial Resource Strain (CARDIA)    Difficulty of Paying Living Expenses: Not hard at all  Food Insecurity: No Food Insecurity (03/16/2024)   Hunger Vital Sign    Worried About Running Out of Food in the Last Year: Never true    Ran Out of Food in the Last Year: Never true  Transportation Needs: No Transportation Needs (03/16/2024)   PRAPARE - Administrator, Civil Service (Medical): No    Lack of Transportation (Non-Medical): No  Physical Activity:  Insufficiently Active (03/16/2024)   Exercise Vital Sign    Days of Exercise per Week: 1 day    Minutes of Exercise per Session: 30 min  Stress: Stress Concern Present (03/16/2024)   [employer] of Occupational Health - Occupational Stress Questionnaire    Feeling of Stress: Very much  Social Connections: Moderately Isolated (03/16/2024)   Social Connection and Isolation Panel    Frequency of Communication with Friends and Family: Twice a week    Frequency of Social Gatherings with Friends and Family: Once a week    Attends Religious Services: Never    Database Administrator or Organizations: No    Attends Engineer, Structural: Not on file    Marital Status: Living with partner  Intimate Partner Violence: Not on file     Outpatient Medications Prior to Visit  Medication Sig Dispense Refill   azelastine  (OPTIVAR ) 0.05 % ophthalmic solution Apply 1 drop to eye 2 (two) times daily. 6 mL 12   certolizumab pegol (CIMZIA) 200 MG/ML prefilled syringe Inject 400 mg into the skin.     Cholecalciferol (VITAMIN D3) 50 MCG (2000 UT) capsule Take 1 capsule (2,000 Units total) by mouth daily.     Elagolix Sodium  (ORILISSA ) 150 MG TABS Take 1 tablet (150 mg total) by mouth daily. (Patient not taking: Reported on 03/25/2024) 90 tablet 4   EPINEPHrine  0.3 mg/0.3 mL IJ SOAJ injection INJECT 0.3 ML (0.3 MG) INTO THE MUSCLE ONCE AS NEEDED AS DIRECTED 1 each 1   fluticasone  (FLONASE ) 50 MCG/ACT nasal spray Place 2 sprays into both nostrils daily. 16 g 6   norethindrone -ethinyl estradiol (JUNEL 1/20) 1-20 MG-MCG tablet Take one tablet daily-skipping placebo 90 tablet 4   prazosin  (MINIPRESS ) 2 MG capsule Take 1 capsule (2 mg total) by mouth at bedtime. 90 capsule 1   Probiotic Product (CULTURELLE PROBIOTICS PO) Take by mouth daily.     sertraline  (ZOLOFT ) 25 MG tablet Take 1 tablet (25 mg total) by mouth at bedtime. 90 tablet 3   sodium chloride (OCEAN) 0.65 % SOLN nasal spray Place 1 spray  into both nostrils as needed for congestion. 88 mL 0   buPROPion  (WELLBUTRIN  XL) 150 MG 24 hr tablet Take 1 tablet (150 mg total) by mouth daily. 90 tablet 1   Semaglutide , 1 MG/DOSE, 4 MG/3ML SOPN Inject 1 mg as directed once a week. 3 mL 1   No facility-administered medications prior to visit.    Allergies  Allergen Reactions   Black Pepper [Piper] Rash    ROS Review of Systems Negative  unless indicated in HPI.    Objective:    Physical Exam Constitutional:      Appearance: Normal appearance.  HENT:     Nose:     Right Sinus: Maxillary sinus tenderness and frontal sinus tenderness present.     Left Sinus: Maxillary sinus tenderness and frontal sinus tenderness present.     Mouth/Throat:     Mouth: Mucous membranes are moist.  Eyes:     Conjunctiva/sclera: Conjunctivae normal.     Pupils: Pupils are equal, round, and reactive to light.  Cardiovascular:     Rate and Rhythm: Normal rate and regular rhythm.     Pulses: Normal pulses.     Heart sounds: Normal heart sounds.  Pulmonary:     Effort: Pulmonary effort is normal.     Breath sounds: Normal breath sounds.  Musculoskeletal:     Cervical back: Normal range of motion. No tenderness.  Skin:    General: Skin is warm.     Findings: No bruising.  Neurological:     General: No focal deficit present.     Mental Status: She is alert and oriented to person, place, and time. Mental status is at baseline.  Psychiatric:        Mood and Affect: Mood normal.        Behavior: Behavior normal.        Thought Content: Thought content normal.        Judgment: Judgment normal.     BP 116/80   Pulse 94   Temp 97.9 F (36.6 C)   Ht 5' 7 (1.702 m)   Wt 189 lb 9.6 oz (86 kg)   SpO2 98%   BMI 29.70 kg/m  Wt Readings from Last 3 Encounters:  03/25/24 185 lb (83.9 kg)  03/22/24 189 lb 9.6 oz (86 kg)  02/08/24 187 lb (84.8 kg)     Health Maintenance  Topic Date Due   HPV VACCINES (1 - 3-dose SCDM series) Never done    COVID-19 Vaccine (3 - Pfizer risk series) 04/05/2024 (Originally 10/08/2019)   Influenza Vaccine  09/03/2024 (Originally 01/05/2024)   DTaP/Tdap/Td (5 - Td or Tdap) 12/29/2024   Cervical Cancer Screening (HPV/Pap Cotest)  06/21/2027   Hepatitis C Screening  Completed   HIV Screening  Completed   Pneumococcal Vaccine  Aged Out   Meningococcal B Vaccine  Aged Out   Hepatitis B Vaccines 19-59 Average Risk  Discontinued       Topic Date Due   HPV VACCINES (1 - 3-dose SCDM series) Never done    Lab Results  Component Value Date   TSH 2.870 12/18/2023   Lab Results  Component Value Date   WBC 7.7 12/18/2023   HGB 13.7 12/18/2023   HCT 41.6 12/18/2023   MCV 88 12/18/2023   PLT 280 12/18/2023   Lab Results  Component Value Date   NA 138 12/18/2023   K 3.9 12/18/2023   CO2 17 (L) 12/18/2023   GLUCOSE 79 12/18/2023   BUN 14 12/18/2023   CREATININE 0.96 12/18/2023   BILITOT 0.2 12/18/2023   ALKPHOS 30 (L) 12/18/2023   AST 10 12/18/2023   ALT 9 12/18/2023   PROT 6.3 12/18/2023   ALBUMIN 4.2 12/18/2023   CALCIUM 8.8 12/18/2023   EGFR 79 12/18/2023   GFR 86.57 12/22/2021   Lab Results  Component Value Date   CHOL 185 12/18/2023   Lab Results  Component Value Date   HDL 59 12/18/2023   Lab Results  Component Value Date   LDLCALC 113 (H) 12/18/2023   Lab Results  Component Value Date   TRIG 70 12/18/2023   Lab Results  Component Value Date   CHOLHDL 3.1 12/18/2023   Lab Results  Component Value Date   HGBA1C 5.1 12/18/2023      Assessment & Plan:  Dry eyes Assessment & Plan: Chronic dry eye syndrome with worsening symptoms. Previous treatments ineffective. Insurance issues hindered Restasis  use. - Send prescription for Restasis  to [facility] in [place] and check insurance coverage. - Refer to ophthalmology for further evaluation.  Orders: -     Ambulatory referral to Ophthalmology  Acute non-recurrent pansinusitis Assessment & Plan: Symptoms  consistent with sinusitis - Prescribe Augmentin for 7 days, twice a day. - Advise taking probiotics or yogurt to mitigate gastrointestinal side effects.   Other orders -     Amoxicillin -Pot Clavulanate; Take 1 tablet by mouth 2 (two) times daily.  Dispense: 14 tablet; Refill: 0 -     cycloSPORINE ; Place 1 drop into both eyes 2 (two) times daily.  Dispense: 0.4 mL; Refill: 1    Follow-up: No follow-ups on file.   Christinna Sprung, NP

## 2024-03-25 ENCOUNTER — Ambulatory Visit (INDEPENDENT_AMBULATORY_CARE_PROVIDER_SITE_OTHER): Admitting: Family Medicine

## 2024-03-25 ENCOUNTER — Encounter (INDEPENDENT_AMBULATORY_CARE_PROVIDER_SITE_OTHER): Payer: Self-pay | Admitting: Family Medicine

## 2024-03-25 VITALS — BP 106/69 | HR 87 | Temp 97.8°F | Ht 67.0 in | Wt 185.0 lb

## 2024-03-25 DIAGNOSIS — R635 Abnormal weight gain: Secondary | ICD-10-CM

## 2024-03-25 DIAGNOSIS — Z6828 Body mass index (BMI) 28.0-28.9, adult: Secondary | ICD-10-CM

## 2024-03-25 DIAGNOSIS — F32A Depression, unspecified: Secondary | ICD-10-CM | POA: Diagnosis not present

## 2024-03-25 DIAGNOSIS — F419 Anxiety disorder, unspecified: Secondary | ICD-10-CM | POA: Diagnosis not present

## 2024-03-25 DIAGNOSIS — E66811 Obesity, class 1: Secondary | ICD-10-CM

## 2024-03-25 DIAGNOSIS — E6609 Other obesity due to excess calories: Secondary | ICD-10-CM

## 2024-03-25 MED ORDER — BUPROPION HCL ER (XL) 150 MG PO TB24: 150.0000 mg | ORAL_TABLET | Freq: Every day | ORAL | 1 refills | Status: DC

## 2024-03-25 MED ORDER — SEMAGLUTIDE (1 MG/DOSE) 4 MG/3ML ~~LOC~~ SOPN: 1.0000 mg | PEN_INJECTOR | SUBCUTANEOUS | 1 refills | Status: DC

## 2024-03-25 NOTE — Progress Notes (Signed)
 SUBJECTIVE:  Chief Complaint: Obesity  Interim History: Patient reports a headache for the last month localized around the eyes.  She hasn't had her endometriosis medicine- possible issue with 3 month supply? Food intake over the last 6 weeks have been both good and bad. She has been trying hard to get protein in and eat different food.  She is trying different recipes like soup recipes.  Per meal she is realizing she is having to force herself to eat more.  She may eat a bit of something here and there.  She will eat lunch and dinner relatively well.  She is trying to stay away from processed freezer meals. She is going to [place] this weekend to go to a concert.  She has been getting out of the house and doing more.  Dominique Davis is here to discuss her progress with her obesity treatment plan. She is on the Category 3 Plan and states she is following her eating plan approximately 50 % of the time. She states she is exercising 20 minutes 1 time per week.   OBJECTIVE: Visit Diagnoses: Problem List Items Addressed This Visit       Other   Obesity   Relevant Medications   Semaglutide , 1 MG/DOSE, 4 MG/3ML SOPN   Abnormal weight gain   Relevant Medications   Semaglutide , 1 MG/DOSE, 4 MG/3ML SOPN   Anxiety and depression - Primary   Relevant Medications   buPROPion  (WELLBUTRIN  XL) 150 MG 24 hr tablet   Other Visit Diagnoses       BMI 28.0-28.9,adult           Vitals Temp: 97.8 F (36.6 C) BP: 106/69 Pulse Rate: 87 SpO2: 99 %   Anthropometric Measurements Height: 5' 7 (1.702 m) Weight: 185 lb (83.9 kg) BMI (Calculated): 28.97 Weight at Last Visit: 187 lb Weight Lost Since Last Visit: 2 Weight Gained Since Last Visit: 0 Starting Weight: 206 lb Total Weight Loss (lbs): 21 lb (9.526 kg)   Body Composition  Body Fat %: 36.5 % Fat Mass (lbs): 67.8 lbs Muscle Mass (lbs): 111.8 lbs Total Body Water (lbs): 84 lbs Visceral Fat Rating : 6   Other Clinical  Data Today's Visit #: 18 Starting Date: 08/03/22 Comments: Cat 3     ASSESSMENT AND PLAN: Assessment & Plan Anxiety and depression Feeling well overall.  Needs a refill of Wellbutrin  at this time.  No change in dosage.  No suicidal or homicidal ideation reported.  Will follow-up on treatment at next appointment. Abnormal weight gain Patient would like to slowly titrate off semaglutide .  Will decrease to 1 mg weekly and follow-up at next appointment to ensure no uncontrolled cravings or significant weight gain discussed what to expect in terms of symptoms of hunger and cravings return as patient titrates down on medication will follow-up in 3 weeks Class 1 obesity due to excess calories without serious comorbidity with body mass index (BMI) of 30.0 to 30.9 in adult  BMI 28.0-28.9,adult    Diet: Dominique Davis is currently in the action stage of change. As such, her goal is to continue with weight loss efforts and has agreed to the Category 3 Plan.   Exercise:  For substantial health benefits, adults should do at least 150 minutes (2 hours and 30 minutes) a week of moderate-intensity, or 75 minutes (1 hour and 15 minutes) a week of vigorous-intensity aerobic physical activity, or an equivalent combination of moderate- and vigorous-intensity aerobic activity. Aerobic activity should be performed in episodes  of at least 10 minutes, and preferably, it should be spread throughout the week.  Behavior Modification:  We discussed the following Behavioral Modification Strategies today: increasing lean protein intake, decreasing simple carbohydrates, no skipping meals, meal planning and cooking strategies, planning for success, and keep a strict food journal.   Return in about 5 weeks (around 04/29/2024).   She was informed of the importance of frequent follow up visits to maximize her success with intensive lifestyle modifications for her multiple health conditions.  Attestation Statements:    Reviewed by clinician on day of visit: allergies, medications, problem list, medical history, surgical history, family history, social history, and previous encounter notes.   Dominique Cho, MD

## 2024-03-27 ENCOUNTER — Telehealth: Payer: Self-pay

## 2024-03-27 ENCOUNTER — Other Ambulatory Visit (HOSPITAL_COMMUNITY): Payer: Self-pay

## 2024-03-27 NOTE — Telephone Encounter (Signed)
 Pharmacy Patient Advocate Encounter   Received notification from Onbase that prior authorization for Restasis  0.05% emulsion  is required/requested.   Insurance verification completed.   The patient is insured through [facility].   Per test claim: PA required; PA submitted to above mentioned insurance via Latent Key/confirmation #/EOC [id number] Status is pending

## 2024-03-27 NOTE — Telephone Encounter (Signed)
 Pharmacy Patient Advocate Encounter  Received notification from [employer] that Prior Authorization for Restasis  0.05% emulsion  has been APPROVED from 03/27/24 to 06/05/2038   PA #/Case ID/Reference #: [id number]

## 2024-03-31 NOTE — Assessment & Plan Note (Signed)
 Patient would like to slowly titrate off semaglutide .  Will decrease to 1 mg weekly and follow-up at next appointment to ensure no uncontrolled cravings or significant weight gain discussed what to expect in terms of symptoms of hunger and cravings return as patient titrates down on medication will follow-up in 3 weeks

## 2024-03-31 NOTE — Assessment & Plan Note (Signed)
 Feeling well overall.  Needs a refill of Wellbutrin  at this time.  No change in dosage.  No suicidal or homicidal ideation reported.  Will follow-up on treatment at next appointment.

## 2024-04-02 DIAGNOSIS — J0140 Acute pansinusitis, unspecified: Secondary | ICD-10-CM | POA: Insufficient documentation

## 2024-04-02 NOTE — Assessment & Plan Note (Signed)
 Symptoms consistent with sinusitis - Prescribe Augmentin for 7 days, twice a day. - Advise taking probiotics or yogurt to mitigate gastrointestinal side effects.

## 2024-04-02 NOTE — Assessment & Plan Note (Signed)
 Chronic dry eye syndrome with worsening symptoms. Previous treatments ineffective. Insurance issues hindered Restasis  use. - Send prescription for Restasis  to [facility] in [place] and check insurance coverage. - Refer to ophthalmology for further evaluation.

## 2024-04-29 ENCOUNTER — Ambulatory Visit (INDEPENDENT_AMBULATORY_CARE_PROVIDER_SITE_OTHER): Payer: Self-pay | Admitting: Family Medicine

## 2024-04-29 ENCOUNTER — Encounter (INDEPENDENT_AMBULATORY_CARE_PROVIDER_SITE_OTHER): Payer: Self-pay | Admitting: Family Medicine

## 2024-04-29 VITALS — BP 100/70 | HR 92 | Temp 98.1°F | Ht 67.0 in | Wt 184.0 lb

## 2024-04-29 DIAGNOSIS — Z6834 Body mass index (BMI) 34.0-34.9, adult: Secondary | ICD-10-CM

## 2024-04-29 DIAGNOSIS — Z6828 Body mass index (BMI) 28.0-28.9, adult: Secondary | ICD-10-CM | POA: Diagnosis not present

## 2024-04-29 DIAGNOSIS — E7800 Pure hypercholesterolemia, unspecified: Secondary | ICD-10-CM | POA: Diagnosis not present

## 2024-04-29 DIAGNOSIS — E669 Obesity, unspecified: Secondary | ICD-10-CM | POA: Diagnosis not present

## 2024-04-29 NOTE — Progress Notes (Unsigned)
   SUBJECTIVE:  Chief Complaint: Obesity  Interim History: Patient has been dealing with endometrial pain as her medication has not been paid, having to call to make sure her medication will be ready when it is time for her injection.  She has also been going to concerts more frequently.  She doesn't have much motivation and is having a hard time getting motivated to do activity.  She has given up getting her endometriosis medication.  She thinks she will be seeing a friend for Thanksgiving.  She has another upcoming concert she is going to.  Dominique Davis is here to discuss her progress with her obesity treatment plan. She is on the Category 3 Plan and states she is following her eating plan approximately 50 % of the time. She states she is exercising 20 minutes 1-2 times per week.   OBJECTIVE: Visit Diagnoses: Problem List Items Addressed This Visit       Other   Obesity   Other Visit Diagnoses       Pure hypercholesterolemia    -  Primary     BMI 28.0-28.9,adult           Vitals Temp: 98.1 F (36.7 C) BP: 100/70 Pulse Rate: 92 SpO2: 98 %   Anthropometric Measurements Height: 5' 7 (1.702 m) Weight: 184 lb (83.5 kg) BMI (Calculated): 28.81 Weight at Last Visit: 185 lb Weight Lost Since Last Visit: 1 Weight Gained Since Last Visit: 0 Starting Weight: 206 lb Total Weight Loss (lbs): 22 lb (9.979 kg)   Body Composition  Body Fat %: 34.3 % Fat Mass (lbs): 63.4 lbs Muscle Mass (lbs): 115.2 lbs Total Body Water (lbs): 81.4 lbs Visceral Fat Rating : 6   Other Clinical Data Today's Visit #: 19 Starting Date: 08/03/22 Comments: Cat 3     ASSESSMENT AND PLAN: Assessment & Plan Pure hypercholesterolemia Last LDL minimally elevated at 886 in July of this year.  HDL above goal at 59 and triglycerides at goal at 70.  Will continue dietary and lifestyle changes as well as usage of GLP-1 pharmacotherapy for further management.  Follow-up on labs at the end of January  beginning of February 2026. Obesity with starting BMI of 34.2  BMI 28.0-28.9,adult    Diet: Christabelle is currently in the action stage of change. As such, her goal is to continue with weight loss efforts and has agreed to practicing portion control and making smarter food choices, such as increasing vegetables and decreasing simple carbohydrates.   Exercise:  For substantial health benefits, adults should do at least 150 minutes (2 hours and 30 minutes) a week of moderate-intensity, or 75 minutes (1 hour and 15 minutes) a week of vigorous-intensity aerobic physical activity, or an equivalent combination of moderate- and vigorous-intensity aerobic activity. Aerobic activity should be performed in episodes of at least 10 minutes, and preferably, it should be spread throughout the week.  Behavior Modification:  We discussed the following Behavioral Modification Strategies today: increasing lean protein intake, decreasing simple carbohydrates, increasing vegetables, meal planning and cooking strategies, and holiday eating strategies.   Return in about 5 weeks (around 06/03/2024) for fasting labs.   She was informed of the importance of frequent follow up visits to maximize her success with intensive lifestyle modifications for her multiple health conditions.  Attestation Statements:   Reviewed by clinician on day of visit: allergies, medications, problem list, medical history, surgical history, family history, social history, and previous encounter notes.     Adelita Cho, MD

## 2024-05-14 ENCOUNTER — Other Ambulatory Visit (INDEPENDENT_AMBULATORY_CARE_PROVIDER_SITE_OTHER): Payer: Self-pay | Admitting: Family Medicine

## 2024-05-14 DIAGNOSIS — R635 Abnormal weight gain: Secondary | ICD-10-CM

## 2024-05-14 DIAGNOSIS — E66811 Obesity, class 1: Secondary | ICD-10-CM

## 2024-06-17 ENCOUNTER — Ambulatory Visit (INDEPENDENT_AMBULATORY_CARE_PROVIDER_SITE_OTHER): Admitting: Family Medicine

## 2024-06-17 VITALS — BP 108/74 | HR 91 | Temp 98.1°F | Ht 67.0 in | Wt 189.0 lb

## 2024-06-17 DIAGNOSIS — F32A Depression, unspecified: Secondary | ICD-10-CM | POA: Diagnosis not present

## 2024-06-17 DIAGNOSIS — E66811 Obesity, class 1: Secondary | ICD-10-CM

## 2024-06-17 DIAGNOSIS — F419 Anxiety disorder, unspecified: Secondary | ICD-10-CM

## 2024-06-17 DIAGNOSIS — R635 Abnormal weight gain: Secondary | ICD-10-CM

## 2024-06-17 DIAGNOSIS — Z6829 Body mass index (BMI) 29.0-29.9, adult: Secondary | ICD-10-CM | POA: Diagnosis not present

## 2024-06-17 MED ORDER — SEMAGLUTIDE (1 MG/DOSE) 4 MG/3ML ~~LOC~~ SOPN: 1.0000 mg | PEN_INJECTOR | SUBCUTANEOUS | 1 refills | Status: AC

## 2024-06-17 MED ORDER — BUPROPION HCL ER (XL) 150 MG PO TB24: 150.0000 mg | ORAL_TABLET | Freq: Every day | ORAL | 1 refills | Status: AC

## 2024-06-17 NOTE — Progress Notes (Signed)
 "  SUBJECTIVE:  Chief Complaint: Obesity  Interim History: Patient is currently crocheting a baby mobile for her brother's new baby. Last appointment prior to Thanksgiving.  She mentions her holidays were good- quiet and local.  She noticed something over the holidays- if she eats non nutritious food she doesn't feel well. What that looks like was eating out more frequently and eating more carbs than she did previously. Is considering elimination dieting and cut back on sugar, processed foods and overall carbs.  She changed her protein shakes recently and is drinking Fairlife protein shakes now.   Dominique Davis is here to discuss her progress with her obesity treatment plan. She is on the practicing portion control and making smarter food choices, such as increasing vegetables and decreasing simple carbohydrates and states she is following her eating plan approximately 50 % of the time. She states she is not exercising.   OBJECTIVE: Visit Diagnoses: Problem List Items Addressed This Visit       Other   Obesity   Relevant Medications   Semaglutide , 1 MG/DOSE, 4 MG/3ML SOPN   Abnormal weight gain - Primary   Relevant Medications   Semaglutide , 1 MG/DOSE, 4 MG/3ML SOPN   Anxiety and depression   Relevant Medications   buPROPion  (WELLBUTRIN  XL) 150 MG 24 hr tablet   Other Visit Diagnoses       BMI 29.0-29.9,adult           Vitals Temp: 98.1 F (36.7 C) BP: 108/74 Pulse Rate: 91 SpO2: 99 %   Anthropometric Measurements Height: 5' 7 (1.702 m) Weight: 189 lb (85.7 kg) BMI (Calculated): 29.59 Weight at Last Visit: 184 lb Weight Lost Since Last Visit: 0 Weight Gained Since Last Visit: 5 Starting Weight: 206 lb Total Weight Loss (lbs): 17 lb (7.711 kg)   Body Composition  Body Fat %: 36.8 % Fat Mass (lbs): 69.6 lbs Muscle Mass (lbs): 13.6 lbs Total Body Water (lbs): 83.6 lbs Visceral Fat Rating : 7   Other Clinical Data Today's Visit #: 20 Starting Date:  08/03/22 Comments: Cat 3     ASSESSMENT AND PLAN: Assessment & Plan Abnormal weight gain Reporting having to go back up to a prior dose of semaglutide  to get the same effects of control over the holiday season.  Refill of semaglutide  sent to pharmacy today.  Will follow-up at next appointment as to whether or not current dosage is assisting in managing intake as she works through elimination diet. Anxiety and depression Doing well with medication combinations.  Needs refill of bupropion  today.  No elevated blood pressure or side effect of significant dry mouth reported.  Refill sent to pharmacy BMI 29.0-29.9,adult  Class 1 obesity due to excess calories without serious comorbidity with body mass index (BMI) of 30.0 to 30.9 in adult    Diet: Dominique Davis is currently in the action stage of change. As such, her goal is to continue with weight loss efforts and has agreed to following a lower carbohydrate, vegetable and lean protein rich diet plan.   Exercise:  For substantial health benefits, adults should do at least 150 minutes (2 hours and 30 minutes) a week of moderate-intensity, or 75 minutes (1 hour and 15 minutes) a week of vigorous-intensity aerobic physical activity, or an equivalent combination of moderate- and vigorous-intensity aerobic activity. Aerobic activity should be performed in episodes of at least 10 minutes, and preferably, it should be spread throughout the week.  Patient to commit to going to water walk at least 2  days a week.   Behavior Modification:  We discussed the following Behavioral Modification Strategies today: increasing lean protein intake, decreasing simple carbohydrates, better snacking choices, emotional eating strategies , and avoiding temptations.   Return in about 4 weeks (around 07/15/2024).   She was informed of the importance of frequent follow up visits to maximize her success with intensive lifestyle modifications for her multiple health  conditions.  Attestation Statements:   Reviewed by clinician on day of visit: allergies, medications, problem list, medical history, surgical history, family history, social history, and previous encounter notes.     Dominique Cho, MD "

## 2024-06-26 NOTE — Assessment & Plan Note (Signed)
 Reporting having to go back up to a prior dose of semaglutide  to get the same effects of control over the holiday season.  Refill of semaglutide  sent to pharmacy today.  Will follow-up at next appointment as to whether or not current dosage is assisting in managing intake as she works through elimination diet.

## 2024-06-26 NOTE — Assessment & Plan Note (Signed)
 Doing well with medication combinations.  Needs refill of bupropion  today.  No elevated blood pressure or side effect of significant dry mouth reported.  Refill sent to pharmacy

## 2024-07-09 ENCOUNTER — Encounter: Payer: Self-pay | Admitting: Nurse Practitioner

## 2024-07-09 ENCOUNTER — Ambulatory Visit: Admitting: Nurse Practitioner

## 2024-07-09 VITALS — BP 108/82 | HR 82 | Temp 98.4°F | Ht 67.0 in | Wt 193.8 lb

## 2024-07-09 DIAGNOSIS — J0141 Acute recurrent pansinusitis: Secondary | ICD-10-CM

## 2024-07-09 MED ORDER — AMOXICILLIN-POT CLAVULANATE 875-125 MG PO TABS: 1.0000 | ORAL_TABLET | Freq: Two times a day (BID) | ORAL | 0 refills | Status: AC

## 2024-07-09 NOTE — Assessment & Plan Note (Signed)
 She has experienced sinus pressure, headaches, and nasal drainage for five days without fever, sore throat, shortness of breath, or ear pain. Previous sinus infections responded well to Augmentin  without gastrointestinal side effects. Prescribe Augmentin . Advise hydration and recommend Mucinex . Continue Flonase  and saline nasal rinses. Refer to ENT.

## 2024-07-11 ENCOUNTER — Ambulatory Visit: Admitting: Nurse Practitioner

## 2024-07-17 ENCOUNTER — Ambulatory Visit (INDEPENDENT_AMBULATORY_CARE_PROVIDER_SITE_OTHER): Admitting: Family Medicine

## 2024-12-17 ENCOUNTER — Encounter: Admitting: Nurse Practitioner
